# Patient Record
Sex: Female | Born: 1940 | Race: Black or African American | Hispanic: No | Smoking: Never smoker
Health system: Southern US, Community
[De-identification: ages and names within clinical notes are randomized; demographics above are authoritative.]

## PROBLEM LIST (undated history)

## (undated) DIAGNOSIS — C801 Malignant (primary) neoplasm, unspecified: Secondary | ICD-10-CM

## (undated) HISTORY — PX: COLON SURGERY: SHX602

---

## 2019-10-04 ENCOUNTER — Other Ambulatory Visit: Payer: Self-pay

## 2019-10-04 ENCOUNTER — Encounter (HOSPITAL_COMMUNITY): Payer: Self-pay | Admitting: *Deleted

## 2019-10-04 DIAGNOSIS — Z5321 Procedure and treatment not carried out due to patient leaving prior to being seen by health care provider: Secondary | ICD-10-CM | POA: Diagnosis not present

## 2019-10-04 DIAGNOSIS — R531 Weakness: Secondary | ICD-10-CM | POA: Insufficient documentation

## 2019-10-04 NOTE — ED Triage Notes (Signed)
Pt c/o weakness from her waist down, ongoing since Tuesday per pt.  Pt states she can stand for few minutes at a time before both legs give out. Pt denies any falls, states she just sits down on the floor.

## 2019-10-05 ENCOUNTER — Emergency Department (HOSPITAL_COMMUNITY): Payer: Medicare Other

## 2019-10-05 ENCOUNTER — Emergency Department (HOSPITAL_COMMUNITY)
Admission: EM | Admit: 2019-10-05 | Discharge: 2019-10-05 | Disposition: A | Discharge status: Home / Self Care | Payer: Medicare Other | Attending: Emergency Medicine | Admitting: Emergency Medicine

## 2019-10-05 ENCOUNTER — Observation Stay (HOSPITAL_COMMUNITY): Payer: Medicare Other

## 2019-10-05 ENCOUNTER — Other Ambulatory Visit: Payer: Self-pay

## 2019-10-05 ENCOUNTER — Encounter (HOSPITAL_COMMUNITY): Payer: Self-pay

## 2019-10-05 ENCOUNTER — Inpatient Hospital Stay (HOSPITAL_COMMUNITY)
Admission: EM | Admit: 2019-10-05 | Discharge: 2019-10-12 | DRG: 065 | Disposition: A | Discharge status: Rehabilitation Facility | Payer: Medicare Other | Attending: Internal Medicine | Admitting: Internal Medicine

## 2019-10-05 DIAGNOSIS — R262 Difficulty in walking, not elsewhere classified: Secondary | ICD-10-CM | POA: Diagnosis present

## 2019-10-05 DIAGNOSIS — Z9181 History of falling: Secondary | ICD-10-CM

## 2019-10-05 DIAGNOSIS — Z8673 Personal history of transient ischemic attack (TIA), and cerebral infarction without residual deficits: Secondary | ICD-10-CM

## 2019-10-05 DIAGNOSIS — R471 Dysarthria and anarthria: Secondary | ICD-10-CM | POA: Diagnosis present

## 2019-10-05 DIAGNOSIS — M4802 Spinal stenosis, cervical region: Secondary | ICD-10-CM | POA: Diagnosis present

## 2019-10-05 DIAGNOSIS — M160 Bilateral primary osteoarthritis of hip: Secondary | ICD-10-CM | POA: Diagnosis present

## 2019-10-05 DIAGNOSIS — Z85038 Personal history of other malignant neoplasm of large intestine: Secondary | ICD-10-CM

## 2019-10-05 DIAGNOSIS — F0390 Unspecified dementia without behavioral disturbance: Secondary | ICD-10-CM | POA: Diagnosis present

## 2019-10-05 DIAGNOSIS — R7303 Prediabetes: Secondary | ICD-10-CM | POA: Diagnosis present

## 2019-10-05 DIAGNOSIS — E785 Hyperlipidemia, unspecified: Secondary | ICD-10-CM | POA: Diagnosis present

## 2019-10-05 DIAGNOSIS — Z801 Family history of malignant neoplasm of trachea, bronchus and lung: Secondary | ICD-10-CM

## 2019-10-05 DIAGNOSIS — Z7982 Long term (current) use of aspirin: Secondary | ICD-10-CM

## 2019-10-05 DIAGNOSIS — I6381 Other cerebral infarction due to occlusion or stenosis of small artery: Secondary | ICD-10-CM | POA: Diagnosis not present

## 2019-10-05 DIAGNOSIS — M4854XA Collapsed vertebra, not elsewhere classified, thoracic region, initial encounter for fracture: Secondary | ICD-10-CM | POA: Diagnosis present

## 2019-10-05 DIAGNOSIS — E669 Obesity, unspecified: Secondary | ICD-10-CM | POA: Diagnosis present

## 2019-10-05 DIAGNOSIS — R29898 Other symptoms and signs involving the musculoskeletal system: Secondary | ICD-10-CM | POA: Diagnosis present

## 2019-10-05 DIAGNOSIS — I739 Peripheral vascular disease, unspecified: Secondary | ICD-10-CM | POA: Diagnosis present

## 2019-10-05 DIAGNOSIS — Z6830 Body mass index (BMI) 30.0-30.9, adult: Secondary | ICD-10-CM

## 2019-10-05 DIAGNOSIS — I10 Essential (primary) hypertension: Secondary | ICD-10-CM | POA: Diagnosis present

## 2019-10-05 DIAGNOSIS — K5900 Constipation, unspecified: Secondary | ICD-10-CM | POA: Diagnosis present

## 2019-10-05 DIAGNOSIS — M5136 Other intervertebral disc degeneration, lumbar region: Secondary | ICD-10-CM | POA: Diagnosis present

## 2019-10-05 DIAGNOSIS — Z720 Tobacco use: Secondary | ICD-10-CM

## 2019-10-05 DIAGNOSIS — R29810 Facial weakness: Secondary | ICD-10-CM | POA: Diagnosis present

## 2019-10-05 DIAGNOSIS — M47816 Spondylosis without myelopathy or radiculopathy, lumbar region: Secondary | ICD-10-CM | POA: Diagnosis present

## 2019-10-05 DIAGNOSIS — R531 Weakness: Secondary | ICD-10-CM | POA: Diagnosis present

## 2019-10-05 DIAGNOSIS — E049 Nontoxic goiter, unspecified: Secondary | ICD-10-CM | POA: Diagnosis present

## 2019-10-05 DIAGNOSIS — M48061 Spinal stenosis, lumbar region without neurogenic claudication: Secondary | ICD-10-CM | POA: Diagnosis present

## 2019-10-05 DIAGNOSIS — R52 Pain, unspecified: Secondary | ICD-10-CM

## 2019-10-05 DIAGNOSIS — R29702 NIHSS score 2: Secondary | ICD-10-CM | POA: Diagnosis present

## 2019-10-05 DIAGNOSIS — E079 Disorder of thyroid, unspecified: Secondary | ICD-10-CM

## 2019-10-05 DIAGNOSIS — I6523 Occlusion and stenosis of bilateral carotid arteries: Secondary | ICD-10-CM | POA: Diagnosis present

## 2019-10-05 DIAGNOSIS — R278 Other lack of coordination: Secondary | ICD-10-CM | POA: Diagnosis present

## 2019-10-05 DIAGNOSIS — Z20822 Contact with and (suspected) exposure to covid-19: Secondary | ICD-10-CM | POA: Diagnosis present

## 2019-10-05 HISTORY — DX: Malignant (primary) neoplasm, unspecified: C80.1

## 2019-10-05 LAB — HEPATIC FUNCTION PANEL
ALT: 16 U/L (ref 0–44)
AST: 21 U/L (ref 15–41)
Albumin: 4.1 g/dL (ref 3.5–5.0)
Alkaline Phosphatase: 67 U/L (ref 38–126)
Bilirubin, Direct: 0.1 mg/dL (ref 0.0–0.2)
Indirect Bilirubin: 0.4 mg/dL (ref 0.3–0.9)
Total Bilirubin: 0.5 mg/dL (ref 0.3–1.2)
Total Protein: 7.7 g/dL (ref 6.5–8.1)

## 2019-10-05 LAB — CBC WITH DIFFERENTIAL/PLATELET
Abs Immature Granulocytes: 0.02 10*3/uL (ref 0.00–0.07)
Basophils Absolute: 0 10*3/uL (ref 0.0–0.1)
Basophils Relative: 1 %
Eosinophils Absolute: 0.1 10*3/uL (ref 0.0–0.5)
Eosinophils Relative: 1 %
HCT: 42.2 % (ref 36.0–46.0)
Hemoglobin: 13.4 g/dL (ref 12.0–15.0)
Immature Granulocytes: 0 %
Lymphocytes Relative: 34 %
Lymphs Abs: 2.8 10*3/uL (ref 0.7–4.0)
MCH: 30.9 pg (ref 26.0–34.0)
MCHC: 31.8 g/dL (ref 30.0–36.0)
MCV: 97.5 fL (ref 80.0–100.0)
Monocytes Absolute: 0.9 10*3/uL (ref 0.1–1.0)
Monocytes Relative: 11 %
Neutro Abs: 4.5 10*3/uL (ref 1.7–7.7)
Neutrophils Relative %: 53 %
Platelets: 189 10*3/uL (ref 150–400)
RBC: 4.33 MIL/uL (ref 3.87–5.11)
RDW: 13.1 % (ref 11.5–15.5)
WBC: 8.4 10*3/uL (ref 4.0–10.5)
nRBC: 0 % (ref 0.0–0.2)

## 2019-10-05 LAB — URINALYSIS, ROUTINE W REFLEX MICROSCOPIC
Bilirubin Urine: NEGATIVE
Glucose, UA: NEGATIVE mg/dL
Hgb urine dipstick: NEGATIVE
Ketones, ur: NEGATIVE mg/dL
Leukocytes,Ua: NEGATIVE
Nitrite: NEGATIVE
Protein, ur: NEGATIVE mg/dL
Specific Gravity, Urine: 1.01 (ref 1.005–1.030)
pH: 5 (ref 5.0–8.0)

## 2019-10-05 LAB — BASIC METABOLIC PANEL
Anion gap: 11 (ref 5–15)
BUN: 14 mg/dL (ref 8–23)
CO2: 22 mmol/L (ref 22–32)
Calcium: 9.5 mg/dL (ref 8.9–10.3)
Chloride: 104 mmol/L (ref 98–111)
Creatinine, Ser: 0.93 mg/dL (ref 0.44–1.00)
GFR calc Af Amer: >60 mL/min (ref >60)
GFR calc non Af Amer: 59 mL/min — ABNORMAL LOW (ref >60)
Glucose, Bld: 95 mg/dL (ref 70–99)
Potassium: 4 mmol/L (ref 3.5–5.1)
Sodium: 137 mmol/L (ref 135–145)

## 2019-10-05 LAB — SARS CORONAVIRUS 2 BY RT PCR (HOSPITAL ORDER, PERFORMED IN ~~LOC~~ HOSPITAL LAB): SARS Coronavirus 2: NEGATIVE

## 2019-10-05 MED ORDER — SODIUM CHLORIDE 0.9% FLUSH: 3.0000 mL | INTRAVENOUS | Status: DC | PRN

## 2019-10-05 MED ORDER — SODIUM CHLORIDE 0.9 % IV SOLN: 250.0000 mL | INTRAVENOUS | Status: DC | PRN

## 2019-10-05 MED ORDER — IOHEXOL 300 MG/ML  SOLN: 100.0000 mL | Freq: Once | INTRAMUSCULAR | Status: DC | PRN

## 2019-10-05 MED ORDER — ONDANSETRON HCL 4 MG PO TABS: 4.0000 mg | ORAL_TABLET | Freq: Four times a day (QID) | ORAL | Status: DC | PRN

## 2019-10-05 MED ORDER — SODIUM CHLORIDE 0.9% FLUSH
3.0000 mL | Freq: Two times a day (BID) | INTRAVENOUS | Status: DC
  Administered 2019-10-06 – 2019-10-11 (×12): 3 mL via INTRAVENOUS

## 2019-10-05 MED ORDER — BISACODYL 10 MG RE SUPP: 10.0000 mg | Freq: Every day | RECTAL | Status: DC | PRN

## 2019-10-05 MED ORDER — ASPIRIN EC 81 MG PO TBEC
81.0000 mg | DELAYED_RELEASE_TABLET | Freq: Every day | ORAL | Status: DC
  Administered 2019-10-06 – 2019-10-12 (×7): 81 mg via ORAL
  Filled 2019-10-05 (×7): qty 1

## 2019-10-05 MED ORDER — ONDANSETRON HCL 4 MG/2ML IJ SOLN: 4.0000 mg | Freq: Four times a day (QID) | INTRAMUSCULAR | Status: DC | PRN

## 2019-10-05 MED ORDER — LACTATED RINGERS IV SOLN: INTRAVENOUS | Status: DC

## 2019-10-05 MED ORDER — ENOXAPARIN SODIUM 40 MG/0.4ML ~~LOC~~ SOLN
40.0000 mg | SUBCUTANEOUS | Status: DC
  Administered 2019-10-06 – 2019-10-11 (×6): 40 mg via SUBCUTANEOUS
  Filled 2019-10-05 (×7): qty 0.4

## 2019-10-05 MED ORDER — POLYETHYLENE GLYCOL 3350 17 G PO PACK: 17.0000 g | PACK | Freq: Every day | ORAL | Status: DC | PRN

## 2019-10-05 NOTE — Consult Note (Signed)
[facility] Tele[facility]urology Consult Services  Stat Consult  Date of Service:   10/05/2019 18:09:48  Impression:     .  R53.1 - Weakness  Comments/Sign-Out: Patient is 78yoF with no significant hx who p/w left leg weakness s/p fall. On exam her left leg is much weaker with some effort against gravity. No sensory deficits. Upper limbs appear to be intact. MRI brain negative for CVA or other central pathology. Possible lumbar spine compromise s/p fall.  CT HEAD: Showed No Acute Hemorrhage or Acute Core Infarct  Metrics: [facility] Notification Time: 10/05/2019 18:08:23 Stamp Time: 10/05/2019 18:09:48 Callback Response Time: 10/05/2019 18:15:00  Our recommendations are outlined below.  Recommendations:     Marland Kitchen  MRI Lumbar spine   Therapies:     .  Physical Therapy, Occupational Therapy, Speech Therapy Assessment When Applicable  Other WorkUp:     .  Infectious/metabolic workup per primary team     .  Check B12 level     .  Check TSH  Disposition: Neurology Follow Up Recommended  Sign Out:     .  Discussed with Emergency Department Provider  ----------------------------------------------------------------------------------------------------  Chief Complaint: Left leg weakness  History of Present Illness: Patient is a 79 year old Female.  Patient is 78yoF with no significant hx who p/w left leg weakness s/p fall. Patient reports she had a fall about two weeks ago. She recall standing in the kitchen and suddenly her legs gave out. She fell backwards on the back. She did not hit her head or had LOC. Patient reports she did not seek medical attention at that time. Tuesday morning she got out of washing her face and suddenly her leg gave out again. She reports she could not stand up. She had to call her daughter to help her. For the next day she used walker to ambulate. She came to hospital yesterday for evaluation since she felt her legs were weak , mostly  the left leg.   On my exam she is awake and alert following commands. She reports pain in her shoulder blade area but no neck pain. She denied numbness and tingling. No weakness of her arms. Patient denied similar symptoms in the past.   Anticoagulant use:  No  Antiplatelet use: ASA 81mg     Examination: BP(164/74), Pulse(64), Blood Glucose(95) 1A: Level of Consciousness - Alert; keenly responsive + 0 1B: Ask Month and Age - Both Questions Right + 0 1C: Blink Eyes & Squeeze Hands - Performs Both Tasks + 0 2: Test Horizontal Extraocular Movements - Normal + 0 3: Test Visual Fields - No Visual Loss + 0 4: Test Facial Palsy (Use Grimace if Obtunded) - Normal symmetry + 0 5A: Test Left Arm Motor Drift - No Drift for 10 Seconds + 0 5B: Test Right Arm Motor Drift - No Drift for 10 Seconds + 0 6A: Test Left Leg Motor Drift - Some Effort Against Gravity + 2 6B: Test Right Leg Motor Drift - No Drift for 5 Seconds + 0 7: Test Limb Ataxia (FNF/Heel-Shin) - No Ataxia + 0 8: Test Sensation - Normal; No sensory loss + 0 9: Test Language/Aphasia - Normal; No aphasia + 0 10: Test Dysarthria - Normal + 0 11: Test Extinction/Inattention - No abnormality + 0  NIHSS Score: 2   Patient/Family was informed the Neurology Consult would occur via TeleHealth consult by way of interactive audio and video telecommunications and consented to receiving care in this manner.  Patient is being evaluated for  possible acute neurologic impairment and high probability of imminent or life-threatening deterioration. I spent total of 35 minutes providing care to this patient, including time for face to face visit via telemedicine, review of medical records, imaging studies and discussion of findings with providers, the patient and/or family.   Dr Beryl Meager   [facility] [phone]  Case [id number]

## 2019-10-05 NOTE — ED Triage Notes (Signed)
Pt reports weakness from her waist down, ongoing since since Tuesday. Pt states her legs give out when she tries to stand and and she goes to her knees and sits on floor. No in left arm and hand which has been increasingly worse

## 2019-10-05 NOTE — H&P (Signed)
History and Physical    Patient DemographicsLori Holt ZOX:[id number] DOB: 1941/01/13 DOA: 10/05/2019  PCP: Patient, No Pcp Per  Patient coming from: Home  I have personally briefly reviewed patient's old medical records in [place]  Chief Complaint: LLE weakness   Assessment & Plan:     Assessment/Plan Principal Problem:   Weakness of left lower extremity     Principal Problem: Left lower extremity weakness Patient reports having 4-day history of worsening left lower extremity weakness with difficulty in ambulation.  She did have a fall about 2 weeks ago in the kitchen and fell backwards on her back but did not sustain any injury, loss of consciousness.  She did not seek medical attention at the time as she was able to ambulate normally following that fall until about 4 days ago.  Had another episode when her leg suddenly gave out on Tuesday morning.  No known history of lumbar spinal disease or cerebrovascular disease.  Work-up in the ER showed normal labs, normal CT of the head as well as MRI of the brain.  Patient was seen by teleneurology and was suspected to have lumbar spinal pathology and an MRI of the lumbar spine was recommended.  MRI is currently not available at this facility and patient is being transferred to [facility] when bed is available.   -Telemetry monitoring -Neuro checks every 4 hours -Will need neurology consult on transfer to [facility] -We will plan for MRI of the lumbar spine on transfer -We will obtain a CT of the lumbar and thoracic spine to rule out fractures or other pathology as patient has some tenderness in lower thoracic region  Other Active Problems: History of Colon Cancer Patient reports a history of colon cancer in the past requiring surgical resection about 5 to 6 years ago.  Did not receive any chemo or radiation.  Does not know further details about the cancer.   DVT prophylaxis: Lovenox Code Status:  Full  code Family Communication: N/A  Disposition Plan: Placed in observation for weakness, will need MRI lumbar spine   Consults called: N/A Admission status: Observation stay    HPI:     HPI: Melissa Holt is a 79 y.o. female with no significant past medical history who presented to the ER with left lower extremity weakness.  Patient reported having weakness from her waist down ongoing since Tuesday.  She had difficulty with ambulation.  Patient says she has been feeling like her legs give out when she tries to stand.  Patient did report having a fall about 2 weeks ago at home and she was standing in the kitchen when her legs gave out and she fell backwards.  No head injury, loss of consciousness reported.  No significant back pain at the time.  Patient was able to ambulate normally until about 4 days ago.  Patient was apparently noted to have some left upper extremity weakness on exam by the ER physician.  A work-up for stroke was initiated including CT of the head as well as MRI which was negative.  Patient was seen by teleneurology and was suspected to have lumbar spinal pathology and an MRI of the lumbar spine was recommended. ED Course:  Vital Signs reviewed on presentation, significant for temperature 98.7, heart rate 70, blood pressure 159/76, saturation 99% on room air. Labs reviewed, significant for sodium 137 potassium 4.0, BUN 14, creatinine 0.9, LFTs within normal limits, WBC count 8.4, globin 13.4, hematocrit 42, platelets  189.  Urinalysis is negative. Imaging personally Reviewed, CT of the head showed no acute findings.  MRI brain showed moderate atrophy with chronic microvascular ischemic change but no acute hemorrhage, stroke.  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 EKG personally reviewed, shows sinus rhythm, left anterior fascicular block.    Review of systems:    Review of Systems: As per HPI otherwise 10 point review of systems negative.  All other review of systems  is negative except the ones noted above in the HPI.    Past Medical and Surgical History:  Reviewed by me  Past Medical History:  Diagnosis Date  . Cancer [facility])     Past Surgical History:  Procedure Laterality Date  . COLON SURGERY       Social History:  Reviewed by me   reports that she has never smoked. Her smokeless tobacco use includes snuff. She reports current alcohol use. She reports that she does not use drugs.  Allergies:    No Known Allergies  Family History :   No family history on file. Family history reviewed, noted as above, not pertinent to current presentation.   Home Medications:    Prior to Admission medications   Not on File    Physical Exam:    Physical Exam: Vitals:   10/05/19 1800 10/05/19 1830 10/05/19 1900 10/05/19 2000  BP: (!) 164/74 (!) 162/117 (!) 164/80 (!) 165/88  Pulse: 64 87 64 65  Resp: 20     Temp:      SpO2: 97% 97% 97% 98%  Weight:      Height:        Constitutional: NAD, calm, comfortable Vitals:   10/05/19 1800 10/05/19 1830 10/05/19 1900 10/05/19 2000  BP: (!) 164/74 (!) 162/117 (!) 164/80 (!) 165/88  Pulse: 64 87 64 65  Resp: 20     Temp:      SpO2: 97% 97% 97% 98%  Weight:      Height:       Eyes: PERRL, lids and conjunctivae normal ENMT: Mucous membranes are moist. Posterior pharynx clear of any exudate or lesions.Normal dentition.  Neck: normal, supple, no masses, no thyromegaly Respiratory: clear to auscultation bilaterally, no wheezing, no crackles. Normal respiratory effort. No accessory muscle use.  Cardiovascular: Regular rate and rhythm, no murmurs / rubs / gallops. No extremity edema. 2+ pedal pulses. No carotid bruits.  Abdomen: no tenderness, no masses palpated. No hepatosplenomegaly. Bowel sounds positive.  Musculoskeletal: no clubbing / cyanosis. No joint deformity upper and lower extremities. Good ROM, no contractures. Normal muscle tone.  Skin: no rashes, lesions, ulcers. No  induration Neurologic: CN 2-12 grossly intact. Sensation intact, DTR normal.  Power is 3/5 in left lower extremity.  5/5 in rest of the extremities. Psychiatric: Normal judgment and insight. Alert and oriented x 3. Normal mood.    Decubitus Ulcers: Not present on admission Catheters and tubes: None  Data Review:    Labs on Admission: I have personally reviewed following labs and imaging studies  CBC: Recent Labs  Lab 10/05/19 1455  WBC 8.4  NEUTROABS 4.5  HGB 13.4  HCT 42.2  MCV 97.5  PLT 161   Basic Metabolic Panel: Recent Labs  Lab 10/05/19 1455  NA 137  K 4.0  CL 104  CO2 22  GLUCOSE 95  BUN 14  CREATININE 0.93  CALCIUM 9.5   GFR: Estimated Creatinine Clearance: 41.8 mL/min (by C-G formula based on SCr of 0.93 mg/dL). Liver Function Tests:  Recent Labs  Lab 10/05/19 1455  AST 21  ALT 16  ALKPHOS 67  BILITOT 0.5  PROT 7.7  ALBUMIN 4.1   No results for input(s): LIPASE, AMYLASE in the last 168 hours. No results for input(s): AMMONIA in the last 168 hours. Coagulation Profile: No results for input(s): INR, PROTIME in the last 168 hours. Cardiac Enzymes: No results for input(s): CKTOTAL, CKMB, CKMBINDEX, TROPONINI in the last 168 hours. BNP (last 3 results) No results for input(s): PROBNP in the last 8760 hours. HbA1C: No results for input(s): HGBA1C in the last 72 hours. CBG: No results for input(s): GLUCAP in the last 168 hours. Lipid Profile: No results for input(s): CHOL, HDL, LDLCALC, TRIG, CHOLHDL, LDLDIRECT in the last 72 hours. Thyroid Function Tests: No results for input(s): TSH, T4TOTAL, FREET4, T3FREE, THYROIDAB in the last 72 hours. Anemia Panel: No results for input(s): VITAMINB12, FOLATE, FERRITIN, TIBC, IRON, RETICCTPCT in the last 72 hours. Urine analysis:    Component Value Date/Time   COLORURINE YELLOW 10/05/2019 1600   APPEARANCEUR CLEAR 10/05/2019 1600   LABSPEC 1.010 10/05/2019 1600   PHURINE 5.0 10/05/2019 1600   GLUCOSEU  NEGATIVE 10/05/2019 1600   HGBUR NEGATIVE 10/05/2019 1600   BILIRUBINUR NEGATIVE 10/05/2019 1600   KETONESUR NEGATIVE 10/05/2019 1600   PROTEINUR NEGATIVE 10/05/2019 1600   NITRITE NEGATIVE 10/05/2019 1600   LEUKOCYTESUR NEGATIVE 10/05/2019 1600     Imaging Results:      Radiological Exams on Admission: CT Head Wo Contrast  Result Date: 10/05/2019 CLINICAL DATA:  Weakness from the waist down beginning 3 days ago. EXAM: CT HEAD WITHOUT CONTRAST TECHNIQUE: Contiguous axial images were obtained from the base of the skull through the vertex without intravenous contrast. COMPARISON:  None. FINDINGS: Brain: Age related atrophy. Chronic small-vessel ischemic changes of the cerebral hemispheric white matter. No sign of acute infarction, mass lesion, hemorrhage, hydrocephalus or extra-axial collection. Vascular: There is atherosclerotic calcification of the major vessels at the base of the brain. Skull: Negative Sinuses/Orbits: Clear/normal Other: None IMPRESSION: No acute finding by CT. Age related atrophy and chronic small-vessel ischemic changes of the white matter. Electronically Signed   By: Nelson Chimes M.D.   On: 10/05/2019 16:22   MR BRAIN WO CONTRAST  Result Date: 10/05/2019 CLINICAL DATA:  Acute neuro deficit.  Bilateral leg weakness. EXAM: MRI HEAD WITHOUT CONTRAST TECHNIQUE: Multiplanar, multiecho pulse sequences of the brain and surrounding structures were obtained without intravenous contrast. COMPARISON:  CT head 10/05/2019 FINDINGS: Brain: Moderate atrophy. Ventricular enlargement consistent with atrophy. Negative for acute infarct. Moderate chronic microvascular ischemic changes in the white matter. 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 Negative for hemorrhage or mass. Vascular: Loss of flow void distal right vertebral artery which may be small or occluded. Otherwise normal arterial flow voids Skull and upper cervical spine: No focal skeletal lesion. Degenerative changes in the  cervical spine. Sinuses/Orbits: Mild mucosal edema paranasal sinuses. Mastoid clear bilaterally Negative orbit Other: None IMPRESSION: No acute abnormality Moderate atrophy and moderate chronic microvascular ischemic changes. Electronically Signed   By: Franchot Gallo M.D.   On: 10/05/2019 17:28      Arnold Depinto Ginette Otto MD [facility]  If 7PM-7AM, please contact night-coverage   10/05/2019, 8:25 PM

## 2019-10-05 NOTE — ED Provider Notes (Signed)
[facility] EMERGENCY DEPARTMENT Provider Note   CSN: [id number] Arrival date & time: 10/05/19  1126     History Chief Complaint  Patient presents with  . Extremity Weakness    Melissa Holt is a 79 y.o. female.  On Tuesday the patient states she fell and was weak in her left leg.  She also has some weakness in her left arm  The history is provided by the patient, medical records and a significant other. No language interpreter was used.  Extremity Weakness This is a new problem. The current episode started more than 2 days ago. The problem occurs constantly. The problem has not changed since onset.Pertinent negatives include no chest pain, no abdominal pain and no headaches. Nothing aggravates the symptoms. Nothing relieves the symptoms. She has tried nothing for the symptoms.       Past Medical History:  Diagnosis Date  . Cancer ([place])     There are no problems to display for this patient.   Past Surgical History:  Procedure Laterality Date  . COLON SURGERY       OB History   No obstetric history on file.     No family history on file.  Social History   Tobacco Use  . Smoking status: Never Smoker  . Smokeless tobacco: Current User    Types: Snuff  Substance Use Topics  . Alcohol use: Yes  . Drug use: Never    Home Medications Prior to Admission medications   Not on File    Allergies    Patient has no known allergies.  Review of Systems   Review of Systems  Constitutional: Negative for appetite change and fatigue.  HENT: Negative for congestion, ear discharge and sinus pressure.   Eyes: Negative for discharge.  Respiratory: Negative for cough.   Cardiovascular: Negative for chest pain.  Gastrointestinal: Negative for abdominal pain and diarrhea.  Genitourinary: Negative for frequency and hematuria.  Musculoskeletal: Positive for extremity weakness. Negative for back pain.       Weakness left leg left arm  Skin: Negative for rash.    Neurological: Negative for seizures and headaches.  Psychiatric/Behavioral: Negative for hallucinations.    Physical Exam Updated Vital Signs BP (!) 165/88   Pulse 65   Temp 98.7 F (37.1 C)   Resp 20   Ht 4\' 11"  (1.499 m)   Wt 68 kg   SpO2 98%   BMI 30.28 kg/m   Physical Exam Vitals and nursing note reviewed.  Constitutional:      Appearance: She is well-developed.  HENT:     Head: Normocephalic.     Nose: Nose normal.  Eyes:     General: No scleral icterus.    Conjunctiva/sclera: Conjunctivae normal.  Neck:     Thyroid: No thyromegaly.  Cardiovascular:     Rate and Rhythm: Normal rate and regular rhythm.     Heart sounds: No murmur heard.  No friction rub. No gallop.   Pulmonary:     Breath sounds: No stridor. No wheezing or rales.  Chest:     Chest wall: No tenderness.  Abdominal:     General: There is no distension.     Tenderness: There is no abdominal tenderness. There is no rebound.  Musculoskeletal:        General: Normal range of motion.     Cervical back: Neck supple.     Comments: Patient has profound weakness to left leg and minimal dexterity problems to left hand and arm  Lymphadenopathy:     Cervical: No cervical adenopathy.  Skin:    Findings: No erythema or rash.  Neurological:     Mental Status: She is alert and oriented to person, place, and time.     Motor: No abnormal muscle tone.     Coordination: Coordination normal.  Psychiatric:        Behavior: Behavior normal.     ED Results / Procedures / Treatments   Labs (all labs ordered are listed, but only abnormal results are displayed) Labs Reviewed  BASIC METABOLIC PANEL - Abnormal; Notable for the following components:      Result Value   GFR calc non Af Amer 59 (*)    All other components within normal limits  SARS CORONAVIRUS 2 BY RT PCR (HOSPITAL ORDER, [place] [facility])  CBC WITH DIFFERENTIAL/PLATELET  URINALYSIS, ROUTINE W REFLEX MICROSCOPIC  HEPATIC  FUNCTION PANEL    EKG None  Radiology CT Head Wo Contrast  Result Date: 10/05/2019 CLINICAL DATA:  Weakness from the waist down beginning 3 days ago. EXAM: CT HEAD WITHOUT CONTRAST TECHNIQUE: Contiguous axial images were obtained from the base of the skull through the vertex without intravenous contrast. COMPARISON:  None. FINDINGS: Brain: Age related atrophy. Chronic small-vessel ischemic changes of the cerebral hemispheric white matter. No sign of acute infarction, mass lesion, hemorrhage, hydrocephalus or extra-axial collection. Vascular: There is atherosclerotic calcification of the major vessels at the base of the brain. Skull: Negative Sinuses/Orbits: Clear/normal Other: None IMPRESSION: No acute finding by CT. Age related atrophy and chronic small-vessel ischemic changes of the white matter. Electronically Signed   By: Nelson Chimes M.D.   On: 10/05/2019 16:22   MR BRAIN WO CONTRAST  Result Date: 10/05/2019 CLINICAL DATA:  Acute neuro deficit.  Bilateral leg weakness. EXAM: MRI HEAD WITHOUT CONTRAST TECHNIQUE: Multiplanar, multiecho pulse sequences of the brain and surrounding structures were obtained without intravenous contrast. COMPARISON:  CT head 10/05/2019 FINDINGS: Brain: Moderate atrophy. Ventricular enlargement consistent with atrophy. Negative for acute infarct. Moderate chronic microvascular ischemic changes in the white matter. 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 Negative for hemorrhage or mass. Vascular: Loss of flow void distal right vertebral artery which may be small or occluded. Otherwise normal arterial flow voids Skull and upper cervical spine: No focal skeletal lesion. Degenerative changes in the cervical spine. Sinuses/Orbits: Mild mucosal edema paranasal sinuses. Mastoid clear bilaterally Negative orbit Other: None IMPRESSION: No acute abnormality Moderate atrophy and moderate chronic microvascular ischemic changes. Electronically Signed   By: Franchot Gallo M.D.    On: 10/05/2019 17:28    Procedures Procedures (including critical care time)  Medications Ordered in ED Medications - No data to display  ED Course  I have reviewed the triage vital signs and the nursing notes.  Pertinent labs & imaging results that were available during my care of the patient were reviewed by me and considered in my medical decision making (see chart for details). CRITICAL CARE Performed by: Milton Ferguson Total critical care time: 40 minutes Critical care time was exclusive of separately billable procedures and treating other patients. Critical care was necessary to treat or prevent imminent or life-threatening deterioration. Critical care was time spent personally by me on the following activities: development of treatment plan with patient and/or surrogate as well as nursing, discussions with consultants, evaluation of patient's response to treatment, examination of patient, obtaining history from patient or surrogate, ordering and performing treatments and interventions, ordering and review of laboratory  studies, ordering and review of radiographic studies, pulse oximetry and re-evaluation of patient's condition.   Patient seen by neurology.  They recommend MRI lumbar spine and admission MDM Rules/Calculators/A&P                         Patient with weakness to left leg unknown cause.  She will be admitted and get an MRI of lumbar spine       This patient presents to the ED for concern of left leg weakness, this involves an extensive number of treatment options, and is a complaint that carries with it a high risk of complications and morbidity.  The differential diagnosis includes stroke tumor in spine   Lab Tests:   I Ordered, reviewed, and interpreted labs, which included CBC and chemistries unremarkable  Medicines ordered:   I ordered medication normal saline for dehydration  Imaging Studies ordered:   I ordered imaging studies which included CT  head and MRI head  I independently visualized and interpreted imaging which showed unremarkable  Additional history obtained:   Additional history obtained from family  Previous records obtained and reviewed.  Consultations Obtained:   I consulted neurology and hospitalist and discussed lab and imaging findings  Reevaluation:  After the interventions stated above, I reevaluated the patient and found no change  Critical Interventions:  .    Final Clinical Impression(s) / ED Diagnoses Final diagnoses:  None    Rx / DC Orders ED Discharge Orders    None       Milton Ferguson, MD 10/05/19 2026

## 2019-10-06 ENCOUNTER — Encounter (HOSPITAL_COMMUNITY): Payer: Self-pay | Admitting: Internal Medicine

## 2019-10-06 ENCOUNTER — Observation Stay (HOSPITAL_COMMUNITY): Payer: Medicare Other

## 2019-10-06 DIAGNOSIS — M4802 Spinal stenosis, cervical region: Secondary | ICD-10-CM | POA: Diagnosis not present

## 2019-10-06 DIAGNOSIS — R29810 Facial weakness: Secondary | ICD-10-CM | POA: Diagnosis not present

## 2019-10-06 DIAGNOSIS — M4854XA Collapsed vertebra, not elsewhere classified, thoracic region, initial encounter for fracture: Secondary | ICD-10-CM | POA: Diagnosis not present

## 2019-10-06 DIAGNOSIS — I6523 Occlusion and stenosis of bilateral carotid arteries: Secondary | ICD-10-CM | POA: Diagnosis not present

## 2019-10-06 DIAGNOSIS — R471 Dysarthria and anarthria: Secondary | ICD-10-CM | POA: Diagnosis not present

## 2019-10-06 DIAGNOSIS — M47816 Spondylosis without myelopathy or radiculopathy, lumbar region: Secondary | ICD-10-CM | POA: Diagnosis not present

## 2019-10-06 DIAGNOSIS — E785 Hyperlipidemia, unspecified: Secondary | ICD-10-CM | POA: Diagnosis not present

## 2019-10-06 DIAGNOSIS — M48061 Spinal stenosis, lumbar region without neurogenic claudication: Secondary | ICD-10-CM | POA: Diagnosis not present

## 2019-10-06 DIAGNOSIS — I340 Nonrheumatic mitral (valve) insufficiency: Secondary | ICD-10-CM

## 2019-10-06 DIAGNOSIS — K5900 Constipation, unspecified: Secondary | ICD-10-CM | POA: Diagnosis not present

## 2019-10-06 DIAGNOSIS — Z85038 Personal history of other malignant neoplasm of large intestine: Secondary | ICD-10-CM | POA: Diagnosis not present

## 2019-10-06 DIAGNOSIS — Z720 Tobacco use: Secondary | ICD-10-CM | POA: Diagnosis not present

## 2019-10-06 DIAGNOSIS — I6381 Other cerebral infarction due to occlusion or stenosis of small artery: Secondary | ICD-10-CM | POA: Diagnosis not present

## 2019-10-06 DIAGNOSIS — R29898 Other symptoms and signs involving the musculoskeletal system: Secondary | ICD-10-CM | POA: Diagnosis not present

## 2019-10-06 DIAGNOSIS — Z20822 Contact with and (suspected) exposure to covid-19: Secondary | ICD-10-CM | POA: Diagnosis not present

## 2019-10-06 DIAGNOSIS — R7303 Prediabetes: Secondary | ICD-10-CM | POA: Diagnosis not present

## 2019-10-06 DIAGNOSIS — E669 Obesity, unspecified: Secondary | ICD-10-CM | POA: Diagnosis not present

## 2019-10-06 DIAGNOSIS — Z6830 Body mass index (BMI) 30.0-30.9, adult: Secondary | ICD-10-CM | POA: Diagnosis not present

## 2019-10-06 DIAGNOSIS — F0390 Unspecified dementia without behavioral disturbance: Secondary | ICD-10-CM | POA: Diagnosis not present

## 2019-10-06 DIAGNOSIS — R29702 NIHSS score 2: Secondary | ICD-10-CM | POA: Diagnosis not present

## 2019-10-06 DIAGNOSIS — R531 Weakness: Secondary | ICD-10-CM | POA: Diagnosis present

## 2019-10-06 DIAGNOSIS — M160 Bilateral primary osteoarthritis of hip: Secondary | ICD-10-CM | POA: Diagnosis not present

## 2019-10-06 DIAGNOSIS — I739 Peripheral vascular disease, unspecified: Secondary | ICD-10-CM | POA: Diagnosis not present

## 2019-10-06 DIAGNOSIS — M5136 Other intervertebral disc degeneration, lumbar region: Secondary | ICD-10-CM | POA: Diagnosis not present

## 2019-10-06 DIAGNOSIS — I10 Essential (primary) hypertension: Secondary | ICD-10-CM | POA: Diagnosis not present

## 2019-10-06 DIAGNOSIS — E049 Nontoxic goiter, unspecified: Secondary | ICD-10-CM | POA: Diagnosis not present

## 2019-10-06 LAB — LIPID PANEL
Cholesterol: 272 mg/dL — ABNORMAL HIGH (ref 0–200)
HDL: 67 mg/dL (ref >40)
LDL Cholesterol: 186 mg/dL — ABNORMAL HIGH (ref 0–99)
Total CHOL/HDL Ratio: 4.1 RATIO
Triglycerides: 93 mg/dL (ref <150)
VLDL: 19 mg/dL (ref 0–40)

## 2019-10-06 LAB — ECHOCARDIOGRAM COMPLETE
Area-P 1/2: 3.48 cm2
Height: 59 in
S' Lateral: 3 cm
Weight: 2398.6 oz

## 2019-10-06 MED ORDER — AMLODIPINE BESYLATE 5 MG PO TABS
5.0000 mg | ORAL_TABLET | Freq: Every day | ORAL | Status: DC
  Administered 2019-10-06 – 2019-10-12 (×7): 5 mg via ORAL
  Filled 2019-10-06 (×7): qty 1

## 2019-10-06 MED ORDER — LORAZEPAM 1 MG PO TABS: 1.0000 mg | ORAL_TABLET | ORAL | Status: DC | PRN

## 2019-10-06 MED ORDER — IOHEXOL 300 MG/ML  SOLN
100.0000 mL | Freq: Once | INTRAMUSCULAR | Status: AC | PRN
  Administered 2019-10-06: 100 mL via INTRAVENOUS

## 2019-10-06 MED ORDER — LABETALOL HCL 5 MG/ML IV SOLN: 10.0000 mg | INTRAVENOUS | Status: DC | PRN

## 2019-10-06 MED ORDER — ATORVASTATIN CALCIUM 10 MG PO TABS
20.0000 mg | ORAL_TABLET | Freq: Every day | ORAL | Status: DC
  Administered 2019-10-06 – 2019-10-08 (×3): 20 mg via ORAL
  Filled 2019-10-06 (×3): qty 2

## 2019-10-06 NOTE — Progress Notes (Signed)
  Echocardiogram 2D Echocardiogram has been performed.  Johny Chess 10/06/2019, 4:41 PM

## 2019-10-06 NOTE — Progress Notes (Signed)
Notified Dr. Myna Hidalgo of BP

## 2019-10-06 NOTE — Consult Note (Addendum)
Neurology Consultation  Reason for Consult: Left lower extremity weakness   Referring Physician: Dr. Benny Lennert   CC: Left lower extremity weakness   History is obtained from: Patient and chart   HPI: Melissa Holt is a 79 y.o. female with a past medical history of colon cancer approximately 5 years ago.  She states that on Tuesday she was walking out of the bathroom and her leg suddenly gave out - she landed on her butt in a somewhat seated position. She denies any LOC or dizziness and did not hit her head. Also with no numbness or tingling prior to or after the episode. She states she was unable to get up, needing assistance to do so, and since that time has needed to use a walker.  She also endorses having a prior fall ~ 2 weeks ago but was able to ambulate fine until the fall this past Tuesday.  She endorses having some low back pain.    CT head showed no acute abnormality. MRI head also with no acute abnormality. CT lumbar spine with mild superior endplate deformity at G81. CT thoracic spine with no evidence for acute osseous abnormality.    She denies any recent illness, fevers, chills, N/V/D.  Received COVID vaccine in May and no others since. She also denies CP, SOB, headache, numbness, tingling, vision abnormalities or speech deficits.    ROS: A 14 point ROS was performed and is negative except as noted in the HPI.   Past Medical History:  Diagnosis Date  . Cancer [facility])     No family history on file.   Social History:   reports that she has never smoked. Her smokeless tobacco use includes snuff. She reports current alcohol use. She reports that she does not use drugs.  Medications  Current Facility-Administered Medications:  .  0.9 %  sodium chloride infusion, 250 mL, Intravenous, PRN, Lynetta Mare, MD .  aspirin EC tablet 81 mg, 81 mg, Oral, Daily, Lynetta Mare, MD, 81 mg at 10/06/19 0927 .  bisacodyl (DULCOLAX) suppository 10 mg, 10 mg, Rectal, Daily PRN, Lynetta Mare, MD .  enoxaparin (LOVENOX) injection 40 mg, 40 mg, Subcutaneous, Q24H, Lynetta Mare, MD .  lactated ringers infusion, , Intravenous, Continuous, Lynetta Mare, MD, Last Rate: 75 mL/hr at 10/05/19 2216, New Bag at 10/05/19 2216 .  ondansetron (ZOFRAN) tablet 4 mg, 4 mg, Oral, Q6H PRN **OR** ondansetron (ZOFRAN) injection 4 mg, 4 mg, Intravenous, Q6H PRN, Lynetta Mare, MD .  polyethylene glycol (MIRALAX / GLYCOLAX) packet 17 g, 17 g, Oral, Daily PRN, Lynetta Mare, MD .  sodium chloride flush (NS) 0.9 % injection 3 mL, 3 mL, Intravenous, Q12H, Lynetta Mare, MD, 3 mL at 10/06/19 0932 .  sodium chloride flush (NS) 0.9 % injection 3 mL, 3 mL, Intravenous, PRN, Lynetta Mare, MD   Exam: Current vital signs: BP (!) 153/74 (BP Location: Left Arm)   Pulse 75   Temp 97.8 F (36.6 C) (Oral)   Resp 17   Ht 4\' 11"  (1.499 m)   Wt 68 kg   SpO2 96%   BMI 30.28 kg/m  Vital signs in last 24 hours: Temp:  [97.8 F (36.6 C)-98.7 F (37.1 C)] 97.8 F (36.6 C) (08/28 0350) Pulse Rate:  [63-87] 75 (08/28 0350) Resp:  [12-27] 17 (08/28 0350) BP: (103-165)/(62-117) 153/74 (08/28 0350) SpO2:  [96 %-99 %] 96 % (08/28 0350) FiO2 (%):  [21 %] 21 % (08/27 2118)  Weight:  [68 kg] 68 kg (08/27 1220)  GENERAL: Awake, alert in NAD HENT: - Normocephalic and atraumatic, dry mm, no Thyromegally Eyes: normal conjunctiva  LUNGS - Clear to auscultation bilaterally with no wheezes CV - S1S2 RRR, no m/r/g, equal pulses bilaterally. ABDOMEN - Soft, nontender, nondistended with normoactive BS Ext: warm, well perfused, intact peripheral pulses, no edema  NEURO:  Mental Status: AA&Ox3. Speech is dysarthric.  Naming, repetition, fluency, and comprehension intact. Cranial Nerves: PERRL 3 mm/brisk. EOMI, visual fields full, no facial asymmetry, facial sensation intact, hearing intact, tongue/uvula/soft palate midline, normal sternocleidomastoid and trapezius muscle strength. No  evidence of tongue atrophy or fasciculations.  Motor: 5/5 right upper and right lower, 4+/5 left upper extremity.  LLE: 3/5 hip flexion and knee flexion, 4+/5 knee extension, 5/5 ADF  Tone is normal and bulk is normal. Sensation: Intact to light touch bilaterally Deep Tendon Reflexes: Right - 2+ bicep, brachioradialis, tricep, patellar and ankle Left -  2+ bicep, brachioradialis, tricep, 1+ patellar and ankle Coordination: Dysmetria and dyssinergia in left arm  Gait- Deferred  NIHSS 1a Level of Conscious.: 0 1b LOC Questions: 0 1c LOC Commands: 0 2 Best Gaze: 0 3 Visual: 0 4 Facial Palsy: 0 5a Motor Arm - left: 0 5b Motor Arm - Right: 0 6a Motor Leg - Left: 2 6b Motor Leg - Right:  7 Limb Ataxia: 1 8 Sensory: 0 9 Best Language: 0 10 Dysarthria: 1 11 Extinct. and Inatten.: 4 TOTAL: 4  Labs I have reviewed labs in epic and the results pertinent to this consultation are:  CBC    Component Value Date/Time   WBC 8.4 10/05/2019 1455   RBC 4.33 10/05/2019 1455   HGB 13.4 10/05/2019 1455   HCT 42.2 10/05/2019 1455   PLT 189 10/05/2019 1455   MCV 97.5 10/05/2019 1455   MCH 30.9 10/05/2019 1455   MCHC 31.8 10/05/2019 1455   RDW 13.1 10/05/2019 1455   LYMPHSABS 2.8 10/05/2019 1455   MONOABS 0.9 10/05/2019 1455   EOSABS 0.1 10/05/2019 1455   BASOSABS 0.0 10/05/2019 1455    CMP     Component Value Date/Time   NA 137 10/05/2019 1455   K 4.0 10/05/2019 1455   CL 104 10/05/2019 1455   CO2 22 10/05/2019 1455   GLUCOSE 95 10/05/2019 1455   BUN 14 10/05/2019 1455   CREATININE 0.93 10/05/2019 1455   CALCIUM 9.5 10/05/2019 1455   PROT 7.7 10/05/2019 1455   ALBUMIN 4.1 10/05/2019 1455   AST 21 10/05/2019 1455   ALT 16 10/05/2019 1455   ALKPHOS 67 10/05/2019 1455   BILITOT 0.5 10/05/2019 1455   GFRNONAA 59 (L) 10/05/2019 1455   GFRAA >60 10/05/2019 1455    Lipid Panel  No results found for: CHOL, TRIG, HDL, CHOLHDL, VLDL, LDLCALC, LDLDIRECT   Imaging I have  reviewed the images obtained:  CT of the brain:: no acute abnormality   CT lumbar spine: Age indeterminate mild superior endplate deformity at N36, Mild degenerative changes of the lumbar spine   CT thoracic spine No CT evidence for acute osseous abnormality of the thoracic Spine. Diffuse degenerative osteophytes throughout the thoracic spine. Heterogenous enlarged right lobe of thyroid gland.  MRI examination of the brain: No acute abnormality. Moderate atrophy and moderate chronic microvascular ischemic changes are noted.  Assessment: 79 year old female with a history of colon cancer ~5 years ago.  She states that on Tuesday she was walking out of the bathroom, her legs suddenly  gave out and she landed on her bottom in a somewhat seated position. She denies any LOC or hitting her head, as well as dizziness, numbness or tingling prior to or after the episode.  She states she was unable to get up and needed assistance and since this time has needed to use a walker. She endorses low back pain with no radiculopathy. 1. Exam reveals significant weakness of hip flexion (L2, L3) and knee flexion (L5, S1), with slight weakness of knee extension (L3, L4), full strength of hip extension (L4, L5) and normal ankle dorsiflexion (L3, L4). Given that the nerve roots subserving hip flexion and knee flexion are separated by an intervening nerve root, in addition to lumbar spine CT with no gross radiculopathic findings, nerve root compression is felt to be unlikely. A left lumbosacral plexus injury is possible. MRI brain has ruled out stroke.  2. Presence of brisk reflexes as well as the clinical history with no sensory numbness as well as asymmetric lower extremity weakness militate against GBS.    Recommendations: -- MRI lumbar spine  -- MRI thoracic spine -- MRI of left lumbosacral plexus with and without contrast. -- Continue ASA   Beulah Gandy, NP  I have seen and examined the patient. I have  formulated the assessment and recommendations. 79 year old female with acute onset of LLE weakness associated with a fall 4 days ago. Exam as above. Recommendations include MRI of lumbar spine and left lumbosacral plexus.  Electronically signed: Dr. Kerney Elbe   Addendum: -- MRI of thoracic and lumbar spine completed. Report conclusions do not reveal a Neurological etiology for her weakness. However, the endplate fracture described below is most likely contributing significantly to decreased LLE movement due to pain:  -- Mild superior endplate fracture of B52 with bone marrow edema consistent with acute fracture. Negative for spinal stenosis. No other fracture  -- 3 mm anterolisthesis L5-S1 with severe facet degeneration and moderate subarticular stenosis bilaterally. Mild subarticular stenosis bilaterally at L4-5 -- MRI of left lumbosacral plexus with and without contrast has been ordered.    Electronically signed: Dr. Kerney Elbe

## 2019-10-06 NOTE — Care Management Obs Status (Signed)
[facility] NOTIFICATION   Patient Details  Name: Melissa Holt MRN: [id number] Date of Birth: [birth year]   Medicare Observation Status Notification Given:  Yes    Claudie Leach, RN 10/06/2019, 6:35 PM

## 2019-10-06 NOTE — Progress Notes (Signed)
PROGRESS NOTE  Melissa Holt VVO:[id number] DOB: 1940/10/04 DOA: 10/05/2019 PCP: Patient, No Pcp Per  Brief History   Patient reports having 4-day history of worsening left lower extremity weakness with difficulty in ambulation.  She did have a fall about 2 weeks ago in the kitchen and fell backwards on her back but did not sustain any injury, loss of consciousness.  She did not seek medical attention at the time as she was able to ambulate normally following that fall until about 4 days ago.  Had another episode when her leg suddenly gave out on Tuesday morning.  No known history of lumbar spinal disease or cerebrovascular disease.  Work-up in the ER showed normal labs, normal CT of the head as well as MRI of the brain.  Patient was seen by teleneurology and was suspected to have lumbar spinal pathology and an MRI of the lumbar spine was recommended.  MRI is currently not available at this facility and patient is being transferred to [facility] when bed is available.    The patient was transferred to [facility] to a telemetry bed. CT head and MRI brain are negative for acute CVA. CT of the lumbar and thoracic spine demonstrated an age-indeterminate mild superior endplate deformity at Y69 without retropulsion. There were mild degenerative changes of the lumbar spine without high grade canal or foraminal stenosis. MRI fo the thoracic and lumbar spine are pending. Neurology has been consulted as has PT/OT.  Consultants  . Neurology  Procedures  . None  Antibiotics   Anti-infectives (From admission, onward)   None    .  Subjective  The patient is resting quietly. No new complaints.  Objective   Vitals:  Vitals:   10/06/19 0350 10/06/19 1128  BP: (!) 153/74 (!) 167/80  Pulse: 75   Resp: 17 (!) 25  Temp: 97.8 F (36.6 C) 97.7 F (36.5 C)  SpO2: 96% 97%   Exam:  Constitutional:  . The patient is awake, alert, and oriented x 3. No acute distress. Eyes:  . pupils and irises appear  normal . Normal lids and conjunctivae ENMT:  . grossly normal hearing  . Lips appear normal . external ears, nose appear normal . Oropharynx: mucosa, tongue,posterior pharynx appear normal Neck:  . neck appears normal, no masses, normal ROM, supple . no thyromegaly Respiratory:  . No increased work of breathing. . No wheezes, rales, or rhonchi . No tactile fremitus Cardiovascular:  . Regular rate and rhythm . No murmurs, ectopy, or gallups. . No lateral PMI. No thrills. Abdomen:  . Abdomen is soft, non-tender, non-distended . No hernias, masses, or organomegaly . Normoactive bowel sounds.  Musculoskeletal:  . No cyanosis, clubbing, or edema Skin:  . No rashes, lesions, ulcers . palpation of skin: no induration or nodules Neurologic:  . CN 2-12 intact . Sensation all 4 extremities intact Psychiatric:  . Mental status o Mood, affect appropriate o Orientation to person, place, time  . judgment and insight appear intact   I have personally reviewed the following:   Today's Data  . Vitals  Imaging  . CT Lumbar and Thoracic Spine . CT brain . MRI brain  Cardiology Data  . EKG  Scheduled Meds: . aspirin EC  81 mg Oral Daily  . enoxaparin (LOVENOX) injection  40 mg Subcutaneous Q24H  . sodium chloride flush  3 mL Intravenous Q12H   Continuous Infusions: . sodium chloride    . lactated ringers 75 mL/hr at 10/05/19 2216    Principal  Problem:   Weakness of left lower extremity Active Problems:   Left leg weakness   LOS: 0 days   A & P  Left Lower extremity weakness: CT and MRI brain negative for acute pathology. Neurology has recommended a stroke work up. We will do that. CT of lumbar and thoracic spine were negative. MRI lumbar and thoracic spine is pending. Echocardiogram, Carotid doppler, and lipid panel has been ordered. Will start on ASA.  Hypertension: Not on any medications at home. As MRI is negative will start patient on norvasc.  Prolonged QTc:  Avoid QT prolonging medications. Check chemistry. Recheck EKG.   Ambulatory dysfunction: PT/OT eval and treat.  Advanced age: noted.  I have seen and examined this patient myself. I have spent 36 minutes in her evaluation and care.  Status is: Inpatient.   Sayde Lish, DO [facility] Direct contact: see [email or web address]  7PM-7AM contact night coverage as above 10/06/2019, 1:16 PM  LOS: 0 days

## 2019-10-07 ENCOUNTER — Observation Stay (HOSPITAL_COMMUNITY): Payer: Medicare Other

## 2019-10-07 DIAGNOSIS — E049 Nontoxic goiter, unspecified: Secondary | ICD-10-CM | POA: Diagnosis present

## 2019-10-07 DIAGNOSIS — T380X5A Adverse effect of glucocorticoids and synthetic analogues, initial encounter: Secondary | ICD-10-CM | POA: Diagnosis not present

## 2019-10-07 DIAGNOSIS — R29810 Facial weakness: Secondary | ICD-10-CM | POA: Diagnosis present

## 2019-10-07 DIAGNOSIS — R7309 Other abnormal glucose: Secondary | ICD-10-CM | POA: Diagnosis not present

## 2019-10-07 DIAGNOSIS — M4802 Spinal stenosis, cervical region: Secondary | ICD-10-CM | POA: Diagnosis present

## 2019-10-07 DIAGNOSIS — R531 Weakness: Secondary | ICD-10-CM | POA: Diagnosis present

## 2019-10-07 DIAGNOSIS — Z6830 Body mass index (BMI) 30.0-30.9, adult: Secondary | ICD-10-CM | POA: Diagnosis not present

## 2019-10-07 DIAGNOSIS — Z720 Tobacco use: Secondary | ICD-10-CM | POA: Diagnosis not present

## 2019-10-07 DIAGNOSIS — M48061 Spinal stenosis, lumbar region without neurogenic claudication: Secondary | ICD-10-CM | POA: Diagnosis present

## 2019-10-07 DIAGNOSIS — I63521 Cerebral infarction due to unspecified occlusion or stenosis of right anterior cerebral artery: Secondary | ICD-10-CM | POA: Diagnosis not present

## 2019-10-07 DIAGNOSIS — R55 Syncope and collapse: Secondary | ICD-10-CM | POA: Diagnosis not present

## 2019-10-07 DIAGNOSIS — I6350 Cerebral infarction due to unspecified occlusion or stenosis of unspecified cerebral artery: Secondary | ICD-10-CM | POA: Diagnosis not present

## 2019-10-07 DIAGNOSIS — I6523 Occlusion and stenosis of bilateral carotid arteries: Secondary | ICD-10-CM | POA: Diagnosis present

## 2019-10-07 DIAGNOSIS — E669 Obesity, unspecified: Secondary | ICD-10-CM | POA: Diagnosis present

## 2019-10-07 DIAGNOSIS — K5900 Constipation, unspecified: Secondary | ICD-10-CM | POA: Diagnosis present

## 2019-10-07 DIAGNOSIS — R7303 Prediabetes: Secondary | ICD-10-CM | POA: Diagnosis present

## 2019-10-07 DIAGNOSIS — I6389 Other cerebral infarction: Secondary | ICD-10-CM | POA: Diagnosis not present

## 2019-10-07 DIAGNOSIS — M160 Bilateral primary osteoarthritis of hip: Secondary | ICD-10-CM | POA: Diagnosis present

## 2019-10-07 DIAGNOSIS — I6612 Occlusion and stenosis of left anterior cerebral artery: Secondary | ICD-10-CM | POA: Diagnosis not present

## 2019-10-07 DIAGNOSIS — I739 Peripheral vascular disease, unspecified: Secondary | ICD-10-CM | POA: Diagnosis present

## 2019-10-07 DIAGNOSIS — I10 Essential (primary) hypertension: Secondary | ICD-10-CM | POA: Diagnosis present

## 2019-10-07 DIAGNOSIS — I639 Cerebral infarction, unspecified: Secondary | ICD-10-CM | POA: Diagnosis not present

## 2019-10-07 DIAGNOSIS — M4854XA Collapsed vertebra, not elsewhere classified, thoracic region, initial encounter for fracture: Secondary | ICD-10-CM | POA: Diagnosis present

## 2019-10-07 DIAGNOSIS — Z85038 Personal history of other malignant neoplasm of large intestine: Secondary | ICD-10-CM | POA: Diagnosis not present

## 2019-10-07 DIAGNOSIS — K219 Gastro-esophageal reflux disease without esophagitis: Secondary | ICD-10-CM | POA: Diagnosis not present

## 2019-10-07 DIAGNOSIS — M79672 Pain in left foot: Secondary | ICD-10-CM | POA: Diagnosis not present

## 2019-10-07 DIAGNOSIS — R471 Dysarthria and anarthria: Secondary | ICD-10-CM | POA: Diagnosis present

## 2019-10-07 DIAGNOSIS — M5136 Other intervertebral disc degeneration, lumbar region: Secondary | ICD-10-CM | POA: Diagnosis present

## 2019-10-07 DIAGNOSIS — M47816 Spondylosis without myelopathy or radiculopathy, lumbar region: Secondary | ICD-10-CM | POA: Diagnosis present

## 2019-10-07 DIAGNOSIS — R29898 Other symptoms and signs involving the musculoskeletal system: Secondary | ICD-10-CM | POA: Diagnosis not present

## 2019-10-07 DIAGNOSIS — F0390 Unspecified dementia without behavioral disturbance: Secondary | ICD-10-CM | POA: Diagnosis present

## 2019-10-07 DIAGNOSIS — K5901 Slow transit constipation: Secondary | ICD-10-CM | POA: Diagnosis not present

## 2019-10-07 DIAGNOSIS — I6381 Other cerebral infarction due to occlusion or stenosis of small artery: Secondary | ICD-10-CM | POA: Diagnosis present

## 2019-10-07 DIAGNOSIS — M79671 Pain in right foot: Secondary | ICD-10-CM | POA: Diagnosis not present

## 2019-10-07 DIAGNOSIS — R29702 NIHSS score 2: Secondary | ICD-10-CM | POA: Diagnosis present

## 2019-10-07 DIAGNOSIS — Z20822 Contact with and (suspected) exposure to covid-19: Secondary | ICD-10-CM | POA: Diagnosis present

## 2019-10-07 DIAGNOSIS — E785 Hyperlipidemia, unspecified: Secondary | ICD-10-CM | POA: Diagnosis present

## 2019-10-07 MED ORDER — GADOBUTROL 1 MMOL/ML IV SOLN
6.0000 mL | Freq: Once | INTRAVENOUS | Status: AC | PRN
  Administered 2019-10-07: 6 mL via INTRAVENOUS

## 2019-10-07 NOTE — Progress Notes (Signed)
PROGRESS NOTE  Melissa Holt [id number]:[id number] DOB: [birth year] DOA: 10/05/2019 PCP: Patient, No Pcp Per  Brief History   Patient reports having 4-day history of worsening left lower extremity weakness with difficulty in ambulation.  She did have a fall about 2 weeks ago in the kitchen and fell backwards on her back but did not sustain any injury, loss of consciousness.  She did not seek medical attention at the time as she was able to ambulate normally following that fall until about 4 days ago.  Had another episode when her leg suddenly gave out on Tuesday morning.  No known history of lumbar spinal disease or cerebrovascular disease.  Work-up in the ER showed normal labs, normal CT of the head as well as MRI of the brain.  Patient was seen by teleneurology and was suspected to have lumbar spinal pathology and an MRI of the lumbar spine was recommended.  MRI is currently not available at this facility and patient is being transferred to [facility] when bed is available.    The patient was transferred to [facility] to a telemetry bed. CT head and MRI brain are negative for acute CVA. CT of the lumbar and thoracic spine demonstrated an age-indeterminate mild superior endplate deformity at K93 without retropulsion. There were mild degenerative changes of the lumbar spine without high grade canal or foraminal stenosis. MRI of the thoracic and lumbar spine demonstrated mild diffuse degenerative changes to the T spine. L-spine MRI has demonstrated L5-S1 facet degeneration and moderate subarticular stenosis. It has also demonstrated mild superior end-plate Fx O67 with marrow edema.   Echocardiogram: EF is 60-65%. LVH, LA is dilated, Mild MR.  Carotid Doppler: No hemodynamically significant stenosis. Right thyroid appears large and heterogenous.  Neurology has been consulted as has PT/OT.  Lipid panel has demonstrated LDL of 186. The patient has been started on Lipitor.  Consultants    Neurology  Procedures   None  Antibiotics   Anti-infectives (From admission, onward)   None     Subjective  The patient is resting quietly. No new complaints.  Objective   Vitals:  Vitals:   10/07/19 0954 10/07/19 1338  BP: 139/71 (!) 148/74  Pulse: 64 74  Resp:  18  Temp: 98 F (36.7 C) 97.9 F (36.6 C)  SpO2:  96%   Exam:  Constitutional:   The patient is awake, alert, and oriented x 3. No acute distress. Respiratory:   No increased work of breathing.  No wheezes, rales, or rhonchi  No tactile fremitus Cardiovascular:   Regular rate and rhythm  No murmurs, ectopy, or gallups.  No lateral PMI. No thrills. Abdomen:   Abdomen is soft, non-tender, non-distended  No hernias, masses, or organomegaly  Normoactive bowel sounds.  Musculoskeletal:   No cyanosis, clubbing, or edema Skin:   No rashes, lesions, ulcers  palpation of skin: no induration or nodules Neurologic:   CN 2-12 intact  Sensation all 4 extremities intact Psychiatric:   Mental status o Mood, affect appropriate o Orientation to person, place, time   judgment and insight appear intact  I have personally reviewed the following:   Today's Data   Vitals, lipid panel,   Imaging   CT Lumbar and Thoracic Spine  CT brain  MRI brain, L-spine, T-spine  Carotid Doppler  Cardiology Data   EKG  Echocardiogram  Scheduled Meds:  amLODipine  5 mg Oral Daily   aspirin EC  81 mg Oral Daily   atorvastatin  20 mg Oral Daily  enoxaparin (LOVENOX) injection  40 mg Subcutaneous Q24H   sodium chloride flush  3 mL Intravenous Q12H   Continuous Infusions:  sodium chloride     lactated ringers 75 mL/hr at 10/05/19 2216    Principal Problem:   Weakness of left lower extremity Active Problems:   Left leg weakness   LOS: 0 days   A & P  Left Lower extremity weakness: CT and MRI brain negative for acute pathology. Neurology has recommended a stroke work up. We  will do that. CT of lumbar and thoracic spine were negative. Will start on ASA. Stroke work up is negative. Await neurology opinion on weakness  Abnormal appearance of thyroid: Appears heterogenous and enlarged on the right. Will order ultrasound of the thyroid.  Hypertension: Not on any medications at home. As MRI is negative will start patient on norvasc.  Prolonged QTc: Avoid QT prolonging medications. Check chemistry. Recheck EKG.   Ambulatory dysfunction: PT/OT eval and treat.  Advanced age: noted.  I have seen and examined this patient myself. I have spent 32 minutes in her evaluation and care. Status is: Inpatient  Remains inpatient appropriate because:Ongoing diagnostic testing needed not appropriate for outpatient work up   Dispo: The patient is from: Home              Anticipated d/c is to: [facility]              Anticipated d/c date is: 2 days              Patient currently is not medically stable to d/c.   Arbor Cohen, DO [facility] Direct contact: see [email or web address]  7PM-7AM contact night coverage as above 10/07/2019, 2:09 PM  LOS: 0 days

## 2019-10-07 NOTE — Progress Notes (Signed)
Inpatient Rehab Admissions Coordinator Note:   Per PT recommendation, pt was screened for CIR candidacy by Gayland Curry, MS, CCC-SLP.  At this time we are recommending an Inpatient Rehab consult.  AC will contact Attending to request consult order.  Please contact me with questions.    Gayland Curry, [place], [place] Admissions Coordinator [phone] 10/07/19 10:38 AM

## 2019-10-07 NOTE — Evaluation (Addendum)
Physical Therapy Evaluation Patient Details Name: Melissa Holt MRN: [id number] DOB: 1940-10-30 Today's Date: 10/07/2019   History of Present Illness  Pt is a 79 y.o. F with significant PMH of colon cancer who presents with worsening left lower extremity weakness and difficulty with ambulation. MRI brain negative for acute abnormality. MRI thoracic spine showing mild superior endplate fracture of H60 with bone marrow edema consistent with acute fracture.  Clinical Impression  Prior to admission, pt lives alone and still is working in a factory setting. She presents with acute left hemiplegia, poor sitting/standing balance, decreased activity tolerance, and cognitive impairments. Pt requiring moderate assist for bed mobility and transfers. Performed face to face transfer with left knee block to rise to stand from edge of bed. With weight shifting, pt with left knee buckle; unable to take steps. Recommending CIR to address deficits and maximize functional mobility. Suspect excellent progress given PLOF and motivation.     Follow Up Recommendations CIR    Equipment Recommendations  3in1 (PT);Wheelchair (measurements PT);Wheelchair cushion (measurements PT)    Recommendations for Other Services OT consult;Rehab consult     Precautions / Restrictions Precautions Precautions: Fall Restrictions Weight Bearing Restrictions: No      Mobility  Bed Mobility Overal bed mobility: Needs Assistance Bed Mobility: Supine to Sit;Sit to Supine     Supine to sit: Mod assist Sit to supine: Mod assist   General bed mobility comments: ModA for trunk to upright, cues for sequencing and use of bed rail. ModA for LE assist back into bed. Dependent for scooting up in bed with bed in Trendelenberg position.  Transfers Overall transfer level: Needs assistance Equipment used: None Transfers: Sit to/from Stand Sit to Stand: Mod assist         General transfer comment: Face to face transfer with left  knee block, heavy modA to rise from edge of bed. L knee buckle with attempts at weight shifting  Ambulation/Gait             General Gait Details: unable  Stairs            Wheelchair Mobility    Modified Rankin (Stroke Patients Only) Modified Rankin (Stroke Patients Only) Pre-Morbid Rankin Score: No significant disability Modified Rankin: Severe disability     Balance Overall balance assessment: Needs assistance Sitting-balance support: Feet supported;Bilateral upper extremity supported Sitting balance-Leahy Scale: Poor Sitting balance - Comments: Reliant on BUE support, at least 4 episodes of overt LOB posteriorly and to right, requiring assist to correct.   Standing balance support: Bilateral upper extremity supported Standing balance-Leahy Scale: Poor Standing balance comment: reliant on external support                             Pertinent Vitals/Pain Pain Assessment: No/denies pain    Home Living Family/patient expects to be discharged to:: Private residence Living Arrangements: Alone Available Help at Discharge: Family;Available PRN/intermittently (daughter lives next door, granddaughter) Type of Home: Mobile home Home Access: Stairs to enter Entrance Stairs-Rails: Psychiatric nurse of Steps: 4 Home Layout: One level Home Equipment: Environmental consultant - 2 wheels      Prior Function Level of Independence: Needs assistance   Gait / Transfers Assistance Needed: Started using walker ~1 week ago  ADL's / Homemaking Assistance Needed: Typically independent prior to ~1 week ago when weakness occurring, requiring increased assist with ADL's  Comments: Works in a [employer]. Has had several "assisted" falls in the past 2-3  weeks, where her leg gives out and she has to control lower to floor     Hand Dominance   Dominant Hand: Right    Extremity/Trunk Assessment   Upper Extremity Assessment Upper Extremity Assessment: LUE  deficits/detail LUE Deficits / Details: Shoulder flexion AROM to ~80 degrees    Lower Extremity Assessment Lower Extremity Assessment: RLE deficits/detail;LLE deficits/detail RLE Deficits / Details: Strength 5/5 LLE Deficits / Details: Hip flexion 1/5, knee extension 4/5, ankle dorsiflexion 1/5    Cervical / Trunk Assessment Cervical / Trunk Assessment: Normal  Communication   Communication: Expressive difficulties (difficult to understand; pt reports several teeth missing)  Cognition Arousal/Alertness: Awake/alert Behavior During Therapy: WFL for tasks assessed/performed Overall Cognitive Status: Impaired/Different from baseline Area of Impairment: Safety/judgement;Awareness;Problem solving                         Safety/Judgement: Decreased awareness of safety;Decreased awareness of deficits Awareness: Emergent Problem Solving: Requires verbal cues General Comments: Pt with some emerging awareness of deficits i.e. left sided weakness but does not seem to be aware of functional impact or their etiology. Pt stating she may have developed weakness from "being in a cold restaurant." Also with several overt episodes of loss of balance on edge of bed without balance reaction or attempt to correct.      General Comments      Exercises     Assessment/Plan    PT Assessment Patient needs continued PT services  PT Problem List Decreased strength;Decreased range of motion;Decreased activity tolerance;Decreased balance;Decreased mobility;Decreased cognition;Decreased safety awareness       PT Treatment Interventions DME instruction;Gait training;Functional mobility training;Therapeutic activities;Therapeutic exercise;Balance training;Patient/family education;Wheelchair mobility training    PT Goals (Current goals can be found in the Care Plan section)  Acute Rehab PT Goals Patient Stated Goal: improve strength PT Goal Formulation: With patient Time For Goal Achievement:  10/21/19 Potential to Achieve Goals: Good    Frequency Min 3X/week   Barriers to discharge        Co-evaluation               AM-PAC PT "6 Clicks" Mobility  Outcome Measure Help needed turning from your back to your side while in a flat bed without using bedrails?: A Little Help needed moving from lying on your back to sitting on the side of a flat bed without using bedrails?: A Lot Help needed moving to and from a bed to a chair (including a wheelchair)?: A Lot Help needed standing up from a chair using your arms (e.g., wheelchair or bedside chair)?: A Lot Help needed to walk in hospital room?: Total Help needed climbing 3-5 steps with a railing? : Total 6 Click Score: 11    End of Session Equipment Utilized During Treatment: Gait belt Activity Tolerance: Patient limited by fatigue Patient left: in bed;with bed alarm set;with call bell/phone within reach Nurse Communication: Mobility status PT Visit Diagnosis: Unsteadiness on feet (R26.81);Other abnormalities of gait and mobility (R26.89);History of falling (Z91.81);Hemiplegia and hemiparesis Hemiplegia - Right/Left: Left Hemiplegia - dominant/non-dominant: Non-dominant Hemiplegia - caused by: Unspecified    Time: 8119-1478 PT Time Calculation (min) (ACUTE ONLY): 30 min   Charges:   PT Evaluation $PT Eval Moderate Complexity: 1 Mod PT Treatments $Therapeutic Activity: 8-22 mins          Wyona Almas, PT, DPT Acute Rehabilitation Services Pager [phone] Office [phone]   Deno Etienne 10/07/2019, 10:35 AM

## 2019-10-07 NOTE — Progress Notes (Addendum)
Carotid duplex bilateral study completed  Preliminary results relayed to Swayze, DO.   See CV Proc for preliminary results report.   Darlin Coco, RDMS

## 2019-10-08 ENCOUNTER — Inpatient Hospital Stay (HOSPITAL_COMMUNITY): Payer: Medicare Other

## 2019-10-08 LAB — CBC WITH DIFFERENTIAL/PLATELET
Abs Immature Granulocytes: 0.01 10*3/uL (ref 0.00–0.07)
Basophils Absolute: 0 10*3/uL (ref 0.0–0.1)
Basophils Relative: 0 %
Eosinophils Absolute: 0.1 10*3/uL (ref 0.0–0.5)
Eosinophils Relative: 2 %
HCT: 40 % (ref 36.0–46.0)
Hemoglobin: 13.1 g/dL (ref 12.0–15.0)
Immature Granulocytes: 0 %
Lymphocytes Relative: 35 %
Lymphs Abs: 2.5 10*3/uL (ref 0.7–4.0)
MCH: 31.5 pg (ref 26.0–34.0)
MCHC: 32.8 g/dL (ref 30.0–36.0)
MCV: 96.2 fL (ref 80.0–100.0)
Monocytes Absolute: 0.8 10*3/uL (ref 0.1–1.0)
Monocytes Relative: 12 %
Neutro Abs: 3.5 10*3/uL (ref 1.7–7.7)
Neutrophils Relative %: 51 %
Platelets: 182 10*3/uL (ref 150–400)
RBC: 4.16 MIL/uL (ref 3.87–5.11)
RDW: 12.6 % (ref 11.5–15.5)
WBC: 6.9 10*3/uL (ref 4.0–10.5)
nRBC: 0 % (ref 0.0–0.2)

## 2019-10-08 LAB — BASIC METABOLIC PANEL
Anion gap: 11 (ref 5–15)
BUN: 8 mg/dL (ref 8–23)
CO2: 22 mmol/L (ref 22–32)
Calcium: 9.4 mg/dL (ref 8.9–10.3)
Chloride: 105 mmol/L (ref 98–111)
Creatinine, Ser: 0.7 mg/dL (ref 0.44–1.00)
GFR calc Af Amer: >60 mL/min (ref >60)
GFR calc non Af Amer: >60 mL/min (ref >60)
Glucose, Bld: 101 mg/dL — ABNORMAL HIGH (ref 70–99)
Potassium: 3.4 mmol/L — ABNORMAL LOW (ref 3.5–5.1)
Sodium: 138 mmol/L (ref 135–145)

## 2019-10-08 MED ORDER — LACTULOSE 10 GM/15ML PO SOLN
20.0000 g | Freq: Three times a day (TID) | ORAL | Status: DC
  Administered 2019-10-08 – 2019-10-10 (×4): 20 g via ORAL
  Filled 2019-10-08 (×9): qty 30

## 2019-10-08 NOTE — Progress Notes (Signed)
Inpatient Rehabilitation Admissions Coordinator  Inpatient rehab consult received. Noted additional imaging ordered. I will follow up tomorrow to complete rehab consult after imaging.  Danne Baxter, RN, MSN Rehab Admissions Coordinator [phone] 10/08/2019 3:50 PM

## 2019-10-08 NOTE — Progress Notes (Signed)
PROGRESS NOTE  Melissa Holt QMV:[id number] DOB: 1940/06/01 DOA: 10/05/2019 PCP: Patient, No Pcp Per  Brief History   Patient reports having 4-day history of worsening left lower extremity weakness with difficulty in ambulation.  She did have a fall about 2 weeks ago in the kitchen and fell backwards on her back but did not sustain any injury, loss of consciousness.  She did not seek medical attention at the time as she was able to ambulate normally following that fall until about 4 days ago.  Had another episode when her leg suddenly gave out on Tuesday morning.  No known history of lumbar spinal disease or cerebrovascular disease.  Work-up in the ER showed normal labs, normal CT of the head as well as MRI of the brain.  Patient was seen by teleneurology and was suspected to have lumbar spinal pathology and an MRI of the lumbar spine was recommended.  MRI is currently not available at this facility and patient is being transferred to [facility] when bed is available.    The patient was transferred to [facility] to a telemetry bed. CT head and MRI brain are negative for acute CVA. CT of the lumbar and thoracic spine demonstrated an age-indeterminate mild superior endplate deformity at M84 without retropulsion. There were mild degenerative changes of the lumbar spine without high grade canal or foraminal stenosis. MRI of the thoracic and lumbar spine demonstrated mild diffuse degenerative changes to the T spine. L-spine MRI has demonstrated L5-S1 facet degeneration and moderate subarticular stenosis. It has also demonstrated mild superior end-plate Fx X32 with marrow edema.   Echocardiogram: EF is 60-65%. LVH, LA is dilated, Mild MR.  Carotid Doppler: No hemodynamically significant stenosis. Right thyroid appears large and heterogenous.  Neurology has been consulted as has PT/OT.  Lipid panel has demonstrated LDL of 186. The patient has been started on Lipitor.  Consultants   . Neurology  Procedures  . None  Antibiotics   Anti-infectives (From admission, onward)   None     Subjective  The patient is resting quietly. She is complaining of progression of her symptoms to her left arm and now is displaying mild facial droop.  Objective   Vitals:  Vitals:   10/08/19 0840 10/08/19 1213  BP: (!) 164/74 (!) 167/85  Pulse: 60 64  Resp: 16 15  Temp: 98 F (36.7 C) 98.2 F (36.8 C)  SpO2:  96%   Exam:  Constitutional:  . The patient is awake, alert, and oriented x 3. No acute distress. Respiratory:  . No increased work of breathing. . No wheezes, rales, or rhonchi . No tactile fremitus Cardiovascular:  . Regular rate and rhythm . No murmurs, ectopy, or gallups. . No lateral PMI. No thrills. Abdomen:  . Abdomen is soft, non-tender, non-distended . No hernias, masses, or organomegaly . Normoactive bowel sounds.  Musculoskeletal:  . No cyanosis, clubbing, or edema Skin:  . No rashes, lesions, ulcers . palpation of skin: no induration or nodules Neurologic:  . CN 2-12 intact . Sensation all 4 extremities intact Psychiatric:  . Mental status o Mood, affect appropriate o Orientation to person, place, time  . judgment and insight appear intact  I have personally reviewed the following:   Today's Data  . Vitals, BMP, CBC  Imaging  . CT Lumbar and Thoracic Spine . CT brain . MRI brain, L-spine, T-spine . Carotid Doppler  Cardiology Data  . EKG . Echocardiogram  Scheduled Meds: . amLODipine  5 mg Oral Daily  .  aspirin EC  81 mg Oral Daily  . atorvastatin  20 mg Oral Daily  . enoxaparin (LOVENOX) injection  40 mg Subcutaneous Q24H  . lactulose  20 g Oral TID  . sodium chloride flush  3 mL Intravenous Q12H   Continuous Infusions: . sodium chloride    . lactated ringers 75 mL/hr at 10/05/19 2216    Principal Problem:   Weakness of left lower extremity Active Problems:   Left leg weakness   LOS: 1 day   A & P  Left  Lower extremity weakness: CT and MRI brain negative for acute pathology. Neurology has recommended a stroke work up. We will do that. CT of lumbar and thoracic spine were negative. Will start on ASA. Stroke work up is negative. She has had progression of her symptoms to include weakness of her left upper extremity and mild facial droop. Neurology gives a differential diagnosis to include a small MRI negative stroke vs stuttering lacunar stroke vs transverse myelitis. He has ordered repeat non-contrast MRI brain, non-contrast MRI C-spine, and MRA brain.   Abnormal appearance of thyroid: Appears heterogenous and enlarged on the right. Will order ultrasound of the thyroid.  Hypertension: Not on any medications at home. As MRI is negative will start patient on norvasc.  Prolonged QTc: Avoid QT prolonging medications. Check chemistry. Recheck EKG.   Ambulatory dysfunction: PT/OT eval and treat. Recommendation is for CIR.  Advanced age: noted.  I have seen and examined this patient myself. I have spent 36 minutes in her evaluation and care.  Status is: Inpatient  Remains inpatient appropriate because:Ongoing diagnostic testing needed not appropriate for outpatient work up   Dispo: The patient is from: Home              Anticipated d/c is to: CIR              Anticipated d/c date is: 2 days              Patient currently is not medically stable to d/c.   [name], DO [facility] Direct contact: see [email or web address]  7PM-7AM contact night coverage as above 10/08/2019, 3:37 PM  LOS: 0 days

## 2019-10-08 NOTE — Progress Notes (Signed)
Physical Therapy Treatment Patient Details Name: Melissa Holt MRN: [id number] DOB: [birth year] Today's Date: 10/08/2019    History of Present Illness Pt is a 79 y.o. F with significant PMH of colon cancer who presents with worsening left lower extremity weakness and difficulty with ambulation. MRI brain negative for acute abnormality. MRI thoracic spine showing mild superior endplate fracture of U20 with bone marrow edema consistent with acute fracture.    PT Comments    Pt progressing towards physical therapy goals. Was able to perform transfers and pre-gait activity with up to +2 mod assist for balance support and safety. Stedy utilized for pre-gait activity for utilization of the center bar for support and front plate for knee block on the L. Pt with good rehab effort and able to tolerate 4 stands throughout session with longest stand lasting 2 minutes. Pt remains an excellent rehab candidate as she is motivated to participate and would significantly benefit from the multidisciplinary therapies available at CIR. Will continue to follow.    Follow Up Recommendations  CIR     Equipment Recommendations  3in1 (PT);Wheelchair (measurements PT);Wheelchair cushion (measurements PT)    Recommendations for Other Services Rehab consult     Precautions / Restrictions Precautions Precautions: Fall Restrictions Weight Bearing Restrictions: No    Mobility  Bed Mobility Overal bed mobility: Needs Assistance Bed Mobility: Supine to Sit;Sit to Supine     Supine to sit: Mod assist Sit to supine: Mod assist;+2 for physical assistance   General bed mobility comments: Mod assist for trunk elevation and controlled descent back to supine. +2 utilized for LE's up into bed as well at end of session. Required +2 assist for any significant readjusting/scooting in bed.   Transfers Overall transfer level: Needs assistance   Transfers: Sit to/from Stand Sit to Stand: Mod assist;+2 physical  assistance;+2 safety/equipment;From elevated surface         General transfer comment: Stedy utilized for pre-gait activity and pt was able to power-up to full stand with +2 assist for balance support and safety. Pt required frequent cues to improved posture, engage glutes and core to maintain standing position. Stood x4 throughout session.   Ambulation/Gait Ambulation/Gait assistance: Min assist;+2 physical assistance           General Gait Details: Pt as able to perform pre-gait activity while in [place] including heel raises, weight shifts, toe raises (R while standing, L while sitting)   Stairs             Wheelchair Mobility    Modified Rankin (Stroke Patients Only) Modified Rankin (Stroke Patients Only) Pre-Morbid Rankin Score: No significant disability Modified Rankin: Severe disability     Balance Overall balance assessment: Needs assistance Sitting-balance support: Feet supported;Bilateral upper extremity supported Sitting balance-Leahy Scale: Poor Sitting balance - Comments: Reliant on BUE support, at least 4 episodes of overt LOB posteriorly and to right, requiring assist to correct.   Standing balance support: Bilateral upper extremity supported Standing balance-Leahy Scale: Poor Standing balance comment: reliant on external support                            Cognition Arousal/Alertness: Awake/alert Behavior During Therapy: WFL for tasks assessed/performed Overall Cognitive Status: Impaired/Different from baseline Area of Impairment: Safety/judgement;Awareness;Problem solving                         Safety/Judgement: Decreased awareness of safety;Decreased awareness of deficits Awareness: Emergent  Problem Solving: Requires verbal cues;Slow processing;Difficulty sequencing        Exercises      General Comments        Pertinent Vitals/Pain Pain Assessment: No/denies pain    Home Living                       Prior Function            PT Goals (current goals can now be found in the care plan section) Acute Rehab PT Goals Patient Stated Goal: improve strength PT Goal Formulation: With patient Time For Goal Achievement: 10/21/19 Potential to Achieve Goals: Good Progress towards PT goals: Progressing toward goals    Frequency    Min 3X/week      PT Plan Current plan remains appropriate    Co-evaluation              AM-PAC PT "6 Clicks" Mobility   Outcome Measure  Help needed turning from your back to your side while in a flat bed without using bedrails?: A Little Help needed moving from lying on your back to sitting on the side of a flat bed without using bedrails?: A Lot Help needed moving to and from a bed to a chair (including a wheelchair)?: A Lot Help needed standing up from a chair using your arms (e.g., wheelchair or bedside chair)?: A Lot Help needed to walk in hospital room?: Total Help needed climbing 3-5 steps with a railing? : Total 6 Click Score: 11    End of Session Equipment Utilized During Treatment: Gait belt Activity Tolerance: Patient limited by fatigue Patient left: in bed;with bed alarm set;with call bell/phone within reach Nurse Communication: Mobility status PT Visit Diagnosis: Unsteadiness on feet (R26.81);Other abnormalities of gait and mobility (R26.89);History of falling (Z91.81);Hemiplegia and hemiparesis Hemiplegia - Right/Left: Left Hemiplegia - dominant/non-dominant: Non-dominant Hemiplegia - caused by: Unspecified     Time: 7209-4709 PT Time Calculation (min) (ACUTE ONLY): 29 min  Charges:  $Gait Training: 23-37 mins                     Melissa Holt, PT, DPT Acute Rehabilitation Services Pager: [phone] Office: [phone]    Melissa Holt 10/08/2019, 12:20 PM

## 2019-10-08 NOTE — Progress Notes (Addendum)
NEUROLOGY CONSULTATION PROGRESS NOTE   Date of service: October 08, 2019 Patient Name: Melissa Holt MRN:  [id number] DOB:  [birth year]  Interval hx   Endorses weakness in her L arm today and feels like her symptoms are progressing from her leg to her arm She is unable to lift her left Leg off the bed. Has some dysarthric speech that she attributes to not having teeth. Feels her speech is not any worse than it typically is. She also has ?mild facial asymmetry today that was not noted on prior exams.  She denies any saddle anesthesia, no urinary or bowel incontinence, no lhermitte's sign.  Past History   Past Medical History:  Diagnosis Date  . Cancer [facility])    Past Surgical History:  Procedure Laterality Date  . COLON SURGERY     No family history on file. Social History   Socioeconomic History  . Marital status: Widowed    Spouse name: Not on file  . Number of children: Not on file  . Years of education: Not on file  . Highest education level: Not on file  Occupational History  . Not on file  Tobacco Use  . Smoking status: Never Smoker  . Smokeless tobacco: Current User    Types: Snuff  Substance and Sexual Activity  . Alcohol use: Yes  . Drug use: Never  . Sexual activity: Not on file  Other Topics Concern  . Not on file  Social History Narrative  . Not on file   Social Determinants of Health   Financial Resource Strain:   . Difficulty of Paying Living Expenses: Not on file  Food Insecurity:   . Worried About Charity fundraiser in the Last Year: Not on file  . Ran Out of Food in the Last Year: Not on file  Transportation Needs:   . Lack of Transportation (Medical): Not on file  . Lack of Transportation (Non-Medical): Not on file  Physical Activity:   . Days of Exercise per Week: Not on file  . Minutes of Exercise per Session: Not on file  Stress:   . Feeling of Stress : Not on file  Social Connections:   . Frequency of Communication with Friends and Family:  Not on file  . Frequency of Social Gatherings with Friends and Family: Not on file  . Attends Religious Services: Not on file  . Active Member of Clubs or Organizations: Not on file  . Attends Archivist Meetings: Not on file  . Marital Status: Not on file   No Known Allergies  Medications   No medications prior to admission.     Vitals  Temp:  [97.9 F (36.6 C)-98.3 F (36.8 C)] 98.2 F (36.8 C) (08/30 0438) Pulse Rate:  [64-74] 64 (08/30 0438) Resp:  [14-18] 14 (08/30 0438) BP: (139-156)/(69-82) 147/74 (08/30 0438) SpO2:  [95 %-98 %] 97 % (08/30 0438) Weight:  [67.6 kg] 67.6 kg (08/30 0438)  Body mass index is 30.1 kg/m.  Physical Exam   General: Laying comfortably in bed; in no acute distress.  HENT: Normal oropharynx, poor dentition. Normal external appearance of ears and nose. Neck: Supple, no pain or tenderness CV: No JVD. No peripheral edema. Pulmonary: Symmetric Chest rise. Normal respiratory effort. Abdomen: Soft, non-tender Ext: No cyanosis, edema, or deformity Skin: No visible rash. Normal palpation of skin. Musculoskeletal: Normal digits and nails by inspection. No clubbing.  Neurologic Examination  Mental status/Cognition: Alert, oriented to self, place, month and year, good attention.  Speech/language: Dysarthric speech, fluent, comprehension intact, object naming intact, repetition intact. Cranial nerves:   CN II Pupils equal and reactive to light, no VF deficits   CN III,IV,VI EOM intact, no gaze preference or deviation, no nystagmus   CN V normal sensation in V1, V2, and V3 segments bilaterally   CN VII ?mild asymmetry with some either some nasolabial fold flattening on the right. Difficult to state if these are just folds due to old age.   CN VIII normal hearing to speech   CN IX & X normal palatal elevation, no uvular deviation   CN XI 5/5 head turn and 5/5 shoulder shrug bilaterally   CN XII midline tongue protrusion   Motor:  Muscle  bulk:normal, tone normal, pronator drift: yes, left sided Mvmt Root Nerve  Muscle Right Left Comments  SA C5/6 Ax Deltoid 5 4+   EF C5/6 Mc Biceps 5 5   EE C6/7/8 Rad Triceps 5 5   WF C6/7 Med FCR 5 3   WE C7/8 PIN ECU 5 3   F Ab C8/T1 U ADM/FDI 5 4+   HF L1/2/3 Fem Illopsoas 5 1   KE L2/3/4 Fem Quad 5 2   DF L4/5 D Peron Tib Ant 5 2   PF S1/2 Tibial Grc/Sol 5 2    Reflexes:  Right Left Comments  Pectoralis      Biceps (C5/6) 1 2   Brachioradialis (C5/6) 1 2    Triceps (C6/7) 1 2    Patellar (L3/4) 2+ 2+    Achilles (S1) 0 0    Hoffman      Plantar withdraws withdraws   Jaw jerk    Sensation:  Light touch Intact throughout   Pin prick Intact throughout   Temperature    Vibration Intact throughout  Proprioception    Coordination/Complex Motor:  - Finger to Nose intact on R, slowed on left - Heel to shin unable to do due to weakness - Rapid alternating movement slowed on left - Gait: deferred due to the known T spine acute fx.  Labs   Lab Results  Component Value Date   NA 138 10/08/2019   K 3.4 (L) 10/08/2019   CL 105 10/08/2019   CO2 22 10/08/2019   GLUCOSE 101 (H) 10/08/2019   BUN 8 10/08/2019   CREATININE 0.70 10/08/2019   CALCIUM 9.4 10/08/2019   ALBUMIN 4.1 10/05/2019   AST 21 10/05/2019   ALT 16 10/05/2019   ALKPHOS 67 10/05/2019   BILITOT 0.5 10/05/2019   GFRNONAA >60 10/08/2019   GFRAA >60 10/08/2019     Imaging and Diagnostic studies  MRI Brain without contrast: 10/05/19: No acute abnormality  MR T, L spine w/o contrast 10/06/19: Mild superior endplate fracture Z02 with bone marrow edema consistent with a recent fracture. No associated spinal stenosis. No other fracture. Mild superior endplate fracture of H85 with bone marrow edema consistent with acute fracture. Negative for spinal stenosis. No other fracture 3 mm anterolisthesis L5-S1 with severe facet degeneration and moderate subarticular stenosis bilaterally. Mild  subarticular stenosis bilaterally at L4-5.  MRI Pelvis: IMPRESSION: 1. Normal lumbosacral plexus. 2. Incompletely visualized acute T12 superior endplate compression fracture, better evaluated on spine MRI from yesterday. 3. Mild bilateral hip osteoarthritis. Small right paralabral cyst consistent with underlying labral tear. 4. Bilateral hamstring tendinosis.  Impression   Melissa Holt is a 79 y.o. female with PMH significant for remote colon cancer p/w sudden onset "legs giving out", then Left leg weakness on  exam. Endorses progression of sypmtoms today to involve L arm, does not know LKW but is worse than presentation she reports. Neuro exam with L arm weakness and worsening of left leg weakness compared to prio neuro exam with ? Right facial droop, pronator drift in LUE.  Given worsening deficit, would recommend repeat MRI Brain without contrast. I also added MRI C spine given LUE and LLE symptoms could potentially localize to the cervical spine.  The sudden onset of her symptoms does make me think that this could be a potential CVA, however not common for CVA to have slow progression of symptoms, the exception to that is stuttering lacune. Also considering transverse myelitis but does not tyipcally present this rapid and usually has a spinal level but can have slow progressive worsening of symptoms. Differential: small MRI negative stroke vs stuttering lacune vs transverse myelitis.  Recommendations  - I ordered MRI Brain without contrast. - I ordered MRA head without contrast - I ordered MRI C spine without contrast. ______________________________________________________________________   Thank you for the opportunity to take part in the care of this patient. If you have any further questions, please contact the neurology consultation attending.  Signed,  [place] Pager Number [phone]

## 2019-10-08 NOTE — Evaluation (Signed)
Occupational Therapy Evaluation Patient Details Name: Melissa Holt MRN: [id number] DOB: 07-27-1940 Today's Date: 10/08/2019    History of Present Illness Pt is a 79 y.o. F with significant PMH of colon cancer who presents with worsening left lower extremity weakness and difficulty with ambulation. MRI brain negative for acute abnormality. MRI thoracic spine showing mild superior endplate fracture of Z66 with bone marrow edema consistent with acute fracture. Pt reporting evolving weakness in L UE 10/08/19.   Clinical Impression   Pt was functioning independently until the week prior to admission when she began relying on a RW due to LE weakness. 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 Pt presents with L side weakness and dysarthria. She demonstrates decreased awareness of deficits and safety and poor sitting and standing balance. Pt requires min to total assist for ADL. Pt transferred with use of stedy. Pt will need intensive rehab. Recommending CIR. Will follow acutely.    Follow Up Recommendations  CIR    Equipment Recommendations  Other (comment) (defer to next venue)    Recommendations for Other Services       Precautions / Restrictions Precautions Precautions: Fall Restrictions Weight Bearing Restrictions: No      Mobility Bed Mobility Overal bed mobility: Needs Assistance Bed Mobility: Rolling;Supine to Sit;Sit to Supine Rolling: Min assist   Supine to sit: Mod assist Sit to supine: Mod assist   General bed mobility comments: min assist to complete and maintain roll to side, mod assist to raise trunk and for LEs back into bed  Transfers Overall transfer level: Needs assistance   Transfers: Sit to/from Stand Sit to Stand: Mod assist;+2 safety/equipment         General transfer comment: assist to rise using stedy    Balance Overall balance assessment: Needs assistance Sitting-balance support: Feet supported;Bilateral upper extremity  supported Sitting balance-Leahy Scale: Poor Sitting balance - Comments: reliant on B UE and external support in static sitting   Standing balance support: Bilateral upper extremity supported Standing balance-Leahy Scale: Poor Standing balance comment: reliant on external support                           ADL either performed or assessed with clinical judgement   ADL Overall ADL's : Needs assistance/impaired Eating/Feeding: Minimal assistance;Sitting   Grooming: Minimal assistance;Wash/dry hands;Wash/dry face;Sitting   Upper Body Bathing: Moderate assistance;Sitting   Lower Body Bathing: Maximal assistance;Sit to/from stand   Upper Body Dressing : Moderate assistance;Sitting   Lower Body Dressing: Maximal assistance;Sit to/from stand   Toilet Transfer: Moderate assistance;Stand-pivot;BSC   Toileting- Clothing Manipulation and Hygiene: Total assistance;Sit to/from stand Toileting - Clothing Manipulation Details (indicate cue type and reason): pt with loose stools             Vision Baseline Vision/History: Wears glasses Wears Glasses: Reading only Patient Visual Report: No change from baseline       Perception     Praxis      Pertinent Vitals/Pain Pain Assessment: No/denies pain     Hand Dominance Right   Extremity/Trunk Assessment Upper Extremity Assessment Upper Extremity Assessment: LUE deficits/detail LUE Deficits / Details: 3-/5 shoulder, 4-/5 elbow, 3+/5 forearm and gross grasp, 3/5 wrist LUE Coordination: decreased fine motor;decreased gross motor   Lower Extremity Assessment Lower Extremity Assessment: Defer to PT evaluation   Cervical / Trunk Assessment Cervical / Trunk Assessment: Normal   Communication Communication Communication: Expressive difficulties (dysarthria, although pt  attributes to not having teeth)   Cognition Arousal/Alertness: Awake/alert Behavior During Therapy: WFL for tasks assessed/performed Overall Cognitive  Status: Impaired/Different from baseline Area of Impairment: Safety/judgement;Awareness;Problem solving                         Safety/Judgement: Decreased awareness of safety;Decreased awareness of deficits Awareness: Emergent Problem Solving: Requires verbal cues;Slow processing;Difficulty sequencing General Comments: pt denies changes to her speech, aware of weakness in L UE increasing   General Comments       Exercises     Shoulder Instructions      Home Living Family/patient expects to be discharged to:: Private residence Living Arrangements: Alone Available Help at Discharge: Family;Available PRN/intermittently Type of Home: Mobile home Home Access: Stairs to enter Entrance Stairs-Number of Steps: 4 Entrance Stairs-Rails: Right;Left Home Layout: One level     Bathroom Shower/Tub: Teacher, early years/pre: Standard     Home Equipment: Environmental consultant - 2 wheels          Prior Functioning/Environment Level of Independence: Needs assistance  Gait / Transfers Assistance Needed: Started using walker ~1 week ago ADL's / Homemaking Assistance Needed: typically independent until 1 week PTA, 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   Comments: weakness in LEs with buckling over last several weeks        OT Problem List: Decreased strength;Decreased activity tolerance;Impaired balance (sitting and/or standing);Decreased coordination;Decreased cognition;Decreased safety awareness;Decreased knowledge of use of DME or AE;Impaired UE functional use      OT Treatment/Interventions: Self-care/ADL training;DME and/or AE instruction;Balance training;Patient/family education;Cognitive remediation/compensation;Therapeutic activities;Neuromuscular education    OT Goals(Current goals can be found in the care plan section) Acute Rehab OT Goals Patient Stated Goal: improve strength OT Goal Formulation: With patient Time For Goal Achievement: 10/22/19 Potential to  Achieve Goals: Good ADL Goals Pt Will Perform Grooming: with set-up;sitting Pt Will Perform Upper Body Bathing: with min assist;sitting Pt Will Perform Lower Body Bathing: with mod assist;sit to/from stand Pt Will Transfer to Toilet: with min assist;stand pivot transfer;bedside commode Pt Will Perform Toileting - Clothing Manipulation and hygiene: with min assist;sitting/lateral leans Pt/caregiver will Perform Home Exercise Program: Left upper extremity;Increased strength;With minimal assist Additional ADL Goal #1: Pt will demonstrate fair sitting balance as a precursor to ADL. Additional ADL Goal #2: Pt will perform bed mobility with min assist in preparation for ADL.  OT Frequency: Min 3X/week   Barriers to D/C:            Co-evaluation              AM-PAC OT "6 Clicks" Daily Activity     Outcome Measure Help from another person eating meals?: A Little Help from another person taking care of personal grooming?: A Little Help from another person toileting, which includes using toliet, bedpan, or urinal?: A Lot Help from another person bathing (including washing, rinsing, drying)?: Total Help from another person to put on and taking off regular upper body clothing?: A Lot Help from another person to put on and taking off regular lower body clothing?: Total 6 Click Score: 12   End of Session Equipment Utilized During Treatment: Gait belt  Activity Tolerance: Patient tolerated treatment well Patient left: in bed;with call bell/phone within reach;with bed alarm set  OT Visit Diagnosis: Unsteadiness on feet (R26.81);Other abnormalities of gait and mobility (R26.89);Hemiplegia and hemiparesis;Muscle weakness (generalized) (M62.81);History of falling (Z91.81);Other symptoms and signs involving cognitive function Hemiplegia - Right/Left: Left  Hemiplegia - dominant/non-dominant: Non-Dominant                Time: 0034-9611 OT Time Calculation (min): 32 min Charges:  OT General  Charges $OT Visit: 1 Visit OT Evaluation $OT Eval Moderate Complexity: 1 Mod OT Treatments $Self Care/Home Management : 8-22 mins  Nestor Lewandowsky, OTR/L Acute Rehabilitation Services Pager: [phone] Office: [phone]  Malka So 10/08/2019, 3:35 PM

## 2019-10-09 ENCOUNTER — Encounter (HOSPITAL_COMMUNITY): Payer: Self-pay | Admitting: Internal Medicine

## 2019-10-09 DIAGNOSIS — R29898 Other symptoms and signs involving the musculoskeletal system: Secondary | ICD-10-CM

## 2019-10-09 LAB — HEMOGLOBIN A1C
Hgb A1c MFr Bld: 5.8 % — ABNORMAL HIGH (ref 4.8–5.6)
Mean Plasma Glucose: 119.76 mg/dL

## 2019-10-09 MED ORDER — ATORVASTATIN CALCIUM 80 MG PO TABS
80.0000 mg | ORAL_TABLET | Freq: Every day | ORAL | Status: DC
  Administered 2019-10-09 – 2019-10-12 (×4): 80 mg via ORAL
  Filled 2019-10-09 (×4): qty 1

## 2019-10-09 MED ORDER — POTASSIUM CHLORIDE CRYS ER 20 MEQ PO TBCR
40.0000 meq | EXTENDED_RELEASE_TABLET | Freq: Once | ORAL | Status: AC
  Administered 2019-10-09: 40 meq via ORAL
  Filled 2019-10-09: qty 2

## 2019-10-09 MED ORDER — CLOPIDOGREL BISULFATE 75 MG PO TABS
75.0000 mg | ORAL_TABLET | Freq: Every day | ORAL | Status: DC
  Administered 2019-10-09 – 2019-10-12 (×4): 75 mg via ORAL
  Filled 2019-10-09 (×4): qty 1

## 2019-10-09 MED ORDER — STROKE: EARLY STAGES OF RECOVERY BOOK
Status: AC
  Filled 2019-10-09: qty 1

## 2019-10-09 NOTE — Progress Notes (Signed)
Physical Therapy Treatment Patient Details Name: Melissa Holt MRN: [id number] DOB: 26-Mar-1940 Today's Date: 10/09/2019    History of Present Illness Pt is a 79 y.o. F with significant PMH of colon cancer who presents with worsening left lower extremity weakness and difficulty with ambulation. 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 MRI thoracic spine showing mild superior endplate fracture of J57 with bone marrow edema consistent with acute fracture. Pt reporting evolving weakness in L UE 10/08/19.    PT Comments    Pt progressing well with mobility, tolerating pre-gait tasks of unilateral stepping and lateral weight shifting well. Pt fatigues quickly, but with seated rest recovery is able to perform multiple bouts. PT initiated short-distance gait training with use of RW this day, pt demonstrating difficulty with swing phase progression of LLE and L knee recurvatum in stance phase. Pt requiring mod assist overall for mobility. PT continuing to recommend CIR post-acutely.    Follow Up Recommendations  CIR     Equipment Recommendations  3in1 (PT);Wheelchair (measurements PT);Wheelchair cushion (measurements PT)    Recommendations for Other Services Rehab consult     Precautions / Restrictions Precautions Precautions: Fall;Back Precaution Comments: acute spinal fracture - log roll in/out of bed Restrictions Weight Bearing Restrictions: No    Mobility  Bed Mobility Overal bed mobility: Needs Assistance Bed Mobility: Rolling;Sidelying to Sit Rolling: Min assist Sidelying to sit: Mod assist;HOB elevated       General bed mobility comments: min assist for roll to L for trunk translation, PT cuing pt for LUE hand placement on bedrail to assist in roll. Mod assist for sidelying to sit for trunk elevation, scooting to EOB with use of bed pad, postural support to prevent posterior  leaning.  Transfers Overall transfer level: Needs assistance Equipment used: None;Rolling walker (2 wheeled) Transfers: Sit to/from Stand Sit to Stand: Mod assist         General transfer comment: Mod assist for power up, trunk extension to upright, and steadying. STS x3, from EOB x2 and from chair x1. Stand from chair pt requested to use RW, requires cuing for hand placement.  Ambulation/Gait Ambulation/Gait assistance: Mod assist Gait Distance (Feet): 12 Feet Assistive device: Rolling walker (2 wheeled) Gait Pattern/deviations: Step-through pattern;Decreased stride length;Trunk flexed;Narrow base of support Gait velocity: decr   General Gait Details: Mod assist to steady, guide RW and pt, and intermittent L knee guarding/blocking. + for L knee recurvatum in stance, requires cuing for widening BOS, increasing hip and knee flexion on L for foot clearance.   Stairs             Wheelchair Mobility    Modified Rankin (Stroke Patients Only) Modified Rankin (Stroke Patients Only) Pre-Morbid Rankin Score: No significant disability Modified Rankin: Moderately severe disability     Balance Overall balance assessment: Needs assistance Sitting-balance support: Feet supported;Bilateral upper extremity supported Sitting balance-Leahy Scale: Fair Sitting balance - Comments: posterior leaning Postural control: Posterior lean Standing balance support: Bilateral upper extremity supported Standing balance-Leahy Scale: Poor Standing balance comment: reliant on external support                            Cognition Arousal/Alertness: Awake/alert Behavior During Therapy: WFL for tasks assessed/performed Overall Cognitive Status: Impaired/Different from baseline Area of Impairment: Safety/judgement;Problem solving  Safety/Judgement: Decreased awareness of safety;Decreased awareness of deficits   Problem Solving: Requires verbal  cues;Slow processing;Difficulty sequencing General Comments: pt states "I had a heart attack" when PT asked why she is here in hospital, pt able to correct self and state she had a stroke when given options to select from. Requires multimodal cuing during mobility, aware of deficits today stating "my legs are weak"      Exercises Other Exercises Other Exercises: Pre-gait tasks: UL stepping x5 bilaterally, weight shifting L and R Other Exercises: Resisted hip and knee extension in supine, x10, LLE    General Comments        Pertinent Vitals/Pain Pain Assessment: Faces Faces Pain Scale: No hurt Pain Intervention(s): Repositioned;Limited activity within patient's tolerance;Monitored during session    Home Living                      Prior Function            PT Goals (current goals can now be found in the care plan section) Acute Rehab PT Goals Patient Stated Goal: improve strength PT Goal Formulation: With patient Time For Goal Achievement: 10/21/19 Potential to Achieve Goals: Good Progress towards PT goals: Progressing toward goals    Frequency    Min 4X/week      PT Plan Current plan remains appropriate    Co-evaluation              AM-PAC PT "6 Clicks" Mobility   Outcome Measure  Help needed turning from your back to your side while in a flat bed without using bedrails?: A Little Help needed moving from lying on your back to sitting on the side of a flat bed without using bedrails?: A Lot Help needed moving to and from a bed to a chair (including a wheelchair)?: A Lot Help needed standing up from a chair using your arms (e.g., wheelchair or bedside chair)?: A Lot Help needed to walk in hospital room?: A Lot Help needed climbing 3-5 steps with a railing? : Total 6 Click Score: 12    End of Session Equipment Utilized During Treatment: Gait belt Activity Tolerance: Patient limited by fatigue Patient left: with call bell/phone within reach;in  chair;with chair alarm set Nurse Communication: Mobility status PT Visit Diagnosis: Unsteadiness on feet (R26.81);Other abnormalities of gait and mobility (R26.89);History of falling (Z91.81);Hemiplegia and hemiparesis Hemiplegia - Right/Left: Left Hemiplegia - dominant/non-dominant: Non-dominant Hemiplegia - caused by: Cerebral infarction     Time: 6384-5364 PT Time Calculation (min) (ACUTE ONLY): 28 min  Charges:  $Gait Training: 23-37 mins                     Melissa Holt E, PT Acute Rehabilitation Services Pager [phone]  Office [phone]    Melissa Holt D Elonda Husky 10/09/2019, 5:32 PM

## 2019-10-09 NOTE — Progress Notes (Signed)
PROGRESS NOTE  Melissa Holt IOX:[id number] DOB: [birth year] DOA: 10/05/2019 PCP: Patient, No Pcp Per  Brief History   Patient reports having 4-day history of worsening left lower extremity weakness with difficulty in ambulation.  She did have a fall about 2 weeks ago in the kitchen and fell backwards on her back but did not sustain any injury, loss of consciousness.  She did not seek medical attention at the time as she was able to ambulate normally following that fall until about 4 days ago.  Had another episode when her leg suddenly gave out on Tuesday morning.  No known history of lumbar spinal disease or cerebrovascular disease.  Work-up in the ER showed normal labs, normal CT of the head as well as MRI of the brain.  Patient was seen by teleneurology and was suspected to have lumbar spinal pathology and an MRI of the lumbar spine was recommended.  MRI is currently not available at this facility and patient is being transferred to [facility] when bed is available.    The patient was transferred to [facility] to a telemetry bed. CT head and MRI brain are negative for acute CVA. CT of the lumbar and thoracic spine demonstrated an age-indeterminate mild superior endplate deformity at Q68 without retropulsion. There were mild degenerative changes of the lumbar spine without high grade canal or foraminal stenosis. MRI of the thoracic and lumbar spine demonstrated mild diffuse degenerative changes to the T spine. L-spine MRI has demonstrated L5-S1 facet degeneration and moderate subarticular stenosis. It has also demonstrated mild superior end-plate Fx T41 with marrow edema.   Echocardiogram: EF is 60-65%. LVH, LA is dilated, Mild MR.  Carotid Doppler: No hemodynamically significant stenosis. Right thyroid appears large and heterogenous.  Neurology has been consulted as has PT/OT.  Lipid panel has demonstrated LDL of 186. The patient has been started on Lipitor.  Repeat MRI has demonstrated a small  infarct of the 24mm early subacute or acute infarct. 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 MRA showed High-grade stenosis of V3-V4. There is also moderate stenosis of the Right ICA and left ICA. MRI of the C-spine demonate stenosis of the neural foramina.   Consultants  . Neurology  Procedures  . None  Antibiotics   Anti-infectives (From admission, onward)   None     Subjective  The patient is resting quietly. She has increased strength in the left upper extremity. She has had no change in her left lower extremity.  Objective   Vitals:  Vitals:   10/09/19 0714 10/09/19 1233  BP: (!) 171/66 (!) 155/90  Pulse: 67 84  Resp: 16 (!) 24  Temp: 97.9 F (36.6 C) 97.9 F (36.6 C)  SpO2: 99% 99%   Exam:  Constitutional:  . The patient is awake, alert, and oriented x 3. No acute distress. Respiratory:  . No increased work of breathing. . No wheezes, rales, or rhonchi . No tactile fremitus Cardiovascular:  . Regular rate and rhythm . No murmurs, ectopy, or gallups. . No lateral PMI. No thrills. Abdomen:  . Abdomen is soft, non-tender, non-distended . No hernias, masses, or organomegaly . Normoactive bowel sounds.  Musculoskeletal:  . No cyanosis, clubbing, or edema Skin:  . No rashes, lesions, ulcers . palpation of skin: no induration or nodules Neurologic:  . CN 2-12 intact . Sensation all 4 extremities intact . Improved strength of the left upper extremity. . Left lower extremity is unchanged.  Psychiatric:  .  Mental status o Mood, affect appropriate o Orientation to person, place, time  . judgment and insight appear intact  I have personally reviewed the following:   Today's Data  . Vitals, BMP, CBC  Imaging  . CT Lumbar and Thoracic Spine . CT brain . MRI brain, L-spine, T-spine . Carotid Doppler . MRI brain 10/08/2019, MRA brain, MRI C-spine  Cardiology Data   . EKG . Echocardiogram  Scheduled Meds: . amLODipine  5 mg Oral Daily  . aspirin EC  81 mg Oral Daily  . atorvastatin  80 mg Oral Daily  . enoxaparin (LOVENOX) injection  40 mg Subcutaneous Q24H  . lactulose  20 g Oral TID  . sodium chloride flush  3 mL Intravenous Q12H   Continuous Infusions: . sodium chloride    . lactated ringers 75 mL/hr at 10/05/19 2216    Principal Problem:   Weakness of left lower extremity Active Problems:   Left leg weakness   LOS: 2 days   A & P  Left Lower extremity weakness: CT and MRI brain negative for acute pathology. Neurology has recommended a stroke work up. We will do that. CT of lumbar and thoracic spine were negative. Will start on ASA. Stroke work up is negative. She has had progression of her symptoms to include weakness of her left upper extremity and mild facial droop. Neurology gives a differential diagnosis to include a small MRI negative stroke vs stuttering lacunar stroke vs transverse myelitis. He has ordered repeat non-contrast MRI brain, non-contrast MRI C-spine, and MRA brain. Repeat MRI has demonstrated a small infarct of the 64mm early subacute or acute infarct. 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 MRA showed High-grade stenosis of V3-V4. There is also moderate stenosis of the Right ICA and left ICA. MRI of the C-spine demonate stenosis of the neural foramina.   Abnormal appearance of thyroid: Appears heterogenous and enlarged on the right. Will order ultrasound of the thyroid. 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 No focal nodules are identified.  Hypertension: Not on any medications at home. As we are outside window, will continue the patient on Norvasc.  Prolonged QTc: Avoid QT prolonging medications. Check chemistry. Recheck EKG.   Ambulatory  dysfunction: PT/OT eval and treat. Recommendation is for CIR.  Advanced age: noted.  I have seen and examined this patient myself. I have spent 32 minutes in her evaluation and care.  Status is: Inpatient  Remains inpatient appropriate because: no safe discharge.   Dispo: The patient is from: Home              Anticipated d/c is to: CIR              Anticipated d/c date is: 2 days              Patient currently is not medically stable to d/c.   Robet Crutchfield, DO [facility] Direct contact: see [email or web address]  7PM-7AM contact night coverage as above 10/09/2019, 3:30 PM  LOS: 0 days

## 2019-10-09 NOTE — Consult Note (Signed)
Physical Medicine and Rehabilitation Consult   Reason for Consult: Stroke with functional deficits.  Referring Physician: Dr. Benny Lennert.    HPI: Melissa Holt is a 79 y.o. female with history of colon cancer, no medical care since colon surgery and has been in relatively good health till fall 1 weeks PTA. She developed bilateral hip pain and then started having difficulty standing due to BLE weakness.  She was found to have to have LLE weakness and transferred to [facility] 10/05/19 for work up and management.  MRI brain negative for acute changes. MRI lumbar and thoracic spine done revealing acute mild endplate T12 fracture without stenosis and L5-S1 anterolisthesis with facet degeneration and subarticular stenosis. MRI pelvis showed normal lumbosacral plexus with mild bilateral hip OA. 2D echo showed EF 60-65% with moderate LVH and mild MVR. Carotid dopplers without significant stenosis and incidental found to have enlarged heterogenous right thyroid.    She started developing progressive symptoms on 8/30 with LUE weakness and mild facial asymmetry. MRI brain done revealing 2 mm acute/early subacute infarct within ventral right medulla, chronic small vessel infarcts and moderate generalized parenchymal atrophy. 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  MRI C spine showed mild canal stenosis C3/4 and C4/5 and neural foraminal stenosis C4/C7/C8>C6. Therapy ongoing and patient limited by left sided weakness with dysarthria, poor awareness of deficits as well as delayed processing. CIR recommended due to functional deficits.    Review of Systems  Constitutional: Negative for chills and fever.  HENT: Negative for hearing loss and tinnitus.   Eyes: Negative for blurred vision.  Respiratory: Negative for cough and shortness of  breath.   Cardiovascular: Negative for chest pain and palpitations.  Gastrointestinal: Negative for constipation and heartburn.  Genitourinary: Negative for dysuria.  Musculoskeletal: Positive for back pain and myalgias.  Neurological: Positive for weakness. Negative for dizziness and headaches.  Psychiatric/Behavioral: Negative for memory loss. The patient does not have insomnia.      Past Medical History:  Diagnosis Date  . Cancer [facility])     Past Surgical History:  Procedure Laterality Date  . COLON SURGERY      Family History  Problem Relation Age of Onset  . Lung cancer Brother      Social History:  Lives alone--daughter live in neighborhood. 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 She reports that she has never smoked. Her smokeless tobacco use includes snuff. She reports current alcohol use. She reports that she does not use drugs.    Allergies: No Known Allergies    No medications prior to admission.    Home: Home Living Family/patient expects to be discharged to:: Private residence Living Arrangements: Alone Available Help at Discharge: Family, Available PRN/intermittently Type of Home: Mobile home Home Access: Stairs to enter [employer] of Steps: 4 Entrance Stairs-Rails: Right, Left Home Layout: One level Bathroom Shower/Tub: Chiropodist: Standard Home Equipment: Environmental consultant - 2 wheels  Functional History: Prior Function Level of Independence: Needs assistance Gait / Transfers Assistance Needed: Started using walker ~1 week ago ADL's / Homemaking Assistance Needed: typically independent until 1 week PTA, 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 Comments: weakness in LEs with buckling over last several weeks Functional Status:  Mobility: Bed Mobility Overal  bed mobility: Needs Assistance Bed Mobility: Rolling, Supine to Sit, Sit to Supine Rolling: Min  assist Supine to sit: Mod assist Sit to supine: Mod assist General bed mobility comments: min assist to complete and maintain roll to side, mod assist to raise trunk and for LEs back into bed Transfers Overall transfer level: Needs assistance Equipment used: None Transfer via Lift Equipment: Stedy Transfers: Sit to/from Stand Sit to Stand: Mod assist, +2 safety/equipment General transfer comment: assist to rise using stedy Ambulation/Gait Ambulation/Gait assistance: Min assist, +2 physical assistance General Gait Details: Pt as able to perform pre-gait activity while in [place] including heel raises, weight shifts, toe raises (R while standing, L while sitting)    ADL: ADL Overall ADL's : Needs assistance/impaired Eating/Feeding: Minimal assistance, Sitting Grooming: Minimal assistance, Wash/dry hands, Wash/dry face, Sitting Upper Body Bathing: Moderate assistance, Sitting Lower Body Bathing: Maximal assistance, Sit to/from stand Upper Body Dressing : Moderate assistance, Sitting Lower Body Dressing: Maximal assistance, Sit to/from stand Toilet Transfer: Moderate assistance, Stand-pivot, BSC Toileting- Clothing Manipulation and Hygiene: Total assistance, Sit to/from stand Toileting - Clothing Manipulation Details (indicate cue type and reason): pt with loose stools  Cognition: Cognition Overall Cognitive Status: Impaired/Different from baseline Orientation Level: Oriented X4, Oriented to person, Oriented to place, Oriented to time, Oriented to situation Cognition Arousal/Alertness: Awake/alert Behavior During Therapy: WFL for tasks assessed/performed Overall Cognitive Status: Impaired/Different from baseline Area of Impairment: Safety/judgement, Awareness, Problem solving Safety/Judgement: Decreased awareness of safety, Decreased awareness of deficits Awareness: Emergent Problem Solving: Requires verbal cues, Slow processing, Difficulty sequencing General Comments: pt denies  changes to her speech, aware of weakness in L UE increasing  Blood pressure (!) 155/90, pulse 84, temperature 97.9 F (36.6 C), temperature source Oral, resp. rate (!) 24, height 4\' 11"  (1.499 m), weight 67.6 kg, SpO2 99 %. General: Alert and oriented x 3, No apparent distress HEENT: Head is normocephalic, atraumatic, PERRLA, EOMI, sclera anicteric, oral mucosa pink and moist, dentition intact, ext ear canals clear,  Neck: Supple without JVD or lymphadenopathy Heart: Reg rate and rhythm. No murmurs rubs or gallops Chest: CTA bilaterally without wheezes, rales, or rhonchi; no distress Abdomen: Soft, non-tender, non-distended, bowel sounds positive. Extremities: No clubbing, cyanosis, or edema. Pulses are 2+ Skin: IV in place Neurological:     Mental Status: She is alert and oriented to person, place, and time.     Comments: Left facial droop. Dysarthric speech with occasional wet voice. Able to follow basic commands without difficulty.  LLE 2/5 HF, KE, DF, PF. LUE 4/5 throughout Psychiatric:        Mood and Affect: Mood normal.   Results for orders placed or performed during the hospital encounter of 10/05/19 (from the past 24 hour(s))  Hemoglobin A1c     Status: Abnormal   Collection Time: 10/09/19  5:08 AM  Result Value Ref Range   Hgb A1c MFr Bld 5.8 (H) 4.8 - 5.6 %   Mean Plasma Glucose 119.76 mg/dL   DG Abd 1 View  Result Date: 10/08/2019 CLINICAL DATA:  In-patient, abdominal pain EXAM: ABDOMEN - 1 VIEW COMPARISON:  Thoracic and lumbar spine imaging 10/06/2019 FINDINGS: Paucity of small bowel gas. No high-grade obstructive bowel gas pattern. Air-filled appearance of the colon without large colonic stool burden. Atelectatic changes present in the lung bases. No suspicious calcifications over the urinary tract or abdomen. Cholecystectomy clips in the right upper quadrant. Degenerative changes in the spine hips and pelvis. T12 compression fracture better seen on comparison cross-sectional  imaging. No other acute or suspicious osseous abnormalities. Soft tissues are unremarkable. Telemetry leads project over the lower chest and abdomen. IMPRESSION: Air-filled appearance of the colon without large colonic stool burden. No high-grade obstructive bowel gas pattern. Electronically Signed   By: Lovena Le M.D.   On: 10/08/2019 22:29   MR ANGIO HEAD WO CONTRAST  Result Date: 10/08/2019 CLINICAL DATA:  Neuro deficit, acute, stroke suspected. Additional history obtained from [place] reports left arm weakness progressing to leg, unable to lift leg off bed, dysarthric speech, possible mild facial asymmetry. EXAM: MRI HEAD WITHOUT CONTRAST MRA HEAD WITHOUT CONTRAST TECHNIQUE: Multiplanar, multiecho pulse sequences of the brain and surrounding structures were obtained without intravenous contrast. Angiographic images of the head were obtained using MRA technique without contrast. COMPARISON:  Brain MRI 10/05/2019, head CT 10/05/2019 FINDINGS: MRI HEAD FINDINGS Brain: Intermittent motion degradation. Most notably, there is moderate motion degradation of the coronal T2 weighted sequence. There is a 2 mm focus of restricted diffusion within the ventral right medulla consistent with acute/early subacute infarction (series 5, image 57). In retrospect, this was likely present on the prior MRI of 10/05/2019, although poorly demonstrated on the prior study. Stable, moderate generalized parenchymal atrophy. As before, there are chronic lacunar infarcts within the right corona radiata, basal ganglia, thalami and right middle cerebellar peduncle. Stable background moderate chronic small vessel ischemic changes within the cerebral white matter. No evidence of intracranial mass. No chronic intracranial blood products. No extra-axial fluid collection. No midline shift. Vascular: Reported below. Skull and upper cervical spine: No focal marrow lesion. Sinuses/Orbits: Visualized orbits show no  acute finding. Mild ethmoid sinus mucosal thickening. Small left maxillary sinus mucous retention cyst. No significant mastoid effusion. MRA HEAD FINDINGS The intracranial internal carotid arteries. Significant atherosclerotic irregularity of both vessels with multifocal stenoses. Most notably, there is moderate to moderately severe stenosis of the cavernous/paraclinoid right internal carotid artery and up to moderate stenosis of the cavernous left internal carotid artery. There are multiple vascular outpouchings arising from the intracranial internal carotid arteries bilaterally which may reflect aneurysms or vessel lobulation from atherosclerotic disease. This includes a 3 mm inferiorly projecting vascular protrusion arising from the proximal cavernous right ICA (series 1089, image 4), a 1-2 mm inferiorly projecting vascular protrusion arising from the proximal cavernous left ICA (series 1089, image 17). A 2 mm vascular protrusion arising from the supraclinoid right ICA has an appearance most suggestive of an infundibulum, although a small aneurysm is difficult to definitively exclude. Additional vascular protrusions arising from the paraclinoid internal carotid arteries bilaterally appear to reflect the ophthalmic artery origins (for instance as seen on series 8 9, images 4 and 13). The M1 middle cerebral arteries are patent with sites of mild stenosis bilaterally. Additionally, there is atherosclerotic irregularity of the M2 and more distal MCA branches bilaterally. However, no M2 proximal branch occlusion or high-grade proximal stenosis is identified. The anterior cerebral arteries are patent. There are moderate/severe focal stenoses at the origins of both A1 segments. The V3 and V4 right vertebral artery is poorly delineated, likely secondary to high-grade stenosis or occlusion. The intracranial left vertebral artery is patent with sites of up to moderate stenosis proximally and distally. 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 Mild atherosclerotic narrowing within the proximal basilar artery. The mid and distal basilar artery is patent without significant stenosis. The posterior cerebral arteries are patent bilaterally with diffuse mild-to-moderate atherosclerotic irregularity.  IMPRESSION: MRI brain: 1. Intermittently motion degraded examination. 2. 2 mm acute/early subacute infarct within the ventral right medulla. 3. Redemonstrated chronic small-vessel infarcts within the right corona radiata, bilateral basal ganglia, thalami and right middle cerebellar peduncle. 4. Stable background moderate generalized parenchymal atrophy and chronic small vessel ischemic disease. 5. Mild ethmoid sinus mucosal thickening. 6. Small left maxillary sinus mucous retention cyst. MRA head: 1. Intracranial atherosclerotic disease with multifocal stenoses, as detailed and most notably as follows. 2. The V3 and V4 right vertebral artery is poorly delineated, likely secondary to high-grade stenosis or occlusion. 3. Sites of up to moderate stenosis within the V4 left vertebral artery proximally and distally. 4. Moderate to moderately severe stenosis of the cavernous/paraclinoid right ICA. 5. Moderate stenosis of the cavernous left ICA. 6. Moderate/severe stenoses within the proximal A1 anterior cerebral arteries bilaterally. 7. 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 8. 3 mm vascular protrusion arising from the proximal cavernous right ICA. 1-2 mm vascular protrusion arising from the proximal cavernous left ICA. These may reflect vessel lobulation from atherosclerotic disease or small aneurysms. 9. 2 mm vascular protrusion arising from the supraclinoid right ICA. This has an appearance most suggestive of an infundibulum, although a small aneurysm at this site is difficult to exclude. Electronically Signed   By: Kellie Simmering DO   On: 10/08/2019 20:43    MR BRAIN WO CONTRAST  Result Date: 10/08/2019 CLINICAL DATA:  Neuro deficit, acute, stroke suspected. Additional history obtained from [place] reports left arm weakness progressing to leg, unable to lift leg off bed, dysarthric speech, possible mild facial asymmetry. EXAM: MRI HEAD WITHOUT CONTRAST MRA HEAD WITHOUT CONTRAST TECHNIQUE: Multiplanar, multiecho pulse sequences of the brain and surrounding structures were obtained without intravenous contrast. Angiographic images of the head were obtained using MRA technique without contrast. COMPARISON:  Brain MRI 10/05/2019, head CT 10/05/2019 FINDINGS: MRI HEAD FINDINGS Brain: Intermittent motion degradation. Most notably, there is moderate motion degradation of the coronal T2 weighted sequence. There is a 2 mm focus of restricted diffusion within the ventral right medulla consistent with acute/early subacute infarction (series 5, image 57). In retrospect, this was likely present on the prior MRI of 10/05/2019, although poorly demonstrated on the prior study. Stable, moderate generalized parenchymal atrophy. As before, there are chronic lacunar infarcts within the right corona radiata, basal ganglia, thalami and right middle cerebellar peduncle. Stable background moderate chronic small vessel ischemic changes within the cerebral white matter. No evidence of intracranial mass. No chronic intracranial blood products. No extra-axial fluid collection. No midline shift. Vascular: Reported below. Skull and upper cervical spine: No focal marrow lesion. Sinuses/Orbits: Visualized orbits show no acute finding. Mild ethmoid sinus mucosal thickening. Small left maxillary sinus mucous retention cyst. No significant mastoid effusion. MRA HEAD FINDINGS The intracranial internal carotid arteries. Significant atherosclerotic irregularity of both vessels with multifocal stenoses. Most notably, there is moderate to moderately severe stenosis of the  cavernous/paraclinoid right internal carotid artery and up to moderate stenosis of the cavernous left internal carotid artery. There are multiple vascular outpouchings arising from the intracranial internal carotid arteries bilaterally which may reflect aneurysms or vessel lobulation from atherosclerotic disease. This includes a 3 mm inferiorly projecting vascular protrusion arising from the proximal cavernous right ICA (series 1089, image 4), a 1-2 mm inferiorly projecting vascular protrusion arising from the proximal cavernous left ICA (series 1089, image 17). A 2 mm vascular protrusion arising from the supraclinoid right ICA has an appearance most  suggestive of an infundibulum, although a small aneurysm is difficult to definitively exclude. Additional vascular protrusions arising from the paraclinoid internal carotid arteries bilaterally appear to reflect the ophthalmic artery origins (for instance as seen on series 8 9, images 4 and 13). The M1 middle cerebral arteries are patent with sites of mild stenosis bilaterally. Additionally, there is atherosclerotic irregularity of the M2 and more distal MCA branches bilaterally. However, no M2 proximal branch occlusion or high-grade proximal stenosis is identified. The anterior cerebral arteries are patent. There are moderate/severe focal stenoses at the origins of both A1 segments. The V3 and V4 right vertebral artery is poorly delineated, likely secondary to high-grade stenosis or occlusion. The intracranial left vertebral artery is patent with sites of up to moderate stenosis proximally and distally. 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 Mild atherosclerotic narrowing within the proximal basilar artery. The mid and distal basilar artery is patent without significant stenosis. The posterior cerebral arteries are patent bilaterally with diffuse mild-to-moderate atherosclerotic irregularity. IMPRESSION: MRI  brain: 1. Intermittently motion degraded examination. 2. 2 mm acute/early subacute infarct within the ventral right medulla. 3. Redemonstrated chronic small-vessel infarcts within the right corona radiata, bilateral basal ganglia, thalami and right middle cerebellar peduncle. 4. Stable background moderate generalized parenchymal atrophy and chronic small vessel ischemic disease. 5. Mild ethmoid sinus mucosal thickening. 6. Small left maxillary sinus mucous retention cyst. MRA head: 1. Intracranial atherosclerotic disease with multifocal stenoses, as detailed and most notably as follows. 2. The V3 and V4 right vertebral artery is poorly delineated, likely secondary to high-grade stenosis or occlusion. 3. Sites of up to moderate stenosis within the V4 left vertebral artery proximally and distally. 4. Moderate to moderately severe stenosis of the cavernous/paraclinoid right ICA. 5. Moderate stenosis of the cavernous left ICA. 6. Moderate/severe stenoses within the proximal A1 anterior cerebral arteries bilaterally. 7. 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 8. 3 mm vascular protrusion arising from the proximal cavernous right ICA. 1-2 mm vascular protrusion arising from the proximal cavernous left ICA. These may reflect vessel lobulation from atherosclerotic disease or small aneurysms. 9. 2 mm vascular protrusion arising from the supraclinoid right ICA. This has an appearance most suggestive of an infundibulum, although a small aneurysm at this site is difficult to exclude. Electronically Signed   By: Kellie Simmering DO   On: 10/08/2019 20:43   MR CERVICAL SPINE WO CONTRAST  Result Date: 10/08/2019 CLINICAL DATA:  Left upper and lower extremity EXAM: MRI CERVICAL SPINE WITHOUT CONTRAST TECHNIQUE: Multiplanar, multisequence MR imaging of the cervical spine was performed. No intravenous contrast was administered. COMPARISON:  None. FINDINGS: Alignment: Physiologic. Vertebrae: Mildly  heterogeneous marrow signal.  Acute abnormality. Cord: Normal Posterior Fossa, vertebral arteries, paraspinal tissues: Abnormal right vertebral artery flow void. Disc levels: C1-2: Unremarkable. C2-3: Left-greater-than-right facet hypertrophy. Small disc bulge. There is no spinal canal stenosis. No neural foraminal stenosis. C3-4: Intermediate sized disc bulge with bilateral large uncovertebral osteophytes. Mild spinal canal stenosis. Severe bilateral neural foraminal stenosis. C4-5: Right-greater-than-left uncovertebral hypertrophy with intermediate disc bulge. Mild spinal canal stenosis. Severe right and moderate left neural foraminal stenosis. C5-6: Uncovertebral hypertrophy with small disc bulge. There is no spinal canal stenosis. Moderate bilateral neural foraminal stenosis. C6-7: Broad disc bulge with bilateral uncovertebral hypertrophy. There is no spinal canal stenosis. Severe bilateral neural foraminal stenosis. C7-T1: Small disc bulge small bilateral uncovertebral osteophytes. There is no spinal canal stenosis. Severe bilateral neural  foraminal stenosis. IMPRESSION: 1. Severe bilateral C4, C7 and C8 neural foraminal stenosis. 2. Mild spinal canal stenosis at C3-4 and C4-5. 3. Moderate bilateral C6 neural foraminal stenosis. Electronically Signed   By: Ulyses Jarred M.D.   On: 10/08/2019 20:20   US THYROID  Result Date: 10/08/2019 CLINICAL DATA:  Incidental on CT. Goiter with enlarged right lobe of thyroid by CT of the thoracic spine. EXAM: THYROID ULTRASOUND TECHNIQUE: Ultrasound examination of the thyroid gland and adjacent soft tissues was performed. COMPARISON:  CT of the thoracic spine on 10/05/2019 FINDINGS: Parenchymal Echotexture: Moderately heterogenous Isthmus: 0.3 cm Right lobe: 7.3 x 2.9 x 5.4 cm Left lobe: 2.7 x 0.6 x 1.2 cm _________________________________________________________ Estimated total number of nodules >/= 1 cm: 0 Number of spongiform nodules >/=  2 cm not described below  (TR1): 0 Number of mixed cystic and solid nodules >/= 1.5 cm not described below (TR2): 0 _________________________________________________________ No discrete nodules are seen within the thyroid gland. The right lobe is enlarged and heterogeneous in appearance without discrete nodule. There are some areas dystrophic shadowing calcification as seen by CT. 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 No focal nodules are identified. The above is in keeping with the ACR TI-RADS recommendations - J Am Coll Radiol 2017;14:587-595. Electronically Signed   By: Aletta Edouard M.D.   On: 10/08/2019 09:52     Assessment/Plan: Diagnosis: Right ventral medullar infarct.  1. Does the need for close, 24 hr/day medical supervision in concert with the patient's rehab needs make it unreasonable for this patient to be served in a less intensive setting? Yes 2. Co-Morbidities requiring supervision/potential complications: HTN, HLD, obesity,  Hypokalemia, ETOH use, severe bilateral C4/C7/C8 neural foraminal stenosis 3. Due to bladder management, bowel management, safety, skin/wound care, disease management, medication administration, pain management and patient education, does the patient require 24 hr/day rehab nursing? Yes 4. Does the patient require coordinated care of a physician, rehab nurse, therapy disciplines of PT, OT to address physical and functional deficits in the context of the above medical diagnosis(es)? Yes Addressing deficits in the following areas: balance, endurance, locomotion, strength, transferring, bowel/bladder control, bathing, dressing, feeding, grooming, toileting and psychosocial support 5. Can the patient actively participate in an intensive therapy program of at least 3 hrs of therapy per day at least 5 days per week? Yes 6. The potential for patient to make measurable gains while on inpatient rehab is  excellent 7. Anticipated functional outcomes upon discharge from inpatient rehab are modified independent  with PT, modified independent with OT, independent with SLP. 8. Estimated rehab length of stay to reach the above functional goals is: 10-14 days 9. Anticipated discharge destination: Home 10. Overall Rehab/Functional Prognosis: excellent  RECOMMENDATIONS: This patient's condition is appropriate for continued rehabilitative care in the following setting: CIR Patient has agreed to participate in recommended program. Yes Note that insurance prior authorization may be required for reimbursement for recommended care.  Comment: Mrs. Nay would be an excellent CIR candidate. She lives alone and has [place], PA-C 10/09/2019   I have personally performed a face to face diagnostic evaluation, including, but not limited to relevant history and physical exam findings, of this patient and developed relevant assessment and plan.  Additionally, I have reviewed and concur with the physician assistant's documentation above.  Leeroy Cha, MD

## 2019-10-09 NOTE — Progress Notes (Signed)
Inpatient Rehabilitation Admissions Coordinator  Inpatient rehab consult received. I met with patient at bedside for rehab assessment. We discussed goals and expectations of an inpt rehab admit. She prefers CIR rather than SNF. I will begin insurance authorization with [facility] for a possible admit.  Danne Baxter, RN, MSN Rehab Admissions Coordinator [phone] 10/09/2019 2:53 PM

## 2019-10-09 NOTE — Progress Notes (Signed)
STROKE TEAM PROGRESS NOTE   HISTORY OF PRESENT ILLNESS (per record) Melissa Holt is a 79 y.o. female with a past medical history of colon cancer approximately 5 years ago.  She states that on Tuesday she was walking out of the bathroom and her leg suddenly gave out - she landed on her butt in a somewhat seated position. She denies any LOC or dizziness and did not hit her head. Also with no numbness or tingling prior to or after the episode. She states she was unable to get up, needing assistance to do so, and since that time has needed to use a walker.  She also endorses having a prior fall ~ 2 weeks ago but was able to ambulate fine until the fall this past Tuesday.  She endorses having some low back pain.   CT head showed no acute abnormality. MRI head also with no acute abnormality. CT lumbar spine with mild superior endplate deformity at P29. CT thoracic spine with no evidence for acute osseous abnormality.   She denies any recent illness, fevers, chills, N/V/D.  Received COVID vaccine in May and no others since. She also denies CP, SOB, headache, numbness, tingling, vision abnormalities or speech deficits.     INTERVAL HISTORY I have personally reviewed history of present illness, electronic medical records and imaging films in PACS.  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  She has a prior history of TIA and was on aspirin following that.  Carotid ultrasound is unremarkable transthoracic echo shows normal ejection fraction without cardiac source of embolism.  Hemoglobin A1c is 5.8.  LDL cholesterol is 186.  She states her left leg weakness persists though it is slightly improved.  No new complaints    OBJECTIVE Vitals:   10/08/19 1930 10/09/19 0025 10/09/19 0308 10/09/19 0714  BP: (!)  151/69 138/85 (!) 155/78 (!) 171/66  Pulse: 68 68 80 67  Resp: 17 17 17 16   Temp: 97.8 F (36.6 C) 97.9 F (36.6 C) 98.2 F (36.8 C) 97.9 F (36.6 C)  TempSrc: Oral Oral Oral Oral  SpO2: 98% 96% 95% 99%  Weight:      Height:        CBC:  Recent Labs  Lab 10/05/19 1455 10/08/19 0114  WBC 8.4 6.9  NEUTROABS 4.5 3.5  HGB 13.4 13.1  HCT 42.2 40.0  MCV 97.5 96.2  PLT 189 518    Basic Metabolic Panel:  Recent Labs  Lab 10/05/19 1455 10/08/19 0114  NA 137 138  K 4.0 3.4*  CL 104 105  CO2 22 22  GLUCOSE 95 101*  BUN 14 8  CREATININE 0.93 0.70  CALCIUM 9.5 9.4    Lipid Panel:     Component Value Date/Time   CHOL 272 (H) 10/06/2019 1508   TRIG 93 10/06/2019 1508   HDL 67 10/06/2019 1508   CHOLHDL 4.1 10/06/2019 1508   VLDL 19 10/06/2019 1508   LDLCALC 186 (H) 10/06/2019 1508   HgbA1c:  Lab Results  Component Value Date   HGBA1C 5.8 (H) 10/09/2019   Urine Drug Screen: No results found for: LABOPIA, COCAINSCRNUR, LABBENZ, AMPHETMU, THCU, LABBARB  Alcohol Level No results found for: ETH  IMAGING  DG Abd 1 View 10/08/2019 IMPRESSION: Air-filled appearance of the colon without large  colonic stool burden. No high-grade obstructive bowel gas pattern.   CT Head WO Contrast 10/05/19 IMPRESSION: No acute finding by CT. Age related atrophy and chronic small-vessel ischemic changes of the white matter.  MR ANGIO HEAD WO CONTRAST 10/08/2019 IMPRESSION:   MRI brain:  1. Intermittently motion degraded examination.  2. 2 mm acute/early subacute infarct within the ventral right medulla.  3. Redemonstrated chronic small-vessel infarcts within the right corona radiata, bilateral basal ganglia, thalami and right middle cerebellar peduncle.  4. Stable background moderate generalized parenchymal atrophy and chronic small vessel ischemic disease.  5. Mild ethmoid sinus mucosal thickening.  6. Small left maxillary sinus mucous retention cyst.   MRA head:  1.  Intracranial atherosclerotic disease with multifocal stenoses, as detailed and most notably as follows.  2. The V3 and V4 right vertebral artery is poorly delineated, likely secondary to high-grade stenosis or occlusion.  3. Sites of up to moderate stenosis within the V4 left vertebral artery proximally and distally.  4. Moderate to moderately severe stenosis of the cavernous/paraclinoid right ICA.  5. Moderate stenosis of the cavernous left ICA.  6. Moderate/severe stenoses within the proximal A1 anterior cerebral arteries bilaterally.  7. 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  8. 3 mm vascular protrusion arising from the proximal cavernous right ICA. 1-2 mm vascular protrusion arising from the proximal cavernous left ICA. These may reflect vessel lobulation from atherosclerotic disease or small aneurysms.  9. 2 mm vascular protrusion arising from the supraclinoid right ICA. This has an appearance most suggestive of an infundibulum, although a small aneurysm at this site is difficult to exclude.   MR CERVICAL SPINE WO CONTRAST 10/08/2019 IMPRESSION:  1. Severe bilateral C4, C7 and C8 neural foraminal stenosis.  2. Mild spinal canal stenosis at C3-4 and C4-5.  3. Moderate bilateral C6 neural foraminal stenosis.   MR Thoracic Spine WO Contrast 10/06/19 IMPRESSION:  Mild superior endplate fracture C16 with bone marrow edema consistent with a recent fracture. No associated spinal stenosis.   No other fracture  Mild diffuse degenerative change in the thoracic spine.  MR Lumbar Spine WO Contrast 10/06/19 IMPRESSION:  Mild superior endplate fracture of L84 with bone marrow edema consistent with acute fracture. Negative for spinal stenosis.   Nother fracture  3 mm anterolisthesis L5-S1 with severe facet degeneration and oderate subarticular stenosis bilaterally.   Mild subarticular stenosis bilaterally at L4-5  MR PELVIS W WO CONTRAST 10/07/2019 IMPRESSION:   1. Normal lumbosacral plexus.  2. Incompletely visualized acute T12 superior endplate compression fracture, better evaluated on spine MRI from yesterday.  3. Mild bilateral hip osteoarthritis. Small right paralabral cyst consistent with underlying labral tear.  4. Bilateral hamstring tendinosis.   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 No focal nodules are identified.   VAS US CAROTID 10/07/2019 Summary:  Incidental: Enlarged, heterogenous RT thyroid  Right Carotid: Velocities in the right ICA are consistent with a 1-39% stenosis.  Left Carotid: Velocities in the left ICA are consistent with a 1-39% stenosis.  Vertebrals:  Bilateral vertebral arteries demonstrate antegrade flow.  Subclavians: Normal flow hemodynamics were seen in bilateral subclavian arteries. Final    Transthoracic Echocardiogram  Left ventricular ejection fraction 60 to 65%.  No cardiac source of embolism. Bilateral Carotid Dopplers  Bilateral 1-39% carotid stenosis.  Both vertebral artery flow is antegrade  ECG - SR rate 78 BPM. (See cardiology reading for complete  details)  PHYSICAL EXAM Blood pressure (!) 171/66, pulse 67, temperature 97.9 F (36.6 C), temperature source Oral, resp. rate 16, height 4\' 11"  (1.499 m), weight 67.6 kg, SpO2 99 %. Present elderly lady not in distress. . Afebrile. Head is nontraumatic. Neck is supple without bruit.    Cardiac exam no murmur or gallop. Lungs are clear to auscultation. Distal pulses are well felt.  Neurological Exam :  Awake alert oriented x 3 normal speech and language. Mild left lower face asymmetry. Tongue midline. No drift. Mild diminished fine finger movements on left. Orbits right over left upper extremity. Mild left grip weak.. Left lower extremity strength 3/5 with weakness of hip flexors and ankle dorsiflexors.  Sensation is preserved  bilaterally.  Normal sensation . Normal coordination.   ASSESSMENT/PLAN [name] is a 79 y.o. female with a history of colon cancer approximately 5 years ago and a fall two weeks ago -> back pain and difficulty walking. Tuesday she was walking out of the bathroom and her leg suddenly gave out. She did not receive IV t-PA due to late presentation (>4.5 hours from time of onset).  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   Resultant left leg weakness   code Stroke CT Head - not ordered  CT head - No acute finding by CT. Age related atrophy and chronic small-vessel ischemic changes of the white matter.  MRI head - 2 mm acute/early subacute infarct within the ventral right medulla. Chronic small-vessel infarcts within the right corona radiata, bilateral basal ganglia, thalami and right middle cerebellar peduncle.  MRA head - The V3 and V4 right vertebral artery is poorly delineated, likely secondary to high-grade stenosis or occlusion. Moderate to moderately severe stenosis of the cavernous/paraclinoid right ICA. Moderate/severe stenoses within the proximal A1 anterior cerebral arteries bilaterally.   CTA H&N - not ordered  CT Perfusion - not ordered  Carotid Doppler - pending  2D Echo - pending  Sars Corona Virus 2 - negative  LDL - 186  HgbA1c - 5.8  UDS - not ordered  VTE prophylaxis - Lovenox Diet  Diet Order            Diet regular Room service appropriate? Yes; Fluid consistency: Thin  Diet effective now                 On aspirinc prior to admission, now on aspirin 81 mg daily and Plavix 75 mg daily into 3 weeks followed by Plavix alone patient counseled to be compliant with her antithrombotic medications  Ongoing aggressive stroke risk factor management  Therapy recommendations:  Possible CIR candidate  Disposition:  Pending  Hypertension  Home BP meds: none   Current BP meds: amlodipine ;  Labetalol prn  Stable . Permissive hypertension (OK if < 220/120) but gradually normalize in 5-7 days  . Long-term BP goal normotensive  Hyperlipidemia  Home Lipid lowering medication: none   LDL 186, goal < 70  Current lipid lowering medication: Lipitor 40 mg daily   Continue statin at discharge  Other Stroke Risk Factors  Advanced age  Uses snuff - never smoked  ETOH use, advised to drink no more than 1 alcoholic beverage per day.  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   Mild superior endplate fracture of D47 with bone marrow edema consistent with acute fracture.   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 No focal nodules are identified.    Hospital day # 2  She presented subacute left leg weakness secondary to right middle infarct likely from small vessel disease.  Recommend physical occupational therapy consults.  Mobilize out of bed.  Likely will need transfer to rehab.  Recommend aspirin and Plavix for 3 weeks followed by Plavix alone and she was already on aspirin.  Aggressive risk factors modification.  Long discussion with patient and with Dr. Karie Kirks.  Greater than 50% time during this 35-minute visit was spent on counseling and coordination of care about stroke and discussion about stroke prevention and treatment and answering questions.  Follow-up as an outpatient stroke clinic in 6 weeks.  Stroke team will sign off.  Kindly call for questions. Antony Contras, MD To contact Stroke Continuity  provider, please refer to [email or web address] After hours, contact General Neurology

## 2019-10-09 NOTE — Progress Notes (Signed)
Patient had an order for restraint. The nurse assessed the patient to be nonviolent, follow commands, alert and oriented x4. I paged the MD about the order. The order was discontinued.

## 2019-10-10 LAB — TSH: TSH: 3.576 u[IU]/mL (ref 0.350–4.500)

## 2019-10-10 MED ORDER — SENNOSIDES-DOCUSATE SODIUM 8.6-50 MG PO TABS
2.0000 | ORAL_TABLET | Freq: Two times a day (BID) | ORAL | Status: DC
  Administered 2019-10-10: 2 via ORAL
  Filled 2019-10-10: qty 2

## 2019-10-10 MED ORDER — POLYETHYLENE GLYCOL 3350 17 G PO PACK
17.0000 g | PACK | Freq: Every day | ORAL | Status: DC | PRN
  Administered 2019-10-12: 17 g via ORAL
  Filled 2019-10-10: qty 1

## 2019-10-10 NOTE — Progress Notes (Signed)
PROGRESS NOTE    Melissa Holt  UPJ:[id number] DOB: 06/03/1940 DOA: 10/05/2019 PCP: Patient, No Pcp Per   Brief Narrative:  79 year old with history of ambulatory dysfunction, colon cancer, dementia, essential hypertension initially presented to the hospital for left lower extremity weakness 4 days prior to admission but intermittent falls at home 2 weeks prior to that.  Work-up in the ER was negative for CVA including CT head and MRI brain.  Telemetry neuro suspected lumbar pathology therefore MRI lumbar spine and CT lumbar and thoracic spine was ordered.  This demonstrated lumbar sacral stenosis, mild superior endplate fracture F29 with marrow edema.  Echocardiogram showed EF 60 to 65%, carotid Dopplers did not show stenosis but large slightly abnormal appearing thyroid.  LDL 186.  Due to change in mental status, repeat MRI was done by neurology which demonstrated small acute/subacute CVA with chronic lacunar infarct.  MRA showed high-grade V3-V4 stenosis, moderate stenosis of the right and left ICA.  MRI C-spine showed foraminal stenosis.   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 -Seen by neurology team -PT/OT recommending CIR.  Abnormal appearance of thyroid/goiter -No focal nodules.  Follow-up outpatient with PCP. -TSH is within normal limits.  Constipation with large stool burden -Twice daily senna.  MiraLAX as needed.  Out of bed to chair.  Essential hypertension -On Norvasc  Hyperlipidemia -Uncontrolled, Lipitor 40 mg daily  Prolonged QTC -Resolved     DVT prophylaxis: Lovenox Code Status: Full code Family Communication: None at bedside  Status is: Inpatient  Remains inpatient  appropriate because:IV treatments appropriate due to intensity of illness or inability to take PO   Dispo: The patient is from: Home              Anticipated d/c is to: CIR              Anticipated d/c date is: 1 day              Patient currently is medically stable to d/c. Patient is medically stable to be transitioned to CIR when bed is available.       Body mass index is 30.01 kg/m.        Subjective: Still having left lower extremity weakness but denies any other complaints.  Review of Systems Otherwise negative except as per HPI, including: General: Denies fever, chills, night sweats or unintended weight loss. Resp: Denies cough, wheezing, shortness of breath. Cardiac: Denies chest pain, palpitations, orthopnea, paroxysmal nocturnal dyspnea. GI: Denies abdominal pain, nausea, vomiting, diarrhea or constipation GU: Denies dysuria, frequency, hesitancy or incontinence MS: Denies muscle aches, joint pain or swelling Neuro: Denies headache Psych: Denies anxiety, depression, SI/HI/AVH Skin: Denies new rashes or lesions ID: Denies sick contacts, exotic exposures, travel  Examination:  General exam: Appears calm and comfortable  Respiratory system: Clear to auscultation. Respiratory effort normal. Cardiovascular system: S1 & S2 heard, RRR. No JVD, murmurs, rubs, gallops or clicks. No pedal edema. Gastrointestinal system: Abdomen is nondistended, soft and nontender. No organomegaly or masses felt. Normal bowel sounds heard. Central nervous system: Alert and oriented. No focal neurological deficits. Extremities: Left lower extremity strength 3/5, right lower extremity strength 4/5, bilateral upper extremity strength 5/5. Skin: No rashes, lesions  or ulcers Psychiatry: Judgement and insight appear normal. Mood & affect appropriate.     Objective: Vitals:   10/10/19 0500 10/10/19 0710 10/10/19 0759 10/10/19 1123  BP:   (!) 155/79 (!) 141/69  Pulse:   70 69  Resp:  12  20 18   Temp:   98 F (36.7 C) 98.5 F (36.9 C)  TempSrc:   Oral Oral  SpO2:   96% 98%  Weight: 67.4 kg     Height:        Intake/Output Summary (Last 24 hours) at 10/10/2019 1153 Last data filed at 10/10/2019 0800 Gross per 24 hour  Intake 123 ml  Output 600 ml  Net -477 ml   Filed Weights   10/07/19 0244 10/08/19 0438 10/10/19 0500  Weight: 68.3 kg 67.6 kg 67.4 kg     Data Reviewed:   CBC: Recent Labs  Lab 10/05/19 1455 10/08/19 0114  WBC 8.4 6.9  NEUTROABS 4.5 3.5  HGB 13.4 13.1  HCT 42.2 40.0  MCV 97.5 96.2  PLT 189 101   Basic Metabolic Panel: Recent Labs  Lab 10/05/19 1455 10/08/19 0114  NA 137 138  K 4.0 3.4*  CL 104 105  CO2 22 22  GLUCOSE 95 101*  BUN 14 8  CREATININE 0.93 0.70  CALCIUM 9.5 9.4   GFR: Estimated Creatinine Clearance: 48.4 mL/min (by C-G formula based on SCr of 0.7 mg/dL). Liver Function Tests: Recent Labs  Lab 10/05/19 1455  AST 21  ALT 16  ALKPHOS 67  BILITOT 0.5  PROT 7.7  ALBUMIN 4.1   No results for input(s): LIPASE, AMYLASE in the last 168 hours. No results for input(s): AMMONIA in the last 168 hours. Coagulation Profile: No results for input(s): INR, PROTIME in the last 168 hours. Cardiac Enzymes: No results for input(s): CKTOTAL, CKMB, CKMBINDEX, TROPONINI in the last 168 hours. BNP (last 3 results) No results for input(s): PROBNP in the last 8760 hours. HbA1C: Recent Labs    10/09/19 0508  HGBA1C 5.8*   CBG: No results for input(s): GLUCAP in the last 168 hours. Lipid Profile: No results for input(s): CHOL, HDL, LDLCALC, TRIG, CHOLHDL, LDLDIRECT in the last 72 hours. Thyroid Function Tests: Recent Labs    10/10/19 0508  TSH 3.576   Anemia Panel: No results for input(s): VITAMINB12, FOLATE, FERRITIN, TIBC, IRON, RETICCTPCT in the last 72 hours. Sepsis Labs: No results for input(s): PROCALCITON, LATICACIDVEN in the last 168 hours.  Recent Results (from the past 240 hour(s))  SARS Coronavirus 2 by  RT PCR (hospital order, performed in [facility] hospital lab) Nasopharyngeal Nasopharyngeal Swab     Status: None   Collection Time: 10/05/19  8:30 PM   Specimen: Nasopharyngeal Swab  Result Value Ref Range Status   SARS Coronavirus 2 NEGATIVE NEGATIVE Final    Comment: (NOTE) SARS-CoV-2 target nucleic acids are NOT DETECTED.  The SARS-CoV-2 RNA is generally detectable in upper and lower respiratory specimens during the acute phase of infection. The lowest concentration of SARS-CoV-2 viral copies this assay can detect is 250 copies / mL. A negative result does not preclude SARS-CoV-2 infection and should not be used as the sole basis for treatment or other patient management decisions.  A negative result may occur with improper specimen collection / handling, submission of specimen other than nasopharyngeal swab, presence of viral mutation(s) within the areas targeted by this assay, and inadequate number of viral copies (<250 copies / mL). A negative result must be combined with clinical observations,  patient history, and epidemiological information.  Fact Sheet for Patients:   StrictlyIdeas.no  Fact Sheet for Healthcare Providers: BankingDealers.co.za  This test is not yet approved or  cleared by the [place] FDA and has been authorized for detection and/or diagnosis of SARS-CoV-2 by FDA under an Emergency Use Authorization (EUA).  This EUA will remain in effect (meaning this test can be used) for the duration of the COVID-19 declaration under Section 564(b)(1) of the Act, 21 U.S.C. section 360bbb-3(b)(1), unless the authorization is terminated or revoked sooner.  Performed at [facility], [address], [place], [place] [address]          Radiology Studies: DG Abd 1 View  Result Date: 10/08/2019 CLINICAL DATA:  In-patient, abdominal pain EXAM: ABDOMEN - 1 VIEW COMPARISON:  Thoracic and lumbar spine imaging 10/06/2019  FINDINGS: Paucity of small bowel gas. No high-grade obstructive bowel gas pattern. Air-filled appearance of the colon without large colonic stool burden. Atelectatic changes present in the lung bases. No suspicious calcifications over the urinary tract or abdomen. Cholecystectomy clips in the right upper quadrant. Degenerative changes in the spine hips and pelvis. T12 compression fracture better seen on comparison cross-sectional imaging. No other acute or suspicious osseous abnormalities. Soft tissues are unremarkable. Telemetry leads project over the lower chest and abdomen. IMPRESSION: Air-filled appearance of the colon without large colonic stool burden. No high-grade obstructive bowel gas pattern. Electronically Signed   By: Lovena Le M.D.   On: 10/08/2019 22:29   MR ANGIO HEAD WO CONTRAST  Result Date: 10/08/2019 CLINICAL DATA:  Neuro deficit, acute, stroke suspected. Additional history obtained from [place] reports left arm weakness progressing to leg, unable to lift leg off bed, dysarthric speech, possible mild facial asymmetry. EXAM: MRI HEAD WITHOUT CONTRAST MRA HEAD WITHOUT CONTRAST TECHNIQUE: Multiplanar, multiecho pulse sequences of the brain and surrounding structures were obtained without intravenous contrast. Angiographic images of the head were obtained using MRA technique without contrast. COMPARISON:  Brain MRI 10/05/2019, head CT 10/05/2019 FINDINGS: MRI HEAD FINDINGS Brain: Intermittent motion degradation. Most notably, there is moderate motion degradation of the coronal T2 weighted sequence. There is a 2 mm focus of restricted diffusion within the ventral right medulla consistent with acute/early subacute infarction (series 5, image 57). In retrospect, this was likely present on the prior MRI of 10/05/2019, although poorly demonstrated on the prior study. Stable, moderate generalized parenchymal atrophy. As before, there are chronic lacunar infarcts within the  right corona radiata, basal ganglia, thalami and right middle cerebellar peduncle. Stable background moderate chronic small vessel ischemic changes within the cerebral white matter. No evidence of intracranial mass. No chronic intracranial blood products. No extra-axial fluid collection. No midline shift. Vascular: Reported below. Skull and upper cervical spine: No focal marrow lesion. Sinuses/Orbits: Visualized orbits show no acute finding. Mild ethmoid sinus mucosal thickening. Small left maxillary sinus mucous retention cyst. No significant mastoid effusion. MRA HEAD FINDINGS The intracranial internal carotid arteries. Significant atherosclerotic irregularity of both vessels with multifocal stenoses. Most notably, there is moderate to moderately severe stenosis of the cavernous/paraclinoid right internal carotid artery and up to moderate stenosis of the cavernous left internal carotid artery. There are multiple vascular outpouchings arising from the intracranial internal carotid arteries bilaterally which may reflect aneurysms or vessel lobulation from atherosclerotic disease. This includes a 3 mm inferiorly projecting vascular protrusion arising from the proximal cavernous right ICA (series 1089, image 4), a 1-2 mm inferiorly projecting vascular protrusion arising from the proximal cavernous  left ICA (series 1089, image 17). A 2 mm vascular protrusion arising from the supraclinoid right ICA has an appearance most suggestive of an infundibulum, although a small aneurysm is difficult to definitively exclude. Additional vascular protrusions arising from the paraclinoid internal carotid arteries bilaterally appear to reflect the ophthalmic artery origins (for instance as seen on series 8 9, images 4 and 13). The M1 middle cerebral arteries are patent with sites of mild stenosis bilaterally. Additionally, there is atherosclerotic irregularity of the M2 and more distal MCA branches bilaterally. However, no M2 proximal  branch occlusion or high-grade proximal stenosis is identified. The anterior cerebral arteries are patent. There are moderate/severe focal stenoses at the origins of both A1 segments. The V3 and V4 right vertebral artery is poorly delineated, likely secondary to high-grade stenosis or occlusion. The intracranial left vertebral artery is patent with sites of up to moderate stenosis proximally and distally. 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 Mild atherosclerotic narrowing within the proximal basilar artery. The mid and distal basilar artery is patent without significant stenosis. The posterior cerebral arteries are patent bilaterally with diffuse mild-to-moderate atherosclerotic irregularity. IMPRESSION: MRI brain: 1. Intermittently motion degraded examination. 2. 2 mm acute/early subacute infarct within the ventral right medulla. 3. Redemonstrated chronic small-vessel infarcts within the right corona radiata, bilateral basal ganglia, thalami and right middle cerebellar peduncle. 4. Stable background moderate generalized parenchymal atrophy and chronic small vessel ischemic disease. 5. Mild ethmoid sinus mucosal thickening. 6. Small left maxillary sinus mucous retention cyst. MRA head: 1. Intracranial atherosclerotic disease with multifocal stenoses, as detailed and most notably as follows. 2. The V3 and V4 right vertebral artery is poorly delineated, likely secondary to high-grade stenosis or occlusion. 3. Sites of up to moderate stenosis within the V4 left vertebral artery proximally and distally. 4. Moderate to moderately severe stenosis of the cavernous/paraclinoid right ICA. 5. Moderate stenosis of the cavernous left ICA. 6. Moderate/severe stenoses within the proximal A1 anterior cerebral arteries bilaterally. 7. 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 8. 3 mm vascular protrusion arising from the proximal cavernous right  ICA. 1-2 mm vascular protrusion arising from the proximal cavernous left ICA. These may reflect vessel lobulation from atherosclerotic disease or small aneurysms. 9. 2 mm vascular protrusion arising from the supraclinoid right ICA. This has an appearance most suggestive of an infundibulum, although a small aneurysm at this site is difficult to exclude. Electronically Signed   By: Kellie Simmering DO   On: 10/08/2019 20:43   MR BRAIN WO CONTRAST  Result Date: 10/08/2019 CLINICAL DATA:  Neuro deficit, acute, stroke suspected. Additional history obtained from [place] reports left arm weakness progressing to leg, unable to lift leg off bed, dysarthric speech, possible mild facial asymmetry. EXAM: MRI HEAD WITHOUT CONTRAST MRA HEAD WITHOUT CONTRAST TECHNIQUE: Multiplanar, multiecho pulse sequences of the brain and surrounding structures were obtained without intravenous contrast. Angiographic images of the head were obtained using MRA technique without contrast. COMPARISON:  Brain MRI 10/05/2019, head CT 10/05/2019 FINDINGS: MRI HEAD FINDINGS Brain: Intermittent motion degradation. Most notably, there is moderate motion degradation of the coronal T2 weighted sequence. There is a 2 mm focus of restricted diffusion within the ventral right medulla consistent with acute/early subacute infarction (series 5, image 57). In retrospect, this was likely present on the prior MRI of 10/05/2019, although poorly demonstrated on the prior study. Stable, moderate generalized parenchymal atrophy. As before, there are  chronic lacunar infarcts within the right corona radiata, basal ganglia, thalami and right middle cerebellar peduncle. Stable background moderate chronic small vessel ischemic changes within the cerebral white matter. No evidence of intracranial mass. No chronic intracranial blood products. No extra-axial fluid collection. No midline shift. Vascular: Reported below. Skull and upper cervical spine:  No focal marrow lesion. Sinuses/Orbits: Visualized orbits show no acute finding. Mild ethmoid sinus mucosal thickening. Small left maxillary sinus mucous retention cyst. No significant mastoid effusion. MRA HEAD FINDINGS The intracranial internal carotid arteries. Significant atherosclerotic irregularity of both vessels with multifocal stenoses. Most notably, there is moderate to moderately severe stenosis of the cavernous/paraclinoid right internal carotid artery and up to moderate stenosis of the cavernous left internal carotid artery. There are multiple vascular outpouchings arising from the intracranial internal carotid arteries bilaterally which may reflect aneurysms or vessel lobulation from atherosclerotic disease. This includes a 3 mm inferiorly projecting vascular protrusion arising from the proximal cavernous right ICA (series 1089, image 4), a 1-2 mm inferiorly projecting vascular protrusion arising from the proximal cavernous left ICA (series 1089, image 17). A 2 mm vascular protrusion arising from the supraclinoid right ICA has an appearance most suggestive of an infundibulum, although a small aneurysm is difficult to definitively exclude. Additional vascular protrusions arising from the paraclinoid internal carotid arteries bilaterally appear to reflect the ophthalmic artery origins (for instance as seen on series 8 9, images 4 and 13). The M1 middle cerebral arteries are patent with sites of mild stenosis bilaterally. Additionally, there is atherosclerotic irregularity of the M2 and more distal MCA branches bilaterally. However, no M2 proximal branch occlusion or high-grade proximal stenosis is identified. The anterior cerebral arteries are patent. There are moderate/severe focal stenoses at the origins of both A1 segments. The V3 and V4 right vertebral artery is poorly delineated, likely secondary to high-grade stenosis or occlusion. The intracranial left vertebral artery is patent with sites of up  to moderate stenosis proximally and distally. 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 Mild atherosclerotic narrowing within the proximal basilar artery. The mid and distal basilar artery is patent without significant stenosis. The posterior cerebral arteries are patent bilaterally with diffuse mild-to-moderate atherosclerotic irregularity. IMPRESSION: MRI brain: 1. Intermittently motion degraded examination. 2. 2 mm acute/early subacute infarct within the ventral right medulla. 3. Redemonstrated chronic small-vessel infarcts within the right corona radiata, bilateral basal ganglia, thalami and right middle cerebellar peduncle. 4. Stable background moderate generalized parenchymal atrophy and chronic small vessel ischemic disease. 5. Mild ethmoid sinus mucosal thickening. 6. Small left maxillary sinus mucous retention cyst. MRA head: 1. Intracranial atherosclerotic disease with multifocal stenoses, as detailed and most notably as follows. 2. The V3 and V4 right vertebral artery is poorly delineated, likely secondary to high-grade stenosis or occlusion. 3. Sites of up to moderate stenosis within the V4 left vertebral artery proximally and distally. 4. Moderate to moderately severe stenosis of the cavernous/paraclinoid right ICA. 5. Moderate stenosis of the cavernous left ICA. 6. Moderate/severe stenoses within the proximal A1 anterior cerebral arteries bilaterally. 7. 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 8. 3 mm vascular protrusion arising from the proximal cavernous right ICA. 1-2 mm vascular protrusion arising from the proximal cavernous left ICA. These may reflect vessel lobulation from atherosclerotic disease or small aneurysms. 9. 2 mm vascular protrusion arising from the supraclinoid right ICA. This has an appearance most suggestive of an infundibulum, although a small aneurysm at this  site is difficult to exclude.  Electronically Signed   By: Kellie Simmering DO   On: 10/08/2019 20:43   MR CERVICAL SPINE WO CONTRAST  Result Date: 10/08/2019 CLINICAL DATA:  Left upper and lower extremity EXAM: MRI CERVICAL SPINE WITHOUT CONTRAST TECHNIQUE: Multiplanar, multisequence MR imaging of the cervical spine was performed. No intravenous contrast was administered. COMPARISON:  None. FINDINGS: Alignment: Physiologic. Vertebrae: Mildly heterogeneous marrow signal.  Acute abnormality. Cord: Normal Posterior Fossa, vertebral arteries, paraspinal tissues: Abnormal right vertebral artery flow void. Disc levels: C1-2: Unremarkable. C2-3: Left-greater-than-right facet hypertrophy. Small disc bulge. There is no spinal canal stenosis. No neural foraminal stenosis. C3-4: Intermediate sized disc bulge with bilateral large uncovertebral osteophytes. Mild spinal canal stenosis. Severe bilateral neural foraminal stenosis. C4-5: Right-greater-than-left uncovertebral hypertrophy with intermediate disc bulge. Mild spinal canal stenosis. Severe right and moderate left neural foraminal stenosis. C5-6: Uncovertebral hypertrophy with small disc bulge. There is no spinal canal stenosis. Moderate bilateral neural foraminal stenosis. C6-7: Broad disc bulge with bilateral uncovertebral hypertrophy. There is no spinal canal stenosis. Severe bilateral neural foraminal stenosis. C7-T1: Small disc bulge small bilateral uncovertebral osteophytes. There is no spinal canal stenosis. Severe bilateral neural foraminal stenosis. IMPRESSION: 1. Severe bilateral C4, C7 and C8 neural foraminal stenosis. 2. Mild spinal canal stenosis at C3-4 and C4-5. 3. Moderate bilateral C6 neural foraminal stenosis. Electronically Signed   By: Ulyses Jarred M.D.   On: 10/08/2019 20:20        Scheduled Meds: . amLODipine  5 mg Oral Daily  . aspirin EC  81 mg Oral Daily  . atorvastatin  80 mg Oral Daily  . clopidogrel  75 mg Oral Daily  . enoxaparin (LOVENOX) injection  40 mg  Subcutaneous Q24H  . lactulose  20 g Oral TID  . senna-docusate  2 tablet Oral BID  . sodium chloride flush  3 mL Intravenous Q12H   Continuous Infusions: . sodium chloride    . lactated ringers 75 mL/hr at 10/05/19 2216     LOS: 3 days   Time spent= 35 mins    Melissa Berti Arsenio Loader, MD [facility]  If 7PM-7AM, please contact night-coverage  10/10/2019, 11:53 AM

## 2019-10-10 NOTE — Progress Notes (Signed)
Physical Therapy Treatment Patient Details Name: Melissa Holt MRN: [id number] DOB: 1941/01/04 Today's Date: 10/10/2019    History of Present Illness Pt is a 79 y.o. F with significant PMH of colon cancer who presents with worsening left lower extremity weakness and difficulty with ambulation. 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 MRI thoracic spine showing mild superior endplate fracture of O16 with bone marrow edema consistent with acute fracture. Pt reporting evolving weakness in L UE 10/08/19.    PT Comments    Pt very motivated and eager to get better. Worked on maintaining L knee not locked in hyperextension during both amb and standing. Pt beginning to be able to advance L LE. Pt to continue to benefit from CIR upon d/c for maximal functional recovery as pt was indep PTA.     Follow Up Recommendations  CIR     Equipment Recommendations  3in1 (PT);Wheelchair (measurements PT);Wheelchair cushion (measurements PT)    Recommendations for Other Services Rehab consult     Precautions / Restrictions Precautions Precautions: Fall;Back Precaution Comments: acute spinal fracture - log roll in/out of bed Restrictions Weight Bearing Restrictions: No    Mobility  Bed Mobility Overal bed mobility: Needs Assistance Bed Mobility: Sit to Sidelying Rolling: Min guard       Sit to sidelying: Mod assist;HOB elevated General bed mobility comments: pt up in chair upon arrival, modA for LE management, instructed on how to use R LE to assist L LE  Transfers Overall transfer level: Needs assistance Equipment used: Rolling walker (2 wheeled) Transfers: Sit to/from Stand Sit to Stand: Min assist;+2 physical assistance;+2 safety/equipment         General transfer comment: to power up and steady, cueing for hand placement and erect posture   Ambulation/Gait Ambulation/Gait assistance: Mod assist Gait  Distance (Feet): 15 Feet (x2) Assistive device: Rolling walker (2 wheeled) Gait Pattern/deviations: Step-through pattern;Decreased stride length;Trunk flexed;Narrow base of support Gait velocity: decr Gait velocity interpretation: <1.31 ft/sec, indicative of household ambulator General Gait Details: initiatlly maxA for L LE advancement progressing to minA. Pt with hyperextension of L knee in stance phase, modA to prevent, during 2nd bout of ambulation pt beginning to advance with minA to clear foot   Stairs             Wheelchair Mobility    Modified Rankin (Stroke Patients Only) Modified Rankin (Stroke Patients Only) Pre-Morbid Rankin Score: No significant disability Modified Rankin: Moderately severe disability     Balance Overall balance assessment: Needs assistance Sitting-balance support: No upper extremity supported;Feet supported Sitting balance-Leahy Scale: Fair Sitting balance - Comments: posterior leaning Postural control: Posterior lean Standing balance support: Single extremity supported;Bilateral upper extremity supported;During functional activity Standing balance-Leahy Scale: Poor Standing balance comment: pt stood at sink with OT to do ADls, pt dep on PT to prevent L knee hyperextension, pt with strong R lateral lean onto elbows. PT worked on weight shifting to the L while standing, stood x 5 min                            Cognition Arousal/Alertness: Awake/alert Behavior During Therapy: WFL for tasks assessed/performed Overall Cognitive Status: Impaired/Different from baseline Area of Impairment: Safety/judgement;Problem solving;Following commands;Awareness;Attention                   Current Attention Level: Sustained   Following Commands: Follows one step commands consistently;Follows  one step commands with increased time;Follows multi-step commands inconsistently Safety/Judgement: Decreased awareness of safety;Decreased awareness of  deficits Awareness: Emergent Problem Solving: Requires verbal cues;Slow processing;Difficulty sequencing General Comments: patient with decreased awareness of deficits and safety, requires constant cueing for multistep/dual task processing      Exercises      General Comments        Pertinent Vitals/Pain Pain Assessment: Faces Faces Pain Scale: No hurt Pain Intervention(s): Repositioned;Limited activity within patient's tolerance;Monitored during session    Home Living                      Prior Function            PT Goals (current goals can now be found in the care plan section) Acute Rehab PT Goals Patient Stated Goal: improve strength Progress towards PT goals: Progressing toward goals    Frequency    Min 4X/week      PT Plan Current plan remains appropriate    Co-evaluation PT/OT/SLP Co-Evaluation/Treatment: Yes Reason for Co-Treatment: Complexity of the patient's impairments (multi-system involvement) PT goals addressed during session: Mobility/safety with mobility OT goals addressed during session: ADL's and self-care      AM-PAC PT "6 Clicks" Mobility   Outcome Measure  Help needed turning from your back to your side while in a flat bed without using bedrails?: A Little Help needed moving from lying on your back to sitting on the side of a flat bed without using bedrails?: A Lot Help needed moving to and from a bed to a chair (including a wheelchair)?: A Lot Help needed standing up from a chair using your arms (e.g., wheelchair or bedside chair)?: A Lot Help needed to walk in hospital room?: A Lot Help needed climbing 3-5 steps with a railing? : Total 6 Click Score: 12    End of Session Equipment Utilized During Treatment: Gait belt Activity Tolerance: Patient limited by fatigue Patient left: with call bell/phone within reach;in bed;with bed alarm set Nurse Communication: Mobility status PT Visit Diagnosis: Unsteadiness on feet  (R26.81);Other abnormalities of gait and mobility (R26.89);History of falling (Z91.81);Hemiplegia and hemiparesis Hemiplegia - Right/Left: Left Hemiplegia - dominant/non-dominant: Non-dominant Hemiplegia - caused by: Cerebral infarction     Time: 1032-1100 PT Time Calculation (min) (ACUTE ONLY): 28 min  Charges:  $Gait Training: 8-22 mins                     Kittie Plater, PT, DPT Acute Rehabilitation Services Pager #: [phone] Office #: [phone]    Berline Lopes 10/10/2019, 1:13 PM

## 2019-10-10 NOTE — PMR Pre-admission (Addendum)
PMR Admission Coordinator Pre-Admission Assessment  Patient: Melissa Holt is an 79 y.o., female MRN: [id number] DOB: November 01, 1940 Height: $[id number]'4\' 11"'IXSQESrlSqv$  (149.9 cm) Weight: 68 kg              Insurance Information HMO: yes    PPO:      PCP:      IPA:      80/20:      OTHER:  PRIMARY: [employer] care medicare   Policy#: [id number]      Subscriber: pt CM Name: [name]      Phone#: [phone]     Fax#: [phone] Pre-Cert#: [id number]   Approved for 7 days  Employer:  Benefits:  Phone #: [phone]     Name: 9/1 Eff. Date: 10/10/2019     Deduct: none      Out of Pocket Max: $5900      Life Max: none  CIR: $295 co pay per day days 1 until 5      SNF: no copay days 1 until 20; $184 co pay per day days 21 until 53; no copay days 54 until 100 Outpatient: $20 per visit     Co-Pay: visit limited by medical neccesity Home Health: 100%      Co-Pay: visit limited by medical neccesity DME: 80%     Co-Pay: 205 Providers: in network  SECONDARY: none      Policy#:       Phone#:   Development worker, community:       Phone#:   The Engineer, petroleum" for patients in Inpatient Rehabilitation Facilities with attached "Privacy Act [place] Records" was provided and verbally reviewed with: Patient  Emergency Contact Information Contact Information    Name Relation Home Work [place] Granddaughter [phone]  [place] Daughter [phone]  [phone]   [name]   [phone]     Current Medical History  Patient Admitting Diagnosis: CVA  History of Present Illness:  Melissa Holt is a 79 y.o. female with history of colon cancer, no medical care since colon surgery and has been in relatively good health till fall 1 weeks PTA. She developed bilateral hip pain and then started having difficulty standing due to BLE weakness.  She was found to have to have LLE weakness and transferred to [facility] 10/05/19 for work up and management.  MRI brain  negative for acute changes. MRI lumbar and thoracic spine done revealing acute mild endplate T12 fracture without stenosis and L5-S1 anterolisthesis with facet degeneration and subarticular stenosis. MRI pelvis showed normal lumbosacral plexus with mild bilateral hip OA. 2D echo showed EF 60-65% with moderate LVH and mild MVR. Carotid dopplers without significant stenosis and incidental found to have enlarged heterogenous right thyroid.    She started developing progressive symptoms on 8/30 with LUE weakness and mild facial asymmetry. MRI brain done revealing 2 mm acute/early subacute infarct within ventral right medulla, chronic small vessel infarcts and moderate generalized parenchymal atrophy. 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  MRI C spine showed mild canal stenosis C3/4 and C4/5 and neural foraminal stenosis C4/C7/C8>C6. Therapy ongoing and patient limited by left sided weakness with dysarthria, poor awareness of deficits as well as delayed processing.  Neurosurgery consulted to review spine imaging .  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 In her thoracic spine, there is a mild acute T12 compression fracture.  In her lumbar spine, there is multilevel degenerative disease with moderate foraminal stenosis but no significant neural element impingement.  Dr Maisie Fus saw patient and did not see any structural spine disease that would be responsible for any leg weakness, acute or otherwise. Felt she has lumbar radiculopathic/sciatica symptoms, she can f/u in neurosurgery clinic but nonsurgical therapies and possibly injections would be recommended.  .Complete NIHSS TOTAL: 1    Past Medical History  Past Medical History:  Diagnosis Date  . Cancer [facility])     Family  History  family history includes Lung cancer in her brother.  Prior Rehab/Hospitalizations:  Has the patient had prior rehab or hospitalizations prior to admission? Yes  Has the patient had major surgery during 100 days prior to admission? No  Current Medications   Current Facility-Administered Medications:  .  0.9 %  sodium chloride infusion, 250 mL, Intravenous, PRN, Olga Coaster, MD .  amLODipine (NORVASC) tablet 5 mg, 5 mg, Oral, Daily, Swayze, Ava, DO, 5 mg at 10/12/19 0923 .  aspirin EC tablet 81 mg, 81 mg, Oral, Daily, Olga Coaster, MD, 81 mg at 10/12/19 0923 .  atorvastatin (LIPITOR) tablet 80 mg, 80 mg, Oral, Daily, Erick Blinks, MD, 80 mg at 10/12/19 0924 .  bisacodyl (DULCOLAX) suppository 10 mg, 10 mg, Rectal, Daily PRN, Olga Coaster, MD .  clopidogrel (PLAVIX) tablet 75 mg, 75 mg, Oral, Daily, Micki Riley, MD, 75 mg at 10/12/19 0923 .  enoxaparin (LOVENOX) injection 40 mg, 40 mg, Subcutaneous, Q24H, Olga Coaster, MD, 40 mg at 10/11/19 2050 .  labetalol (NORMODYNE) injection 10 mg, 10 mg, Intravenous, Q2H PRN, Opyd, Lavone Neri, MD .  lactated ringers infusion, , Intravenous, Continuous, Olga Coaster, MD, Last Rate: 75 mL/hr at 10/05/19 2216, New Bag at 10/05/19 2216 .  lactulose (CHRONULAC) 10 GM/15ML solution 20 g, 20 g, Oral, TID, Swayze, Ava, DO, 20 g at 10/10/19 1002 .  LORazepam (ATIVAN) tablet 1 mg, 1 mg, Oral, PRN, Swayze, Ava, DO .  ondansetron (ZOFRAN) tablet 4 mg, 4 mg, Oral, Q6H PRN **OR** ondansetron (ZOFRAN) injection 4 mg, 4 mg, Intravenous, Q6H PRN, Olga Coaster, MD .  polyethylene glycol (MIRALAX / GLYCOLAX) packet 17 g, 17 g, Oral, Daily PRN, Amin, Ankit Chirag, MD, 17 g at 10/12/19 0923 .  predniSONE (DELTASONE) tablet 40 mg, 40 mg, Oral, Q breakfast, Amin, Ankit Chirag, MD, 40 mg at 10/12/19 0923 .  senna-docusate (Senokot-S) tablet 2 tablet, 2 tablet, Oral, BID, Amin, Ankit Chirag, MD, 2 tablet at 10/10/19 1003 .  sodium  chloride flush (NS) 0.9 % injection 3 mL, 3 mL, Intravenous, Q12H, Olga Coaster, MD, 3 mL at 10/11/19 2050 .  sodium chloride flush (NS) 0.9 % injection 3 mL, 3 mL, Intravenous, PRN, Olga Coaster, MD  Patients Current Diet:  Diet Order            Diet regular Room service appropriate? Yes; Fluid consistency: Thin  Diet effective now                 Precautions / Restrictions Precautions Precautions: Fall, Back Precaution Comments: acute spinal fracture - log roll in/out of bed Restrictions Weight Bearing Restrictions: No   Has the patient had 2 or more falls or a fall with injury in the past year?Yes  Prior Activity Level Limited Community (1-2x/wk): Mod I with RW  Prior Functional Level  Prior Function Level of Independence: Needs assistance Gait / Transfers Assistance Needed: Started using walker ~1 week ago ADL's / Homemaking Assistance Needed: typically independent until 1 week PTA, 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  Independent  Indoor Mobility: Did the patient need assistance with walking from room to room (with or without device)? Independent  Stairs: Did the patient need assistance with internal or external stairs (with or without device)? Independent  Functional Cognition: Did the patient need help planning regular tasks such as shopping or remembering to take medications? [place] / [place] Devices/Equipment: Environmental consultant (specify type) (2 wheels) Home Equipment: Walker - 2 wheels  Prior Device Use: Indicate devices/aids used by the patient prior to current illness, exacerbation or injury? Walker  Current Functional Level Cognition  Overall Cognitive Status: Impaired/Different from baseline Current Attention Level: Sustained Orientation Level: Oriented  X4 Following Commands: Follows one step commands consistently, Follows one step commands with increased time, Follows multi-step commands inconsistently Safety/Judgement: Decreased awareness of safety, Decreased awareness of deficits General Comments: patient with decreased awareness of deficits and safety, requires constant cueing for multistep/dual task processing    Extremity Assessment (includes Sensation/Coordination)  Upper Extremity Assessment: LUE deficits/detail LUE Deficits / Details: 3-/5 shoulder, 4-/5 elbow, 3+/5 forearm and gross grasp, 3/5 wrist LUE Coordination: decreased fine motor, decreased gross motor  Lower Extremity Assessment: Defer to PT evaluation RLE Deficits / Details: Strength 5/5 LLE Deficits / Details: Hip flexion 1/5, knee extension 4/5, ankle dorsiflexion 1/5    ADLs  Overall ADL's : Needs assistance/impaired Eating/Feeding: Minimal assistance, Sitting Grooming: Minimal assistance, Wash/dry hands, Oral care, Standing Grooming Details (indicate cue type and reason): min assist to manage items and posture support, PT providing support to L LE  Upper Body Bathing: Moderate assistance, Sitting Lower Body Bathing: Maximal assistance, Sit to/from stand Upper Body Dressing : Moderate assistance, Sitting Lower Body Dressing: Maximal assistance, Sit to/from stand Toilet Transfer: Moderate assistance, +2 for physical assistance, +2 for safety/equipment, Ambulation, RW, Regular Toilet Toilet Transfer Details (indicate cue type and reason): cueing for safety, hand placement and technique; +2 mod assist for ambulation and RW mgmt  Toileting- Clothing Manipulation and Hygiene: Moderate assistance, Sit to/from stand, +2 for physical assistance, +2 for safety/equipment Toileting - Clothing Manipulation Details (indicate cue type and reason): clothing mgmt and hygiene, 2nd person for balance   Functional mobility during ADLs: Moderate assistance, Minimal assistance, +2 for  physical assistance, +2 for safety/equipment, Rolling walker General ADL Comments: pt limited by L sided weakness and impaired coordination, decreased activity tolerance and cognitioin     Mobility  Overal bed mobility: Needs Assistance Bed Mobility: Sit to Sidelying Rolling: Min guard Sidelying to sit: Mod assist, HOB elevated Supine to sit: Mod assist Sit to supine: Mod assist Sit to sidelying: Mod assist, HOB elevated General bed mobility comments: pt up in chair upon arrival, modA for LE management, instructed on how to use R LE to assist L LE    Transfers  Overall transfer level: Needs assistance Equipment used: Rolling walker (2 wheeled) Transfer via Lift Equipment: Stedy Transfers: Sit to/from [employer] to Stand: Min assist, +2 physical assistance, +2 safety/equipment General transfer comment: to power up and steady, cueing for hand placement and erect posture     Ambulation / Gait / Stairs / Wheelchair  Mobility  Ambulation/Gait Ambulation/Gait assistance: Mod assist Gait Distance (Feet): 15 Feet (x2) Assistive device: Rolling walker (2 wheeled) Gait Pattern/deviations: Step-through pattern, Decreased stride length, Trunk flexed, Narrow base of support General Gait Details: initiatlly maxA for L LE advancement progressing to minA. Pt with hyperextension of L knee in stance phase, modA to prevent, during 2nd bout of ambulation pt beginning to advance with minA to clear foot Gait velocity: decr Gait velocity interpretation: <1.31 ft/sec, indicative of household ambulator    Posture / Balance Dynamic Sitting Balance Sitting balance - Comments: posterior leaning Balance Overall balance assessment: Needs assistance Sitting-balance support: No upper extremity supported, Feet supported Sitting balance-Leahy Scale: Fair Sitting balance - Comments: posterior leaning Postural control: Posterior lean Standing balance support: Single extremity supported, Bilateral upper extremity  supported, During functional activity Standing balance-Leahy Scale: Poor Standing balance comment: pt stood at sink with OT to do ADls, pt dep on PT to prevent L knee hyperextension, pt with strong R lateral lean onto elbows. PT worked on weight shifting to the L while standing, stood x 5 min    Special needs/care consideration    Previous Home Environment  Living Arrangements: Alone Available Help at Discharge: Family, Available PRN/intermittently Type of Home: Mobile home Home Layout: One level Home Access: Stairs to enter Entrance Stairs-Rails: Right, Left Entrance Stairs-Number of Steps: 4 Bathroom Shower/Tub: Chiropodist: [place]: No  Discharge Living Setting Plans for Discharge Living Setting: Patient's home, Alone, Mobile Home Type of Home at Discharge: Mobile home Discharge Home Layout: One level Discharge Home Access: Stairs to enter Entrance Stairs-Rails: Right, Left Entrance Stairs-Number of Steps: 4 Discharge Bathroom Shower/Tub: Tub/shower unit Discharge Bathroom Toilet: Standard Discharge Bathroom Accessibility: Yes How Accessible: Accessible via walker Does the patient have any problems obtaining your medications?: No  Social/Family/Support Systems Patient Roles: Parent Contact Information: Granddaughter, Insurance claims handler Anticipated Caregiver: chilren intermitently Anticipated Caregiver's Contact Information: see above Ability/Limitations of Caregiver: family work and come prn Caregiver Availability: Intermittent Discharge Plan Discussed with Primary Caregiver: Yes Is Caregiver In Agreement with Plan?: Yes Does Caregiver/Family have Issues with Lodging/Transportation while Pt is in Rehab?: No  Goals Patient/Family Goal for Rehab: Mod I with PT, OT, and SLP Expected length of stay: ELOS 10 to 14 days Pt/Family Agrees to Admission and willing to participate: Yes Program Orientation Provided & Reviewed with Pt/Caregiver Including  Roles  & Responsibilities: Yes  Decrease burden of Care through IP rehab admission: n/a  Possible need for SNF placement upon discharge:not anticipated  Patient Condition: I have reviewed medical records from [facility], spoken with CM, and patient. I met with patient at the bedside for inpatient rehabilitation assessment.  Patient will benefit from ongoing PT, OT and SLP, can actively participate in 3 hours of therapy a day 5 days of the week, and can make measurable gains during the admission.  Patient will also benefit from the coordinated team approach during an Inpatient Acute Rehabilitation admission.  The patient will receive intensive therapy as well as Rehabilitation physician, nursing, social worker, and care management interventions.  Due to bladder management, bowel management, safety, skin/wound care, disease management, medication administration, pain management and patient education the patient requires 24 hour a day rehabilitation nursing.  The patient is currently min to mod assist overall with mobility and basic ADLs.  Discharge setting and therapy post discharge at home with home health is anticipated.  Patient has agreed to participate in the Acute Inpatient Rehabilitation Program and will  admit today.  Preadmission Screen Completed By:  Cleatrice Burke, RN, 10/12/2019 2:57 PM ______________________________________________________________________   Discussed status with Dr. Ranell Patrick on 10/12/2019 at  1458 and received approval for admission today.  Admission Coordinator:  Cleatrice Burke, time 1848 Date 10/12/2019

## 2019-10-10 NOTE — Progress Notes (Signed)
Occupational Therapy Treatment Patient Details Name: Melissa Holt MRN: [id number] DOB: 1940/06/01 Today's Date: 10/10/2019    History of present illness Pt is a 79 y.o. F with significant PMH of colon cancer who presents with worsening left lower extremity weakness and difficulty with ambulation. 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 MRI thoracic spine showing mild superior endplate fracture of F02 with bone marrow edema consistent with acute fracture. Pt reporting evolving weakness in L UE 10/08/19.   OT comments  Patient seated in recliner and agreeable to OT/PT session.  Patient currently requires min assist +2 for sit to stand transfer, mod assist +2 for functional mobility in room for L LE support, balance, walker mgmt and posture. Cueing throughout session for attention, sequencing, problem solving, and safety.  Completed grooming at sink with min assist standing today, but +2 as PT supporting L LE mgmt; toileting with mod assist, +2 as well with PT supporting L LE.  CIR remains appropriate.  Will follow acutely.    Follow Up Recommendations  CIR    Equipment Recommendations  Other (comment) (TBD at next venue of care )    Recommendations for Other Services      Precautions / Restrictions Precautions Precautions: Fall;Back Precaution Comments: acute spinal fracture - log roll in/out of bed Restrictions Weight Bearing Restrictions: No       Mobility Bed Mobility Overal bed mobility: Needs Assistance Bed Mobility: Rolling;Sit to Sidelying Rolling: Min guard       Sit to sidelying: Mod assist;HOB elevated General bed mobility comments: cueing for technique, assist with BLEs but educated on hooking technique   Transfers Overall transfer level: Needs assistance Equipment used: Rolling walker (2 wheeled) Transfers: Sit to/from Stand Sit to Stand: Min assist;+2 physical assistance;+2  safety/equipment         General transfer comment: to power up and steady, cueing for hand placement and erect posture     Balance Overall balance assessment: Needs assistance Sitting-balance support: No upper extremity supported;Feet supported Sitting balance-Leahy Scale: Fair     Standing balance support: Single extremity supported;Bilateral upper extremity supported;During functional activity Standing balance-Leahy Scale: Poor Standing balance comment: relies on external support                           ADL either performed or assessed with clinical judgement   ADL Overall ADL's : Needs assistance/impaired     Grooming: Minimal assistance;Wash/dry hands;Oral care;Standing Grooming Details (indicate cue type and reason): min assist to manage items and posture support, PT providing support to L LE                  Toilet Transfer: Moderate assistance;+2 for physical assistance;+2 for safety/equipment;Ambulation;RW;Regular Glass blower/designer Details (indicate cue type and reason): cueing for safety, hand placement and technique; +2 mod assist for ambulation and RW mgmt  Toileting- Clothing Manipulation and Hygiene: Moderate assistance;Sit to/from stand;+2 for physical assistance;+2 for safety/equipment Toileting - Clothing Manipulation Details (indicate cue type and reason): clothing mgmt and hygiene, 2nd person for balance       Functional mobility during ADLs: Moderate assistance;Minimal assistance;+2 for physical assistance;+2 for safety/equipment;Rolling walker General ADL Comments: pt limited by L sided weakness and impaired coordination, decreased activity tolerance and cognitioin      Vision       Perception     Praxis      Cognition Arousal/Alertness:  Awake/alert Behavior During Therapy: WFL for tasks assessed/performed Overall Cognitive Status: Impaired/Different from baseline Area of Impairment: Safety/judgement;Problem  solving;Following commands;Awareness;Attention                   Current Attention Level: Sustained   Following Commands: Follows one step commands consistently;Follows one step commands with increased time;Follows multi-step commands inconsistently Safety/Judgement: Decreased awareness of safety;Decreased awareness of deficits Awareness: Emergent Problem Solving: Requires verbal cues;Slow processing;Difficulty sequencing General Comments: patient with decreased awareness of deficits and safety, requires constant cueing for multistep/dual task processing        Exercises     Shoulder Instructions       General Comments      Pertinent Vitals/ Pain       Pain Assessment: Faces Faces Pain Scale: No hurt Pain Intervention(s): Repositioned;Limited activity within patient's tolerance;Monitored during session  Home Living                                          Prior Functioning/Environment              Frequency  Min 3X/week        Progress Toward Goals  OT Goals(current goals can now be found in the care plan section)  Progress towards OT goals: Progressing toward goals  Acute Rehab OT Goals Patient Stated Goal: improve strength OT Goal Formulation: With patient  Plan Discharge plan remains appropriate;Frequency remains appropriate    Co-evaluation    PT/OT/SLP Co-Evaluation/Treatment: Yes Reason for Co-Treatment: For patient/therapist safety;To address functional/ADL transfers   OT goals addressed during session: ADL's and self-care      AM-PAC OT "6 Clicks" Daily Activity     Outcome Measure   Help from another person eating meals?: A Little Help from another person taking care of personal grooming?: A Little Help from another person toileting, which includes using toliet, bedpan, or urinal?: A Lot Help from another person bathing (including washing, rinsing, drying)?: A Lot Help from another person to put on and taking off  regular upper body clothing?: A Lot Help from another person to put on and taking off regular lower body clothing?: Total 6 Click Score: 13    End of Session Equipment Utilized During Treatment: Gait belt;Rolling walker  OT Visit Diagnosis: Unsteadiness on feet (R26.81);Other abnormalities of gait and mobility (R26.89);Hemiplegia and hemiparesis;Muscle weakness (generalized) (M62.81);History of falling (Z91.81);Other symptoms and signs involving cognitive function Hemiplegia - Right/Left: Left Hemiplegia - dominant/non-dominant: Non-Dominant   Activity Tolerance Patient tolerated treatment well   Patient Left in bed;with call bell/phone within reach;with bed alarm set;with nursing/sitter in room   Nurse Communication Mobility status        Time: 1030-1058 OT Time Calculation (min): 28 min  Charges: OT General Charges $OT Visit: 1 Visit OT Treatments $Self Care/Home Management : 8-22 mins  Jolaine Artist, OT Acute Rehabilitation Services Pager [phone] Office [phone]    Delight Stare 10/10/2019, 12:46 PM

## 2019-10-10 NOTE — Progress Notes (Signed)
Inpatient Rehabilitation Admissions Coordinator  I will begin insurance authorization with [facility] today for possible admit pending their approval.  Danne Baxter, RN, MSN Rehab Admissions Coordinator [phone] 10/10/2019 12:49 PM

## 2019-10-11 LAB — BASIC METABOLIC PANEL
Anion gap: 11 (ref 5–15)
BUN: 13 mg/dL (ref 8–23)
CO2: 22 mmol/L (ref 22–32)
Calcium: 10.1 mg/dL (ref 8.9–10.3)
Chloride: 104 mmol/L (ref 98–111)
Creatinine, Ser: 0.87 mg/dL (ref 0.44–1.00)
GFR calc Af Amer: >60 mL/min (ref >60)
GFR calc non Af Amer: >60 mL/min (ref >60)
Glucose, Bld: 103 mg/dL — ABNORMAL HIGH (ref 70–99)
Potassium: 3.9 mmol/L (ref 3.5–5.1)
Sodium: 137 mmol/L (ref 135–145)

## 2019-10-11 LAB — D-DIMER, QUANTITATIVE: D-Dimer, Quant: 0.99 ug/mL-FEU — ABNORMAL HIGH (ref 0.00–0.50)

## 2019-10-11 LAB — MAGNESIUM: Magnesium: 1.9 mg/dL (ref 1.7–2.4)

## 2019-10-11 MED ORDER — PREDNISONE 20 MG PO TABS
40.0000 mg | ORAL_TABLET | Freq: Every day | ORAL | Status: DC
  Administered 2019-10-11 – 2019-10-12 (×2): 40 mg via ORAL
  Filled 2019-10-11 (×2): qty 2

## 2019-10-11 NOTE — Progress Notes (Signed)
PROGRESS NOTE    Melissa Holt  UKG:[id number] DOB: [birth year] DOA: 10/05/2019 PCP: Patient, No Pcp Per   Brief Narrative:  79 year old with history of ambulatory dysfunction, colon cancer, dementia, essential hypertension initially presented to the hospital for left lower extremity weakness 4 days prior to admission but intermittent falls at home 2 weeks prior to that.  Work-up in the ER was negative for CVA including CT head and MRI brain.  Telemetry neuro suspected lumbar pathology therefore MRI lumbar spine and CT lumbar and thoracic spine was ordered.  This demonstrated lumbar sacral stenosis, mild superior endplate fracture J62 with marrow edema.  Echocardiogram showed EF 60 to 65%, carotid Dopplers did not show stenosis but large slightly abnormal appearing thyroid.  LDL 186.  Due to change in mental status, repeat MRI was done by neurology which demonstrated small acute/subacute CVA with chronic lacunar infarct.  MRA showed high-grade V3-V4 stenosis, moderate stenosis of the right and left ICA.  MRI C-spine showed foraminal stenosis.   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 -Seen by neurology team -PT/OT recommending CIR.  Abnormal appearance of thyroid/goiter -No focal nodules.  Follow-up outpatient with PCP. -TSH is within normal limits.  Mild Acute T12 Compression Fracture  Multiple Area of Forminal Stenosis, Lumbar and Sacral -Spoke with Neurosugery who reviewed her Scan, recommends Outpatient follow up. For now will do 5 day course of Prednisone 40mg  po daily.   Constipation with large stool burden; Improved.  -Out of bed to chair, daily bowel regimen.   Essential hypertension -On  Norvasc  Hyperlipidemia -Uncontrolled, Lipitor 40 mg daily  Prolonged QTC -Resolved  DVT prophylaxis: Lovenox Code Status: Full code Family Communication: Spoke with Her daughter [name]  Status is: Inpatient  Remains inpatient appropriate because:IV treatments appropriate due to intensity of illness or inability to take PO   Dispo: The patient is from: Home              Anticipated d/c is to: CIR              Anticipated d/c date is: 1 day              Patient currently is medically stable to d/c. Patient is medically stable to be transitioned to CIR when bed is available.       Body mass index is 29.83 kg/m.        Subjective: Still having left lower extremity weakness but no other new complaints at this time.  Review of Systems Otherwise negative except as per HPI, including: General: Denies fever, chills, night sweats or unintended weight loss. Resp: Denies cough, wheezing, shortness of breath. Cardiac: Denies chest pain, palpitations, orthopnea, paroxysmal nocturnal dyspnea. GI: Denies abdominal pain, nausea, vomiting, diarrhea or constipation GU: Denies dysuria, frequency, hesitancy or incontinence MS: Denies muscle aches, joint pain or swelling Neuro: Denies headache Psych: Denies anxiety, depression, SI/HI/AVH Skin: Denies new rashes or lesions ID: Denies sick contacts, exotic exposures, travel  Examination:  Constitutional: Not in acute distress Respiratory: Clear to auscultation bilaterally Cardiovascular: Normal sinus rhythm, no rubs Abdomen: Nontender nondistended good bowel sounds Musculoskeletal: Left lower extremity strength 3/5, right lower extremity strength 4/5 Skin: No rashes seen Neurologic: CN 2-12  grossly intact.  And nonfocal Psychiatric: Normal judgment and insight. Alert and oriented x 3. Normal mood.  Objective: Vitals:   10/10/19 2305 10/11/19 0342 10/11/19 0500 10/11/19 0726  BP: (!) 142/73 126/72  114/63  Pulse: 73 71   77  Resp: 17 18  18   Temp: (!) 97.5 F (36.4 C) 98.1 F (36.7 C)  98.6 F (37 C)  TempSrc: Oral Oral  Oral  SpO2: 97% 97%  100%  Weight:   67 kg   Height:        Intake/Output Summary (Last 24 hours) at 10/11/2019 1051 Last data filed at 10/11/2019 0855 Gross per 24 hour  Intake 440 ml  Output --  Net 440 ml   Filed Weights   10/08/19 0438 10/10/19 0500 10/11/19 0500  Weight: 67.6 kg 67.4 kg 67 kg     Data Reviewed:   CBC: Recent Labs  Lab 10/05/19 1455 10/08/19 0114  WBC 8.4 6.9  NEUTROABS 4.5 3.5  HGB 13.4 13.1  HCT 42.2 40.0  MCV 97.5 96.2  PLT 189 751   Basic Metabolic Panel: Recent Labs  Lab 10/05/19 1455 10/08/19 0114 10/11/19 0230  NA 137 138 137  K 4.0 3.4* 3.9  CL 104 105 104  CO2 22 22 22   GLUCOSE 95 101* 103*  BUN 14 8 13   CREATININE 0.93 0.70 0.87  CALCIUM 9.5 9.4 10.1  MG  --   --  1.9   GFR: Estimated Creatinine Clearance: 44.3 mL/min (by C-G formula based on SCr of 0.87 mg/dL). Liver Function Tests: Recent Labs  Lab 10/05/19 1455  AST 21  ALT 16  ALKPHOS 67  BILITOT 0.5  PROT 7.7  ALBUMIN 4.1   No results for input(s): LIPASE, AMYLASE in the last 168 hours. No results for input(s): AMMONIA in the last 168 hours. Coagulation Profile: No results for input(s): INR, PROTIME in the last 168 hours. Cardiac Enzymes: No results for input(s): CKTOTAL, CKMB, CKMBINDEX, TROPONINI in the last 168 hours. BNP (last 3 results) No results for input(s): PROBNP in the last 8760 hours. HbA1C: Recent Labs    10/09/19 0508  HGBA1C 5.8*   CBG: No results for input(s): GLUCAP in the last 168 hours. Lipid Profile: No results for input(s): CHOL, HDL, LDLCALC, TRIG, CHOLHDL, LDLDIRECT in the last 72 hours. Thyroid Function Tests: Recent Labs    10/10/19 0508  TSH 3.576   Anemia Panel: No results for input(s): VITAMINB12, FOLATE, FERRITIN, TIBC, IRON, RETICCTPCT in the last 72 hours. Sepsis Labs: No results for input(s): PROCALCITON,  LATICACIDVEN in the last 168 hours.  Recent Results (from the past 240 hour(s))  SARS Coronavirus 2 by RT PCR (hospital order, performed in [facility] hospital lab) Nasopharyngeal Nasopharyngeal Swab     Status: None   Collection Time: 10/05/19  8:30 PM   Specimen: Nasopharyngeal Swab  Result Value Ref Range Status   SARS Coronavirus 2 NEGATIVE NEGATIVE Final    Comment: (NOTE) SARS-CoV-2 target nucleic acids are NOT DETECTED.  The SARS-CoV-2 RNA is generally detectable in upper and lower respiratory specimens during the acute phase of infection. The lowest concentration of SARS-CoV-2 viral copies this assay can detect is 250 copies / mL. A negative result does not preclude SARS-CoV-2 infection and should not be used as the sole basis for treatment or other patient management decisions.  A negative result may occur with improper specimen collection / handling, submission of specimen other than nasopharyngeal swab, presence of viral mutation(s) within the areas  targeted by this assay, and inadequate number of viral copies (<250 copies / mL). A negative result must be combined with clinical observations, patient history, and epidemiological information.  Fact Sheet for Patients:   StrictlyIdeas.no  Fact Sheet for Healthcare Providers: BankingDealers.co.za  This test is not yet approved or  cleared by the [place] FDA and has been authorized for detection and/or diagnosis of SARS-CoV-2 by FDA under an Emergency Use Authorization (EUA).  This EUA will remain in effect (meaning this test can be used) for the duration of the COVID-19 declaration under Section 564(b)(1) of the Act, 21 U.S.C. section 360bbb-3(b)(1), unless the authorization is terminated or revoked sooner.  Performed at [facility], [address] [place], [place] [address]          Radiology Studies: No results found.      Scheduled Meds:  amLODipine   5 mg Oral Daily   aspirin EC  81 mg Oral Daily   atorvastatin  80 mg Oral Daily   clopidogrel  75 mg Oral Daily   enoxaparin (LOVENOX) injection  40 mg Subcutaneous Q24H   lactulose  20 g Oral TID   predniSONE  40 mg Oral Q breakfast   senna-docusate  2 tablet Oral BID   sodium chloride flush  3 mL Intravenous Q12H   Continuous Infusions:  sodium chloride     lactated ringers 75 mL/hr at 10/05/19 2216     LOS: 4 days   Time spent= 35 mins    Anum Palecek Arsenio Loader, MD [facility]  If 7PM-7AM, please contact night-coverage  10/11/2019, 10:51 AM

## 2019-10-11 NOTE — Progress Notes (Signed)
Neurosurger  Asked by Dr. Reesa Chew to review spine imaging for this patient.  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 In her thoracic spine, there is a mild acute T12 compression fracture.  In her lumbar spine, there is multilevel degenerative disease with moderate foraminal stenosis but no significant neural element impingement.  I do not see any structural spine disease that would be responsible for any leg weakness, acute or otherwise.  If she has lumbar radiculopathic/sciatica symptoms, she can f/u in neurosurgery clinic but nonsurgical therapies and possibly injections would be recommended.

## 2019-10-12 ENCOUNTER — Inpatient Hospital Stay (HOSPITAL_COMMUNITY)
Admission: RE | Admit: 2019-10-12 | Discharge: 2019-10-26 | DRG: 057 | Disposition: A | Discharge status: Home Health | Payer: Medicare Other | Source: Intra-hospital | Attending: Physical Medicine & Rehabilitation | Admitting: Physical Medicine & Rehabilitation

## 2019-10-12 ENCOUNTER — Encounter (HOSPITAL_COMMUNITY): Payer: Self-pay | Admitting: Physical Medicine & Rehabilitation

## 2019-10-12 ENCOUNTER — Other Ambulatory Visit: Payer: Self-pay

## 2019-10-12 DIAGNOSIS — M160 Bilateral primary osteoarthritis of hip: Secondary | ICD-10-CM | POA: Diagnosis present

## 2019-10-12 DIAGNOSIS — Z87891 Personal history of nicotine dependence: Secondary | ICD-10-CM

## 2019-10-12 DIAGNOSIS — Z7902 Long term (current) use of antithrombotics/antiplatelets: Secondary | ICD-10-CM | POA: Diagnosis not present

## 2019-10-12 DIAGNOSIS — R7303 Prediabetes: Secondary | ICD-10-CM | POA: Diagnosis present

## 2019-10-12 DIAGNOSIS — D72829 Elevated white blood cell count, unspecified: Secondary | ICD-10-CM | POA: Diagnosis present

## 2019-10-12 DIAGNOSIS — M79672 Pain in left foot: Secondary | ICD-10-CM

## 2019-10-12 DIAGNOSIS — I6612 Occlusion and stenosis of left anterior cerebral artery: Secondary | ICD-10-CM | POA: Diagnosis not present

## 2019-10-12 DIAGNOSIS — K219 Gastro-esophageal reflux disease without esophagitis: Secondary | ICD-10-CM

## 2019-10-12 DIAGNOSIS — Z801 Family history of malignant neoplasm of trachea, bronchus and lung: Secondary | ICD-10-CM

## 2019-10-12 DIAGNOSIS — M7662 Achilles tendinitis, left leg: Secondary | ICD-10-CM | POA: Diagnosis not present

## 2019-10-12 DIAGNOSIS — D62 Acute posthemorrhagic anemia: Secondary | ICD-10-CM | POA: Diagnosis not present

## 2019-10-12 DIAGNOSIS — I69319 Unspecified symptoms and signs involving cognitive functions following cerebral infarction: Secondary | ICD-10-CM

## 2019-10-12 DIAGNOSIS — M5136 Other intervertebral disc degeneration, lumbar region: Secondary | ICD-10-CM | POA: Diagnosis present

## 2019-10-12 DIAGNOSIS — S22089D Unspecified fracture of T11-T12 vertebra, subsequent encounter for fracture with routine healing: Secondary | ICD-10-CM

## 2019-10-12 DIAGNOSIS — I69391 Dysphagia following cerebral infarction: Secondary | ICD-10-CM | POA: Diagnosis not present

## 2019-10-12 DIAGNOSIS — I10 Essential (primary) hypertension: Secondary | ICD-10-CM | POA: Diagnosis present

## 2019-10-12 DIAGNOSIS — K5900 Constipation, unspecified: Secondary | ICD-10-CM | POA: Diagnosis present

## 2019-10-12 DIAGNOSIS — Z7982 Long term (current) use of aspirin: Secondary | ICD-10-CM

## 2019-10-12 DIAGNOSIS — I639 Cerebral infarction, unspecified: Secondary | ICD-10-CM | POA: Diagnosis present

## 2019-10-12 DIAGNOSIS — R7309 Other abnormal glucose: Secondary | ICD-10-CM | POA: Diagnosis not present

## 2019-10-12 DIAGNOSIS — I6350 Cerebral infarction due to unspecified occlusion or stenosis of unspecified cerebral artery: Secondary | ICD-10-CM | POA: Diagnosis not present

## 2019-10-12 DIAGNOSIS — T380X5A Adverse effect of glucocorticoids and synthetic analogues, initial encounter: Secondary | ICD-10-CM | POA: Diagnosis present

## 2019-10-12 DIAGNOSIS — Z79899 Other long term (current) drug therapy: Secondary | ICD-10-CM | POA: Diagnosis not present

## 2019-10-12 DIAGNOSIS — E049 Nontoxic goiter, unspecified: Secondary | ICD-10-CM

## 2019-10-12 DIAGNOSIS — R739 Hyperglycemia, unspecified: Secondary | ICD-10-CM

## 2019-10-12 DIAGNOSIS — R1310 Dysphagia, unspecified: Secondary | ICD-10-CM | POA: Diagnosis present

## 2019-10-12 DIAGNOSIS — K5901 Slow transit constipation: Secondary | ICD-10-CM

## 2019-10-12 DIAGNOSIS — I69354 Hemiplegia and hemiparesis following cerebral infarction affecting left non-dominant side: Principal | ICD-10-CM

## 2019-10-12 DIAGNOSIS — I63521 Cerebral infarction due to unspecified occlusion or stenosis of right anterior cerebral artery: Secondary | ICD-10-CM | POA: Diagnosis not present

## 2019-10-12 DIAGNOSIS — M79671 Pain in right foot: Secondary | ICD-10-CM

## 2019-10-12 DIAGNOSIS — I6389 Other cerebral infarction: Secondary | ICD-10-CM | POA: Diagnosis not present

## 2019-10-12 LAB — BASIC METABOLIC PANEL
Anion gap: 13 (ref 5–15)
BUN: 24 mg/dL — ABNORMAL HIGH (ref 8–23)
CO2: 19 mmol/L — ABNORMAL LOW (ref 22–32)
Calcium: 9.9 mg/dL (ref 8.9–10.3)
Chloride: 104 mmol/L (ref 98–111)
Creatinine, Ser: 1.1 mg/dL — ABNORMAL HIGH (ref 0.44–1.00)
GFR calc Af Amer: 56 mL/min — ABNORMAL LOW (ref >60)
GFR calc non Af Amer: 48 mL/min — ABNORMAL LOW (ref >60)
Glucose, Bld: 133 mg/dL — ABNORMAL HIGH (ref 70–99)
Potassium: 3.8 mmol/L (ref 3.5–5.1)
Sodium: 136 mmol/L (ref 135–145)

## 2019-10-12 LAB — MAGNESIUM: Magnesium: 1.8 mg/dL (ref 1.7–2.4)

## 2019-10-12 LAB — D-DIMER, QUANTITATIVE: D-Dimer, Quant: 1.03 ug/mL-FEU — ABNORMAL HIGH (ref 0.00–0.50)

## 2019-10-12 LAB — GLUCOSE, CAPILLARY: Glucose-Capillary: 166 mg/dL — ABNORMAL HIGH (ref 70–99)

## 2019-10-12 MED ORDER — CLOPIDOGREL BISULFATE 75 MG PO TABS
75.0000 mg | ORAL_TABLET | Freq: Every day | ORAL | Status: DC
  Administered 2019-10-13 – 2019-10-26 (×14): 75 mg via ORAL
  Filled 2019-10-12 (×14): qty 1

## 2019-10-12 MED ORDER — CLOPIDOGREL BISULFATE 75 MG PO TABS: 75.0000 mg | ORAL_TABLET | Freq: Every day | ORAL | Status: DC

## 2019-10-12 MED ORDER — PROCHLORPERAZINE EDISYLATE 10 MG/2ML IJ SOLN: 5.0000 mg | Freq: Four times a day (QID) | INTRAMUSCULAR | Status: DC | PRN

## 2019-10-12 MED ORDER — AMLODIPINE BESYLATE 5 MG PO TABS
5.0000 mg | ORAL_TABLET | Freq: Every day | ORAL | Status: DC
  Administered 2019-10-13 – 2019-10-25 (×13): 5 mg via ORAL
  Filled 2019-10-12 (×13): qty 1

## 2019-10-12 MED ORDER — DIPHENHYDRAMINE HCL 12.5 MG/5ML PO ELIX: 12.5000 mg | ORAL_SOLUTION | Freq: Four times a day (QID) | ORAL | Status: DC | PRN

## 2019-10-12 MED ORDER — LACTULOSE 10 GM/15ML PO SOLN: 20.0000 g | Freq: Two times a day (BID) | ORAL | 0 refills | Status: DC | PRN

## 2019-10-12 MED ORDER — SENNOSIDES-DOCUSATE SODIUM 8.6-50 MG PO TABS: 2.0000 | ORAL_TABLET | Freq: Every day | ORAL | Status: DC

## 2019-10-12 MED ORDER — ASPIRIN EC 81 MG PO TBEC
81.0000 mg | DELAYED_RELEASE_TABLET | Freq: Every day | ORAL | Status: DC
  Administered 2019-10-13 – 2019-10-26 (×14): 81 mg via ORAL
  Filled 2019-10-12 (×14): qty 1

## 2019-10-12 MED ORDER — PROCHLORPERAZINE MALEATE 5 MG PO TABS: 5.0000 mg | ORAL_TABLET | Freq: Four times a day (QID) | ORAL | Status: DC | PRN

## 2019-10-12 MED ORDER — ASPIRIN 81 MG PO TBEC: 81.0000 mg | DELAYED_RELEASE_TABLET | Freq: Every day | ORAL | 0 refills | Status: DC

## 2019-10-12 MED ORDER — POLYETHYLENE GLYCOL 3350 17 G PO PACK: 17.0000 g | PACK | Freq: Every day | ORAL | Status: DC | PRN

## 2019-10-12 MED ORDER — ATORVASTATIN CALCIUM 80 MG PO TABS
80.0000 mg | ORAL_TABLET | Freq: Every day | ORAL | Status: DC
Start: 2019-10-12 — End: 2019-10-26

## 2019-10-12 MED ORDER — POLYETHYLENE GLYCOL 3350 17 G PO PACK
17.0000 g | PACK | Freq: Every day | ORAL | Status: DC
  Administered 2019-10-13 – 2019-10-23 (×3): 17 g via ORAL
  Filled 2019-10-12 (×8): qty 1

## 2019-10-12 MED ORDER — ACETAMINOPHEN 325 MG PO TABS
325.0000 mg | ORAL_TABLET | ORAL | Status: DC | PRN
  Administered 2019-10-14 – 2019-10-26 (×3): 650 mg via ORAL
  Filled 2019-10-12 (×4): qty 2

## 2019-10-12 MED ORDER — FLEET ENEMA 7-19 GM/118ML RE ENEM: 1.0000 | ENEMA | Freq: Once | RECTAL | Status: DC | PRN

## 2019-10-12 MED ORDER — PROCHLORPERAZINE 25 MG RE SUPP: 12.5000 mg | Freq: Four times a day (QID) | RECTAL | Status: DC | PRN

## 2019-10-12 MED ORDER — BISACODYL 10 MG RE SUPP: 10.0000 mg | Freq: Every day | RECTAL | 0 refills | Status: DC | PRN

## 2019-10-12 MED ORDER — BISACODYL 10 MG RE SUPP: 10.0000 mg | Freq: Every day | RECTAL | Status: DC | PRN

## 2019-10-12 MED ORDER — ATORVASTATIN CALCIUM 80 MG PO TABS
80.0000 mg | ORAL_TABLET | Freq: Every day | ORAL | Status: DC
  Administered 2019-10-13 – 2019-10-26 (×14): 80 mg via ORAL
  Filled 2019-10-12 (×14): qty 1

## 2019-10-12 MED ORDER — PREDNISONE 20 MG PO TABS
40.0000 mg | ORAL_TABLET | Freq: Every day | ORAL | Status: AC
  Administered 2019-10-13 – 2019-10-15 (×3): 40 mg via ORAL
  Filled 2019-10-12 (×3): qty 2

## 2019-10-12 MED ORDER — ENOXAPARIN SODIUM 40 MG/0.4ML ~~LOC~~ SOLN
40.0000 mg | SUBCUTANEOUS | Status: DC
  Administered 2019-10-13 – 2019-10-25 (×12): 40 mg via SUBCUTANEOUS
  Filled 2019-10-12 (×13): qty 0.4

## 2019-10-12 MED ORDER — AMLODIPINE BESYLATE 5 MG PO TABS: 5.0000 mg | ORAL_TABLET | Freq: Every day | ORAL | Status: DC

## 2019-10-12 MED ORDER — PREDNISONE 20 MG PO TABS
40.0000 mg | ORAL_TABLET | Freq: Every day | ORAL | 0 refills | Status: DC
Start: 2019-10-12 — End: 2019-10-26

## 2019-10-12 MED ORDER — POLYETHYLENE GLYCOL 3350 17 G PO PACK: 17.0000 g | PACK | Freq: Every day | ORAL | 0 refills | Status: DC | PRN

## 2019-10-12 MED ORDER — ALUM & MAG HYDROXIDE-SIMETH 200-200-20 MG/5ML PO SUSP: 30.0000 mL | ORAL | Status: DC | PRN

## 2019-10-12 MED ORDER — GUAIFENESIN-DM 100-10 MG/5ML PO SYRP
5.0000 mL | ORAL_SOLUTION | Freq: Four times a day (QID) | ORAL | Status: DC | PRN
  Administered 2019-10-19 – 2019-10-26 (×5): 10 mL via ORAL
  Filled 2019-10-12 (×5): qty 10

## 2019-10-12 MED ORDER — TRAZODONE HCL 50 MG PO TABS: 25.0000 mg | ORAL_TABLET | Freq: Every evening | ORAL | Status: DC | PRN

## 2019-10-12 NOTE — H&P (Addendum)
Physical Medicine and Rehabilitation Admission H&P    Chief Complaint  Patient presents with  .     HPI:  Melissa [place] is a 12 female with history of colon cancer--no medical care since colon surgery and had been in relatively good health till fall 1 week prior to admission. She developed bilateral hip pain then started having difficulty standing due to BLE weakness. She was found to have LLE weakness and was transferred to [facility] on 10/05/2019 for work-up and management. MRI brain was negative for acute changes. MRI lumbar and thoracic spine revealed acute mild endplate T12 fracture without stenosis and L5-S1 anterolisthesis with subarticular stenosis. MRI pelvis showed normal lumbosacral plexus with mild bilateral hip OA. 2D echo showed EF of 60 to 65% with moderate LVH and mild MVR. Carotid Dopplers were negative for ICA stenosis and showed incidental line large heterogeneous right thyroid.  She started developing progressive symptoms on 08/30 with LUE weakness and mild facial asymmetry. MRI brain done revealing 2 mm acute/early subacute infarct within right ventral medulla, chronic small vessel infarcts and moderate generalized parenchymal atrophy. 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 MRI C-spine showed mild canal stenosis C3-C4 and C4-C5 and neural foraminal stenosis. Dr. Leonie Man felt that stroke secondary to small vessel disease with resultant LLE weakness and recommended DAPT x3 weeks followed by Plavix alone. Outpatient follow-up recommended for small cerebral aneurysms. Dr. Marcello Moores evaluated spine films and felt that no structural spine disease responsible for leg weakness and to follow-up on outpatient basis for nonsurgical therapies. 5-day course of prednisone recommended by NS to help manage symptoms. She  continues to have deficits with mobility and ADL tasks. CIR recommended due to functional decline   Review of Systems  Constitutional: Negative for chills and fever.  HENT: Negative for hearing loss and tinnitus.   Eyes: Negative for blurred vision and double vision.  Respiratory: Negative for cough and hemoptysis.   Cardiovascular: Negative for chest pain and palpitations.  Gastrointestinal: Negative for heartburn and nausea.  Genitourinary: Negative for urgency.  Musculoskeletal: Positive for back pain. Negative for myalgias.  Skin: Negative for rash.  Neurological: Negative for dizziness and headaches.  Endo/Heme/Allergies: Negative for environmental allergies. Does not bruise/bleed easily.  Psychiatric/Behavioral: Negative for depression and suicidal ideas.      Past Medical History:  Diagnosis Date  . Cancer [facility])      Past Surgical History:  Procedure Laterality Date  . COLON SURGERY      Family History  Problem Relation Age of Onset  . Lung cancer Brother     Social History:  reports that she has never smoked. Her smokeless tobacco use includes snuff. She reports current alcohol use. She reports that she does not use drugs.    Allergies: No Known Allergies    Medications Prior to Admission  Medication Sig Dispense Refill  . amLODipine (NORVASC) 5 MG tablet Take 1 tablet (5 mg total) by mouth daily.    Marland Kitchen aspirin EC 81 MG EC tablet Take 1 tablet (81 mg total) by mouth daily for 21 days. Swallow whole. 21 tablet 0  . atorvastatin (LIPITOR) 80 MG tablet Take 1 tablet (80 mg total) by mouth daily.    . bisacodyl (DULCOLAX) 10 MG suppository Place 1 suppository (10 mg total) rectally daily as needed for severe constipation. 12 suppository  0  . clopidogrel (PLAVIX) 75 MG tablet Take 1 tablet (75 mg total) by mouth daily.    Marland Kitchen lactulose (CHRONULAC) 10 GM/15ML solution Take 30 mLs (20 g total) by mouth 2 (two) times daily as needed for moderate constipation. 236 mL 0  .  polyethylene glycol (MIRALAX / GLYCOLAX) 17 g packet Take 17 g by mouth daily as needed for moderate constipation or severe constipation. 14 each 0  . predniSONE (DELTASONE) 20 MG tablet Take 2 tablets (40 mg total) by mouth daily with breakfast for 4 days. 8 tablet 0  . senna-docusate (SENOKOT-S) 8.6-50 MG tablet Take 2 tablets by mouth at bedtime.      Drug Regimen Review  Drug regimen was reviewed and remains appropriate with no significant issues identified  Home: Home Living Family/patient expects to be discharged to:: Private residence Living Arrangements: Alone Available Help at Discharge: Family, Available PRN/intermittently Type of Home: Mobile home Home Access: Stairs to enter Entrance Stairs-Number of Steps: 4 Entrance Stairs-Rails: Right, Left Home Layout: One level Bathroom Shower/Tub: Chiropodist: Standard Home Equipment: Environmental consultant - 2 wheels   Functional History: Prior Function Level of Independence: Needs assistance Gait / Transfers Assistance Needed: Started using walker ~1 week ago ADL's / Homemaking Assistance Needed: typically independent until 1 week PTA, 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 Comments: weakness in LEs with buckling over last several weeks  Functional Status:  Mobility: Bed Mobility Overal bed mobility: Needs Assistance Bed Mobility: Sit to Sidelying Rolling: Min guard Sidelying to sit: Mod assist, HOB elevated Supine to sit: Mod assist Sit to supine: Mod assist Sit to sidelying: Mod assist, HOB elevated General bed mobility comments: pt up in chair upon arrival, modA for LE management, instructed on how to use R LE to assist L LE Transfers Overall transfer level: Needs assistance Equipment used: Rolling walker (2 wheeled) Transfer via Lift Equipment: Stedy Transfers: Sit to/from [employer] to Stand: Min assist, +2 physical assistance, +2 safety/equipment General transfer comment: to power up and steady, cueing  for hand placement and erect posture  Ambulation/Gait Ambulation/Gait assistance: Mod assist Gait Distance (Feet): 15 Feet (x2) Assistive device: Rolling walker (2 wheeled) Gait Pattern/deviations: Step-through pattern, Decreased stride length, Trunk flexed, Narrow base of support General Gait Details: initiatlly maxA for L LE advancement progressing to minA. Pt with hyperextension of L knee in stance phase, modA to prevent, during 2nd bout of ambulation pt beginning to advance with minA to clear foot Gait velocity: decr Gait velocity interpretation: <1.31 ft/sec, indicative of household ambulator  ADL: ADL Overall ADL's : Needs assistance/impaired Eating/Feeding: Minimal assistance, Sitting Grooming: Minimal assistance, Wash/dry hands, Oral care, Standing Grooming Details (indicate cue type and reason): min assist to manage items and posture support, PT providing support to L LE  Upper Body Bathing: Moderate assistance, Sitting Lower Body Bathing: Maximal assistance, Sit to/from stand Upper Body Dressing : Moderate assistance, Sitting Lower Body Dressing: Maximal assistance, Sit to/from stand Toilet Transfer: Moderate assistance, +2 for physical assistance, +2 for safety/equipment, Ambulation, RW, Regular Toilet Toilet Transfer Details (indicate cue type and reason): cueing for safety, hand placement and technique; +2 mod assist for ambulation and RW mgmt  Toileting- Clothing Manipulation and Hygiene: Moderate assistance, Sit to/from stand, +2 for physical assistance, +2 for safety/equipment Toileting - Clothing Manipulation Details (indicate cue type and reason): clothing mgmt and hygiene, 2nd person for balance   Functional mobility during ADLs: Moderate assistance, Minimal assistance, +2 for physical assistance, +2  for safety/equipment, Rolling walker General ADL Comments: pt limited by L sided weakness and impaired coordination, decreased activity tolerance and cognitioin    Cognition: Cognition Overall Cognitive Status: Impaired/Different from baseline Orientation Level: Oriented X4 Cognition Arousal/Alertness: Awake/alert Behavior During Therapy: WFL for tasks assessed/performed Overall Cognitive Status: Impaired/Different from baseline Area of Impairment: Safety/judgement, Problem solving, Following commands, Awareness, Attention Current Attention Level: Sustained Following Commands: Follows one step commands consistently, Follows one step commands with increased time, Follows multi-step commands inconsistently Safety/Judgement: Decreased awareness of safety, Decreased awareness of deficits Awareness: Emergent Problem Solving: Requires verbal cues, Slow processing, Difficulty sequencing General Comments: patient with decreased awareness of deficits and safety, requires constant cueing for multistep/dual task processing   There were no vitals taken for this visit. Physical Exam  General: Alert and oriented x 3, No apparent distress HEENT: Head is normocephalic, atraumatic, PERRLA, EOMI, sclera anicteric, oral mucosa pink and moist, dentition intact, ext ear canals clear,  Neck: Supple without JVD or lymphadenopathy Heart: Reg rate and rhythm. No murmurs rubs or gallops Chest: CTA bilaterally without wheezes, rales, or rhonchi; no distress Abdomen: Soft, non-tender, non-distended, bowel sounds positive. Extremities: No clubbing, cyanosis, or edema. Pulses are 2+ Skin: Clean and intact without signs of breakdown Neuro: Pt is cognitively appropriate with normal insight, memory, and awareness. Cranial nerves 2-12 are intact except for left facial droop. Dysarthric speech with occasional wet voice. Able to follow basic commands without difficulty.  LLE 2/5 HF, KE, DF, PF. LUE 4/5 throughout Psych: Pt's affect is appropriate. Pt is cooperative   Results for orders placed or performed during the hospital encounter of 10/05/19 (from the past 48 hour(s))   Basic metabolic panel     Status: Abnormal   Collection Time: 10/11/19  2:30 AM  Result Value Ref Range   Sodium 137 135 - 145 mmol/L   Potassium 3.9 3.5 - 5.1 mmol/L   Chloride 104 98 - 111 mmol/L   CO2 22 22 - 32 mmol/L   Glucose, Bld 103 (H) 70 - 99 mg/dL    Comment: Glucose reference range applies only to samples taken after fasting for at least 8 hours.   BUN 13 8 - 23 mg/dL   Creatinine, Ser 0.87 0.44 - 1.00 mg/dL   Calcium 10.1 8.9 - 10.3 mg/dL   GFR calc non Af Amer >60 >60 mL/min   GFR calc Af Amer >60 >60 mL/min   Anion gap 11 5 - 15    Comment: Performed at [facility] [address], [place], [place] [address]  D-dimer, quantitative (not at [facility])     Status: Abnormal   Collection Time: 10/11/19  2:30 AM  Result Value Ref Range   D-Dimer, Quant 0.99 (H) 0.00 - 0.50 ug/mL-FEU    Comment: (NOTE) At the manufacturer cut-off of 0.50 ug/mL FEU, this assay has been documented to exclude PE with a sensitivity and negative predictive value of 97 to 99%.  At this time, this assay has not been approved by the FDA to exclude DVT/VTE. Results should be correlated with clinical presentation. Performed at [facility], [place] [address], [place], [place] [address]   Magnesium     Status: None   Collection Time: 10/11/19  2:30 AM  Result Value Ref Range   Magnesium 1.9 1.7 - 2.4 mg/dL    Comment: Performed at [facility] [address], [place], [place] [address]  Basic metabolic panel     Status: Abnormal  Collection Time: 10/12/19  2:13 PM  Result Value Ref Range   Sodium 136 135 - 145 mmol/L   Potassium 3.8 3.5 - 5.1 mmol/L   Chloride 104 98 - 111 mmol/L   CO2 19 (L) 22 - 32 mmol/L   Glucose, Bld 133 (H) 70 - 99 mg/dL    Comment: Glucose reference range applies only to samples taken after fasting for at least 8 hours.   BUN 24 (H) 8 - 23 mg/dL   Creatinine, Ser 1.10 (H) 0.44 - 1.00 mg/dL   Calcium 9.9 8.9 - 10.3 mg/dL   GFR calc non Af Amer 48 (L)  >60 mL/min   GFR calc Af Amer 56 (L) >60 mL/min   Anion gap 13 5 - 15    Comment: Performed at [facility] [address] [place], [place] [address]  D-dimer, quantitative (not at [facility])     Status: Abnormal   Collection Time: 10/12/19  2:13 PM  Result Value Ref Range   D-Dimer, Quant 1.03 (H) 0.00 - 0.50 ug/mL-FEU    Comment: (NOTE) At the manufacturer cut-off of 0.50 ug/mL FEU, this assay has been documented to exclude PE with a sensitivity and negative predictive value of 97 to 99%.  At this time, this assay has not been approved by the FDA to exclude DVT/VTE. Results should be correlated with clinical presentation. Performed at [facility], [place] [address] [place], [place] [address]   Magnesium     Status: None   Collection Time: 10/12/19  2:13 PM  Result Value Ref Range   Magnesium 1.8 1.7 - 2.4 mg/dL    Comment: Performed at [facility] [address], [place], [place] [address]   No results found.     Medical Problem List and Plan: 1.  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  Antithrombotics: -DVT/anticoagulation:  Pharmaceutical: Lovenox  -antiplatelet therapy: DAPT x3 weeks followed by Plavix alone 3. Pain Management: Tylenol prn.  4. Mood: LCSW to follow for evaluation and support.   -antipsychotic agents: N/A 5. Neuropsych: This patient is capable of making decisions on her own behalf. 6. Skin/Wound Care: Routine pressure-relief measures 7. Fluids/Electrolytes/Nutrition: Monitor intake/output. Electrolytes stable on 9/3. Check electrolytes in a.m. 8. HTN: Monitor blood pressure 3 times daily. Continue amlodipine. 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 10. Obstipation: Has resolved and is refusing laxative--multiple BMs 09/01 and BP on 9/3. Will encourage miralax daily.    11. Prediabetes:  Hgb A1c-5.8. Educate on appropriate diet.   Bary Leriche, PA-C 10/12/2019   I have personally performed a face to face diagnostic evaluation, including, but not limited to relevant history and physical exam findings, of this patient and developed relevant assessment and plan.  Additionally, I have reviewed and concur with the physician assistant's documentation above.  The patient's status has not changed. The original post admission physician evaluation remains appropriate, and any changes from the pre-admission screening or documentation from the acute chart are noted above.   Leeroy Cha, MD

## 2019-10-12 NOTE — Progress Notes (Signed)
Physical Therapy Treatment Patient Details Name: [place] MRN: [id number] DOB: January 15, 1941 Today's Date: 10/12/2019    History of Present Illness Pt is a 79 y.o. F with significant PMH of colon cancer who presents with worsening left lower extremity weakness and difficulty with ambulation. 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 MRI thoracic spine showing mild superior endplate fracture of Y78 with bone marrow edema consistent with acute fracture. Pt reporting evolving weakness in L UE 10/08/19. Pt dx with L LE weakness due to 61mm acute/subacute CVA with chronic lacunar infarcts and high Grade V3-V4 stenosis, also reviewed by neurosurgery a mild acute T12 compression fx, but nothing that they would consider the cause of her L LE weakness.     PT Comments    Pt is progressing towards goals, able to walk around the room with one person mod assist and RW today (not two).  She still needs significant assist to progress left leg forward and stabilize the L knee during stance.  She is eager to get to inpatient rehab and to get better.  She is due to d/c there later today.  PT to follow acutely until d/c confirmed.     Follow Up Recommendations  CIR     Equipment Recommendations  Rolling walker with 5" wheels;3in1 (PT)    Recommendations for Other Services       Precautions / Restrictions Precautions Precautions: Fall;Back    Mobility  Bed Mobility Overal bed mobility: Needs Assistance       Supine to sit: Min assist     General bed mobility comments: Min hand held assist to come to sitting from elevated HOB, using rail and partially rolling to get up.   Transfers Overall transfer level: Needs assistance Equipment used: Rolling walker (2 wheeled) Transfers: Sit to/from Stand Sit to Stand: Min assist         General transfer comment: Min assist to power up to RW, cues for safe hand placement and  support to power up and for balance once up.   Ambulation/Gait Ambulation/Gait assistance: Mod assist Gait Distance (Feet): 20 Feet (x2) Assistive device: Rolling walker (2 wheeled) Gait Pattern/deviations: Step-through pattern;Decreased stance time - left;Decreased dorsiflexion - left;Decreased weight shift to left;Trunk flexed Gait velocity: decreased Gait velocity interpretation: <1.8 ft/sec, indicate of risk for recurrent falls General Gait Details: Pt with difficulty progressing her left leg forward due to weakness, assist from therapist to progress leg forward, prevent it from scissoring and stabilize in stance (pt tends to hyper extend in stance for stability in WB when stepping forward on R leg). Pt fatigues quickly and becomes more unstable over her left leg with fatigue.  cues for upright posture and safe use of RW.    Stairs             Wheelchair Mobility    Modified Rankin (Stroke Patients Only) Modified Rankin (Stroke Patients Only) Pre-Morbid Rankin Score: No significant disability Modified Rankin: Moderately severe disability     Balance Overall balance assessment: Needs assistance Sitting-balance support: Feet supported;No upper extremity supported Sitting balance-Leahy Scale: Fair Sitting balance - Comments: close supervision EOB.  Postural control: Right lateral lean Standing balance support: Bilateral upper extremity supported Standing balance-Leahy Scale: Poor Standing balance comment: needs external support in standing from therapist and RW  Cognition Arousal/Alertness: Awake/alert Behavior During Therapy: WFL for tasks assessed/performed                                   General Comments: Not specifically tested, conversation normal, processing speed normal. Aware of her LE and UE deficits.       Exercises      General Comments General comments (skin integrity, edema, etc.): Pt able to  preform pericare in bathroom with min assist.       Pertinent Vitals/Pain Pain Assessment: No/denies pain    Home Living                      Prior Function            PT Goals (current goals can now be found in the care plan section) Acute Rehab PT Goals Patient Stated Goal: improve strength Progress towards PT goals: Progressing toward goals    Frequency    Min 4X/week      PT Plan Current plan remains appropriate    Co-evaluation              AM-PAC PT "6 Clicks" Mobility   Outcome Measure  Help needed turning from your back to your side while in a flat bed without using bedrails?: A Little Help needed moving from lying on your back to sitting on the side of a flat bed without using bedrails?: A Little Help needed moving to and from a bed to a chair (including a wheelchair)?: A Little Help needed standing up from a chair using your arms (e.g., wheelchair or bedside chair)?: A Little Help needed to walk in hospital room?: A Lot Help needed climbing 3-5 steps with a railing? : Total 6 Click Score: 15    End of Session Equipment Utilized During Treatment: Gait belt Activity Tolerance: Patient limited by fatigue Patient left: in bed;Other (comment);with call bell/phone within reach;with bed alarm set (seated EOB)   PT Visit Diagnosis: Unsteadiness on feet (R26.81);Other abnormalities of gait and mobility (R26.89);History of falling (Z91.81);Hemiplegia and hemiparesis Hemiplegia - Right/Left: Left Hemiplegia - dominant/non-dominant: Non-dominant Hemiplegia - caused by: Cerebral infarction     Time: 1610-9604 PT Time Calculation (min) (ACUTE ONLY): 26 min  Charges:  $Gait Training: 8-22 mins $Therapeutic Activity: 8-22 mins                     Verdene Lennert, PT, DPT  Acute Rehabilitation [phone] pager [phone] office

## 2019-10-12 NOTE — H&P (Addendum)
Physical Medicine and Rehabilitation Admission H&P    Chief Complaint  Patient presents with  .     HPI:  Melissa Holt is a 24 female with history of colon cancer--no medical care since colon surgery and had been in relatively good health till fall 1 week prior to admission. She developed bilateral hip pain then started having difficulty standing due to BLE weakness. She was found to have LLE weakness and was transferred to [facility] on 10/05/2019 for work-up and management. MRI brain was negative for acute changes. MRI lumbar and thoracic spine revealed acute mild endplate T12 fracture without stenosis and L5-S1 anterolisthesis with subarticular stenosis. MRI pelvis showed normal lumbosacral plexus with mild bilateral hip OA. 2D echo showed EF of 60 to 65% with moderate LVH and mild MVR. Carotid Dopplers were negative for ICA stenosis and showed incidental line large heterogeneous right thyroid.  She started developing progressive symptoms on 08/30 with LUE weakness and mild facial asymmetry. MRI brain done revealing 2 mm acute/early subacute infarct within right ventral medulla, chronic small vessel infarcts and moderate generalized parenchymal atrophy. 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 MRI C-spine showed mild canal stenosis C3-C4 and C4-C5 and neural foraminal stenosis. Dr. Leonie Man felt that stroke secondary to small vessel disease with resultant LLE weakness and recommended DAPT x3 weeks followed by Plavix alone. Outpatient follow-up recommended for small cerebral aneurysms. Dr. Marcello Moores evaluated spine films and felt that no structural spine disease responsible for leg weakness and to follow-up on outpatient basis for nonsurgical therapies. 5-day course of prednisone recommended by NS to help manage symptoms. She  continues to have deficits with mobility and ADL tasks. CIR recommended due to functional decline   Review of Systems  Constitutional: Negative for chills and fever.  HENT: Negative for hearing loss and tinnitus.   Eyes: Negative for blurred vision and double vision.  Respiratory: Negative for cough and hemoptysis.   Cardiovascular: Negative for chest pain and palpitations.  Gastrointestinal: Negative for heartburn and nausea.  Genitourinary: Negative for urgency.  Musculoskeletal: Positive for back pain. Negative for myalgias.  Skin: Negative for rash.  Neurological: Negative for dizziness and headaches.  Endo/Heme/Allergies: Negative for environmental allergies. Does not bruise/bleed easily.  Psychiatric/Behavioral: Negative for depression and suicidal ideas.      Past Medical History:  Diagnosis Date  . [facility])      Past Surgical History:  Procedure Laterality Date  . COLON SURGERY      Family History  Problem Relation Age of Onset  . Lung cancer Brother     Social History:  reports that she has never smoked. Her smokeless tobacco use includes snuff. She reports current alcohol use. She reports that she does not use drugs.    Allergies: No Known Allergies    No medications prior to admission.    Drug Regimen Review  Drug regimen was reviewed and remains appropriate with no significant issues identified  Home: Home Living Family/patient expects to be discharged to:: Private residence Living Arrangements: Alone Available Help at Discharge: Family, Available PRN/intermittently Type of Home: Mobile home Home Access: Stairs to enter Entrance Stairs-Number of Steps: 4 Entrance Stairs-Rails: Right, Left Home Layout: One level Bathroom Shower/Tub: Chiropodist: Standard Home Equipment: Environmental consultant - 2 wheels   Functional History: Prior Function Level of Independence: Needs  assistance Gait / Transfers Assistance Needed: Started using walker  ~1 week ago ADL's / Homemaking Assistance Needed: typically independent until 1 week PTA, 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 Comments: weakness in LEs with buckling over last several weeks  Functional Status:  Mobility: Bed Mobility Overal bed mobility: Needs Assistance Bed Mobility: Sit to Sidelying Rolling: Min guard Sidelying to sit: Mod assist, HOB elevated Supine to sit: Mod assist Sit to supine: Mod assist Sit to sidelying: Mod assist, HOB elevated General bed mobility comments: pt up in chair upon arrival, modA for LE management, instructed on how to use R LE to assist L LE Transfers Overall transfer level: Needs assistance Equipment used: Rolling walker (2 wheeled) Transfer via Lift Equipment: Stedy Transfers: Sit to/from [employer] to Stand: Min assist, +2 physical assistance, +2 safety/equipment General transfer comment: to power up and steady, cueing for hand placement and erect posture  Ambulation/Gait Ambulation/Gait assistance: Mod assist Gait Distance (Feet): 15 Feet (x2) Assistive device: Rolling walker (2 wheeled) Gait Pattern/deviations: Step-through pattern, Decreased stride length, Trunk flexed, Narrow base of support General Gait Details: initiatlly maxA for L LE advancement progressing to minA. Pt with hyperextension of L knee in stance phase, modA to prevent, during 2nd bout of ambulation pt beginning to advance with minA to clear foot Gait velocity: decr Gait velocity interpretation: <1.31 ft/sec, indicative of household ambulator    ADL: ADL Overall ADL's : Needs assistance/impaired Eating/Feeding: Minimal assistance, Sitting Grooming: Minimal assistance, Wash/dry hands, Oral care, Standing Grooming Details (indicate cue type and reason): min assist to manage items and posture support, PT providing support to L LE  Upper Body Bathing: Moderate assistance, Sitting Lower Body Bathing: Maximal assistance, Sit to/from stand Upper Body Dressing :  Moderate assistance, Sitting Lower Body Dressing: Maximal assistance, Sit to/from stand Toilet Transfer: Moderate assistance, +2 for physical assistance, +2 for safety/equipment, Ambulation, RW, Regular Toilet Toilet Transfer Details (indicate cue type and reason): cueing for safety, hand placement and technique; +2 mod assist for ambulation and RW mgmt  Toileting- Clothing Manipulation and Hygiene: Moderate assistance, Sit to/from stand, +2 for physical assistance, +2 for safety/equipment Toileting - Clothing Manipulation Details (indicate cue type and reason): clothing mgmt and hygiene, 2nd person for balance   Functional mobility during ADLs: Moderate assistance, Minimal assistance, +2 for physical assistance, +2 for safety/equipment, Rolling walker General ADL Comments: pt limited by L sided weakness and impaired coordination, decreased activity tolerance and cognitioin   Cognition: Cognition Overall Cognitive Status: Impaired/Different from baseline Orientation Level: Oriented X4 Cognition Arousal/Alertness: Awake/alert Behavior During Therapy: WFL for tasks assessed/performed Overall Cognitive Status: Impaired/Different from baseline Area of Impairment: Safety/judgement, Problem solving, Following commands, Awareness, Attention Current Attention Level: Sustained Following Commands: Follows one step commands consistently, Follows one step commands with increased time, Follows multi-step commands inconsistently Safety/Judgement: Decreased awareness of safety, Decreased awareness of deficits Awareness: Emergent Problem Solving: Requires verbal cues, Slow processing, Difficulty sequencing General Comments: patient with decreased awareness of deficits and safety, requires constant cueing for multistep/dual task processing   Blood pressure 128/60, pulse 72, temperature 98 F (36.7 C), temperature source Oral, resp. rate 20, height 4\' 11"  (1.499 m), weight 68 kg, SpO2 96 %. Physical Exam    General: Alert and oriented x 3, No apparent distress HEENT: Head is normocephalic, atraumatic, PERRLA, EOMI, sclera anicteric, oral mucosa pink and moist, dentition intact, ext ear canals clear,  Neck: Supple without JVD or lymphadenopathy Heart: Reg rate and rhythm. No murmurs rubs  or gallops Chest: CTA bilaterally without wheezes, rales, or rhonchi; no distress Abdomen: Soft, non-tender, non-distended, bowel sounds positive. Extremities: No clubbing, cyanosis, or edema. Pulses are 2+ Skin: Clean and intact without signs of breakdown Neuro: Pt is cognitively appropriate with normal insight, memory, and awareness. Cranial nerves 2-12 are intact except for left facial droop. Dysarthric speech with occasional wet voice. Able to follow basic commands without difficulty.  LLE 2/5 HF, KE, DF, PF. LUE 4/5 throughout Psych: Pt's affect is appropriate. Pt is cooperative   Results for orders placed or performed during the hospital encounter of 10/05/19 (from the past 48 hour(s))  Basic metabolic panel     Status: Abnormal   Collection Time: 10/11/19  2:30 AM  Result Value Ref Range   Sodium 137 135 - 145 mmol/L   Potassium 3.9 3.5 - 5.1 mmol/L   Chloride 104 98 - 111 mmol/L   CO2 22 22 - 32 mmol/L   Glucose, Bld 103 (H) 70 - 99 mg/dL    Comment: Glucose reference range applies only to samples taken after fasting for at least 8 hours.   BUN 13 8 - 23 mg/dL   Creatinine, Ser 0.87 0.44 - 1.00 mg/dL   Calcium 10.1 8.9 - 10.3 mg/dL   GFR calc non Af Amer >60 >60 mL/min   GFR calc Af Amer >60 >60 mL/min   Anion gap 11 5 - 15    Comment: Performed at [facility] [address] [place], [place] [address]  D-dimer, quantitative (not at [facility])     Status: Abnormal   Collection Time: 10/11/19  2:30 AM  Result Value Ref Range   D-Dimer, Quant 0.99 (H) 0.00 - 0.50 ug/mL-FEU    Comment: (NOTE) At the manufacturer cut-off of 0.50 ug/mL FEU, this assay has been documented to exclude PE with a  sensitivity and negative predictive value of 97 to 99%.  At this time, this assay has not been approved by the FDA to exclude DVT/VTE. Results should be correlated with clinical presentation. Performed at [facility], [place] [address] [place], [place] [address]   Magnesium     Status: None   Collection Time: 10/11/19  2:30 AM  Result Value Ref Range   Magnesium 1.9 1.7 - 2.4 mg/dL    Comment: Performed at [facility] [address], [place], [place] [address]   No results found.     Medical Problem List and Plan: 1.  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  Antithrombotics: -DVT/anticoagulation:  Pharmaceutical: Lovenox  -antiplatelet therapy: DAPT x3 weeks followed by Plavix alone 3. Pain Management: Tylenol prn.  4. Mood: LCSW to follow for evaluation and support.   -antipsychotic agents: N/A 5. Neuropsych: This patient is capable of making decisions on her own behalf. 6. Skin/Wound Care: Routine pressure-relief measures 7. Fluids/Electrolytes/Nutrition: Monitor intake/output. Electrolytes stable on 9/3. Check electrolytes in a.m. 8. HTN: Monitor blood pressure 3 times daily. Continue amlodipine. 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 10. Obstipation: Has resolved and is refusing laxative--multiple BMs 09/01 and BP on 9/3. Will encourage miralax daily.    11. Prediabetes: Hgb A1c-5.8. Educate on appropriate diet.   Bary Leriche, PA-C 10/12/2019   I have personally performed a face to face diagnostic evaluation, including, but not limited to relevant history and physical exam findings, of  this patient and developed relevant assessment and plan.  Additionally, I have reviewed and concur with the physician assistant's documentation above.  Leeroy Cha, MD

## 2019-10-12 NOTE — Progress Notes (Signed)
Melissa Gong, Melissa Holt  Rehab Admission Coordinator  Physical Medicine and Rehabilitation  PMR Pre-admission     Addendum  Date of Service:  10/10/2019 12:58 PM      Related encounter: ED to Hosp-Admission (Discharged) from 10/05/2019 in [place]       Show:Clear all [x] Manual[x] Template[x] Copied  Added by: [x] Melissa Gong, Melissa Holt  [] Hover for details PMR Admission Coordinator Pre-Admission Assessment  Patient: Melissa Holt is an 79 y.o., female MRN: [id number] DOB: 20-Aug-1940 Height: 4\' 11"  (149.9 cm) Weight: 68 kg                                                                                                                                                  Insurance Information HMO: yes    PPO:      PCP:      IPA:      80/20:      OTHER:  PRIMARY: [employer] medicare   Policy#: [id number]      Subscriber: pt CM Name: Melissa Holt      Phone#: [phone]     Fax#: [phone] Pre-Cert#: [id number]   Approved for 7 days  Employer:  Benefits:  Phone #: [phone]     Name: 9/1 Eff. Date: 10/10/2019     Deduct: none      Out of Pocket Max: $5900      Life Max: none  CIR: $295 co pay per day days 1 until 5      SNF: no copay days 1 until 20; $184 co pay per day days 21 until 53; no copay days 54 until 100 Outpatient: $20 per visit     Co-Pay: visit limited by medical neccesity Home Health: 100%      Co-Pay: visit limited by medical neccesity DME: 80%     Co-Pay: 205 Providers: in network  SECONDARY: none      Policy#:       Phone#:   Development worker, community:       Phone#:   The Therapist, art Information Summary" for patients in Inpatient Rehabilitation Facilities with attached "Privacy Act [place] Records" was provided and verbally reviewed with: Patient  Emergency Contact Information         Contact Information    Name Relation Home Work Mobile   Melissa Holt Granddaughter [phone]  Melissa Holt  Daughter [phone]  [phone]   Melissa Holt   [phone]     Current Medical History  Patient Admitting Diagnosis: CVA  History of Present Illness: Melissa Holt a 79 y.o.femalewith history of colon cancer, no medical care since colon surgery and has been in relatively good health till fall 1 weeks PTA. She developed bilateral hip pain and then started having difficulty standing due to BLE weakness. She was found to have to have LLE weakness and [facility] to  [facility]. MRI brain negative for acute changes. MRI lumbar and thoracic spine done revealing acute mild endplate T12 fracture without stenosis and L5-S1 anterolisthesis with facet degeneration and subarticular stenosis. MRI pelvis showed normal lumbosacral plexus with mild bilateral hip OA. 2D echo showed EF 60-65% with moderate LVH and mild MVR. Carotid dopplers without significant stenosis and incidental found to have enlarged heterogenous right thyroid.   She started developing progressive symptoms on 8/30 with LUE weakness and mild facial asymmetry. MRI brain done revealing 2 mm acute/early subacute infarct within ventral right medulla, chronic small vessel infarcts and moderate generalized parenchymal atrophy. 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 MRI C spine showed mild canal stenosis C3/4 and C4/5 and neural foraminal stenosis C4/C7/C8>C6. Therapy ongoing and patient limited by left sided weakness with dysarthria, poor awareness of deficits as well as delayed processing.  Neurosurgery consulted to review spine imaging . 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 In her thoracic spine, there is a mild acute T12 compression fracture. In her lumbar  spine, there is multilevel degenerative disease with moderate foraminal stenosis but no significant neural element impingement. Melissa Holt saw patient and did not see any structural spine disease that would be responsible for any leg weakness, acute or otherwise. Felt she has lumbar radiculopathic/sciatica symptoms, she can f/u in neurosurgery clinic but nonsurgical therapies and possibly injections would be recommended.  .Complete NIHSS TOTAL: 1  Past Medical History      Past Medical History:  Diagnosis Date  . Cancer [facility])     Family History  family history includes Lung cancer in her brother.  Prior Rehab/Hospitalizations:  Has the patient had prior rehab or hospitalizations prior to admission? Yes  Has the patient had major surgery during 100 days prior to admission? No  Current Medications   Current Facility-Administered Medications:  .  0.9 %  sodium chloride infusion, 250 mL, Intravenous, PRN, Melissa Mare, Melissa Holt .  amLODipine (NORVASC) tablet 5 mg, 5 mg, Oral, Daily, Holt, Ava, DO, 5 mg at 10/12/19 0923 .  aspirin EC tablet 81 mg, 81 mg, Oral, Daily, Melissa Mare, Melissa Holt, 81 mg at 10/12/19 0923 .  atorvastatin (LIPITOR) tablet 80 mg, 80 mg, Oral, Daily, Donnetta Simpers, Melissa Holt, 80 mg at 10/12/19 0924 .  bisacodyl (DULCOLAX) suppository 10 mg, 10 mg, Rectal, Daily PRN, Melissa Mare, Melissa Holt .  clopidogrel (PLAVIX) tablet 75 mg, 75 mg, Oral, Daily, Garvin Fila, Melissa Holt, 75 mg at 10/12/19 0923 .  enoxaparin (LOVENOX) injection 40 mg, 40 mg, Subcutaneous, Q24H, Melissa Mare, Melissa Holt, 40 mg at 10/11/19 2050 .  labetalol (NORMODYNE) injection 10 mg, 10 mg, Intravenous, Q2H PRN, Opyd, Ilene Qua, Melissa Holt .  lactated ringers infusion, , Intravenous, Continuous, Melissa Mare, Melissa Holt, Last Rate: 75 mL/hr at 10/05/19 2216, New Bag at 10/05/19 2216 .  lactulose (CHRONULAC) 10 GM/15ML solution 20 g, 20 g, Oral, TID, Holt, Ava, DO, 20 g at 10/10/19 1002 .  LORazepam (ATIVAN)  tablet 1 mg, 1 mg, Oral, PRN, Holt, Ava, DO .  ondansetron (ZOFRAN) tablet 4 mg, 4 mg, Oral, Q6H PRN **OR** ondansetron (ZOFRAN) injection 4 mg, 4 mg, Intravenous, Q6H PRN, Melissa Mare, Melissa Holt .  polyethylene glycol (MIRALAX / GLYCOLAX) packet 17 g, 17 g, Oral, Daily PRN, Amin, Ankit  Chirag, Melissa Holt, 17 g at 10/12/19 0923 .  predniSONE (DELTASONE) tablet 40 mg, 40 mg, Oral, Q breakfast, Amin, Ankit Chirag, Melissa Holt, 40 mg at 10/12/19 0923 .  senna-docusate (Senokot-S) tablet 2 tablet, 2 tablet, Oral, BID, Amin, Ankit Chirag, Melissa Holt, 2 tablet at 10/10/19 1003 .  sodium chloride flush (NS) 0.9 % injection 3 mL, 3 mL, Intravenous, Q12H, Melissa Mare, Melissa Holt, 3 mL at 10/11/19 2050 .  sodium chloride flush (NS) 0.9 % injection 3 mL, 3 mL, Intravenous, PRN, Melissa Mare, Melissa Holt  Patients Current Diet:     Diet Order                  Diet regular Room service appropriate? Yes; Fluid consistency: Thin  Diet effective now                  Precautions / Restrictions Precautions Precautions: Fall, Back Precaution Comments: acute spinal fracture - log roll in/out of bed Restrictions Weight Bearing Restrictions: No   Has the patient had 2 or more falls or a fall with injury in the past year?Yes  Prior Activity Level Limited Community (1-2x/wk): Mod I with RW  Prior Functional Level Prior Function Level of Independence: Needs assistance Gait / Transfers Assistance Needed: Started using walker ~1 week ago ADL's / Homemaking Assistance Needed: typically independent until 1 week PTA, 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  Independent  Indoor Mobility: Did the patient need assistance with walking from room to room (with or without device)? Independent  Stairs: Did the patient need assistance with internal or external stairs (with or  without device)? Independent  Functional Cognition: Did the patient need help planning regular tasks such as shopping or remembering to take medications? [place] Devices/Equipment: Environmental consultant (specify type) (2 wheels) Home Equipment: Walker - 2 wheels  Prior Device Use: Indicate devices/aids used by the patient prior to current illness, exacerbation or injury? Walker  Current Functional Level Cognition  Overall Cognitive Status: Impaired/Different from baseline Current Attention Level: Sustained Orientation Level: Oriented X4 Following Commands: Follows one step commands consistently, Follows one step commands with increased time, Follows multi-step commands inconsistently Safety/Judgement: Decreased awareness of safety, Decreased awareness of deficits General Comments: patient with decreased awareness of deficits and safety, requires constant cueing for multistep/dual task processing    Extremity Assessment (includes Sensation/Coordination)  Upper Extremity Assessment: LUE deficits/detail LUE Deficits / Details: 3-/5 shoulder, 4-/5 elbow, 3+/5 forearm and gross grasp, 3/5 wrist LUE Coordination: decreased fine motor, decreased gross motor  Lower Extremity Assessment: Defer to PT evaluation RLE Deficits / Details: Strength 5/5 LLE Deficits / Details: Hip flexion 1/5, knee extension 4/5, ankle dorsiflexion 1/5    ADLs  Overall ADL's : Needs assistance/impaired Eating/Feeding: Minimal assistance, Sitting Grooming: Minimal assistance, Wash/dry hands, Oral care, Standing Grooming Details (indicate cue type and reason): min assist to manage items and posture support, PT providing support to L LE  Upper Body Bathing: Moderate assistance, Sitting Lower Body Bathing: Maximal assistance, Sit to/from stand Upper Body Dressing : Moderate assistance, Sitting Lower Body Dressing: Maximal assistance, Sit to/from stand Toilet Transfer:  Moderate assistance, +2 for physical assistance, +2 for safety/equipment, Ambulation, RW, Regular Toilet Toilet Transfer Details (indicate cue type and reason): cueing for safety, hand placement and technique; +2 mod assist for  ambulation and RW mgmt  Toileting- Clothing Manipulation and Hygiene: Moderate assistance, Sit to/from stand, +2 for physical assistance, +2 for safety/equipment Toileting - Clothing Manipulation Details (indicate cue type and reason): clothing mgmt and hygiene, 2nd person for balance   Functional mobility during ADLs: Moderate assistance, Minimal assistance, +2 for physical assistance, +2 for safety/equipment, Rolling walker General ADL Comments: pt limited by L sided weakness and impaired coordination, decreased activity tolerance and cognitioin     Mobility  Overal bed mobility: Needs Assistance Bed Mobility: Sit to Sidelying Rolling: Min guard Sidelying to sit: Mod assist, HOB elevated Supine to sit: Mod assist Sit to supine: Mod assist Sit to sidelying: Mod assist, HOB elevated General bed mobility comments: pt up in chair upon arrival, modA for LE management, instructed on how to use R LE to assist L LE    Transfers  Overall transfer level: Needs assistance Equipment used: Rolling walker (2 wheeled) Transfer via Lift Equipment: Stedy Transfers: Sit to/from [employer] to Stand: Min assist, +2 physical assistance, +2 safety/equipment General transfer comment: to power up and steady, cueing for hand placement and erect posture     Ambulation / Gait / Stairs / Wheelchair Mobility  Ambulation/Gait Ambulation/Gait assistance: Mod assist Gait Distance (Feet): 15 Feet (x2) Assistive device: Rolling walker (2 wheeled) Gait Pattern/deviations: Step-through pattern, Decreased stride length, Trunk flexed, Narrow base of support General Gait Details: initiatlly maxA for L LE advancement progressing to minA. Pt with hyperextension of L knee in stance phase, modA to  prevent, during 2nd bout of ambulation pt beginning to advance with minA to clear foot Gait velocity: decr Gait velocity interpretation: <1.31 ft/sec, indicative of household ambulator    Posture / Balance Dynamic Sitting Balance Sitting balance - Comments: posterior leaning Balance Overall balance assessment: Needs assistance Sitting-balance support: No upper extremity supported, Feet supported Sitting balance-Leahy Scale: Fair Sitting balance - Comments: posterior leaning Postural control: Posterior lean Standing balance support: Single extremity supported, Bilateral upper extremity supported, During functional activity Standing balance-Leahy Scale: Poor Standing balance comment: pt stood at sink with OT to do ADls, pt dep on PT to prevent L knee hyperextension, pt with strong R lateral lean onto elbows. PT worked on weight shifting to the L while standing, stood x 5 min    Special needs/care consideration    Previous Home Environment  Living Arrangements: Alone Available Help at Discharge: Family, Available PRN/intermittently Type of Home: Mobile home Home Layout: One level Home Access: Stairs to enter Entrance Stairs-Rails: Right, Left Entrance Stairs-Number of Steps: 4 Bathroom Shower/Tub: Chiropodist: [place]: No  Discharge Living Setting Plans for Discharge Living Setting: Patient's home, Alone, Mobile Home Type of Home at Discharge: Mobile home Discharge Home Layout: One level Discharge Home Access: Stairs to enter Entrance Stairs-Rails: Right, Left Entrance Stairs-Number of Steps: 4 Discharge Bathroom Shower/Tub: Tub/shower unit Discharge Bathroom Toilet: Standard Discharge Bathroom Accessibility: Yes How Accessible: Accessible via walker Does the patient have any problems obtaining your medications?: No  Social/Family/Support Systems Patient Roles: Parent Contact Information: Granddaughter, Insurance claims handler Anticipated  Caregiver: chilren intermitently Anticipated Caregiver's Contact Information: see above Ability/Limitations of Caregiver: family work and come prn Caregiver Availability: Intermittent Discharge Plan Discussed with Primary Caregiver: Yes Is Caregiver In Agreement with Plan?: Yes Does Caregiver/Family have Issues with Lodging/Transportation while Pt is in Rehab?: No  Goals Patient/Family Goal for Rehab: Mod I with PT, OT, and SLP Expected length of stay: ELOS 10 to 14 days Pt/Family Agrees to  Admission and willing to participate: Yes Program Orientation Provided & Reviewed with Pt/Caregiver Including Roles  & Responsibilities: Yes  Decrease burden of Care through IP rehab admission: n/a  Possible need for SNF placement upon discharge:not anticipated  Patient Condition: I have reviewed medical records from [facility], spoken with CM, and patient. I met with patient at the bedside for inpatient rehabilitation assessment.  Patient will benefit from ongoing PT, OT and SLP, can actively participate in 3 hours of therapy a day 5 days of the week, and can make measurable gains during the admission.  Patient will also benefit from the coordinated team approach during an Inpatient Acute Rehabilitation admission.  The patient will receive intensive therapy as well as Rehabilitation physician, nursing, social worker, and care management interventions.  Due to bladder management, bowel management, safety, skin/wound care, disease management, medication administration, pain management and patient education the patient requires 24 hour a day rehabilitation nursing.  The patient is currently min to mod assist overall with mobility and basic ADLs.  Discharge setting and therapy post discharge at home with home health is anticipated.  Patient has agreed to participate in the Acute Inpatient Rehabilitation Program and will admit today.  Preadmission Screen Completed By:  Cleatrice Burke, Melissa Holt,  10/12/2019 2:57 PM ______________________________________________________________________   Discussed status with Melissa. Ranell Patrick on 10/12/2019 at  1458 and received approval for admission today.  Admission Coordinator:  Cleatrice Burke, time 6770 Date 10/12/2019       Cosigned by: Izora Ribas, Melissa Holt at 10/12/2019 5:14 PM  Revision History                                         Note Details  Melissa Holt Melissa Gong, Melissa Holt File Time 10/12/2019 4:25 PM  Melissa Holt Type Rehab Admission Coordinator Status Addendum  Last Editor Melissa Gong, Melissa Holt Service Physical Medicine and [place] # [id number] Admit Date 10/12/2019

## 2019-10-12 NOTE — Discharge Summary (Signed)
Physician Discharge Summary  [facility] WNU:[id number] DOB: February 28, 1940 DOA: 10/05/2019  PCP: Patient, No Pcp Per  Admit date: 10/05/2019 Discharge date: 10/12/2019  Admitted From: Home Disposition: CIR  Recommendations for Outpatient Follow-up:  1. Follow up with PCP in 1-2 weeks 2. Please obtain BMP/CBC in one week your next doctors visit.  3. Aspirin Plavix for 3 weeks followed by aspirin alone 4. Follow-up outpatient neurology in 3 weeks 5. Take oral prednisone 40 mg daily to complete total 5-day course. 6. Outpatient follow-up with neurosurgery, Dr. Duffy Rhody in 2-3 weeks   Discharge Condition: Stable CODE STATUS: Full code Diet recommendation: Heart healthy  Brief/Interim Summary: 79 year old with history of ambulatory dysfunction, colon cancer, dementia, essential hypertension initially presented to the hospital for left lower extremity weakness 4 days prior to admission but intermittent falls at home 2 weeks prior to that. Work-up in the ER was negative for CVA including CT head and MRI brain. Telemetry neuro suspected lumbar pathology therefore MRI lumbar spine and CT lumbar and thoracic spine was ordered. This demonstrated lumbar sacral stenosis, mild superior endplate fracture I34 with marrow edema. Echocardiogram showed EF 60 to 65%, carotid Dopplers did not show stenosis but large slightly abnormal appearing thyroid. LDL 186. Due to change in mental status, repeat MRI was done by neurology which demonstrated small acute/subacute CVA with chronic lacunar infarct. MRA showed high-grade V3-V4 stenosis, moderate stenosis of the right and left ICA. MRI C-spine showed foraminal stenosis.  PT recommended CIR therefore arrangements were made.  Briefly discussed her MRI thoracic and lumbar findings with neurosurgery who recommended course of steroids and outpatient follow-up.    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 -Seen by neurology team -PT/OT recommending CIR. arrangements to be made by [facility] team.  Abnormal appearance of thyroid/goiter -No focal nodules. Follow-up outpatient with PCP. -TSH is within normal limits.  Mild Acute T12 Compression Fracture  Multiple Area of Forminal Stenosis, Lumbar and Sacral -Spoke with Neurosugery who reviewed her Scan, recommends Outpatient follow up. For now will do 5 day course of Prednisone 40mg  po daily.   Constipation with large stool burden; Improved.  -Out of bed to chair, daily bowel regimen.   Essential hypertension -On Norvasc  Hyperlipidemia -Uncontrolled, Lipitor 40 mg daily  Prolonged QTC -Resolved   Body mass index is 30.28 kg/m.         Discharge Diagnoses:  Principal Problem:   Weakness of left lower extremity Active Problems:   Left leg weakness      Consultations:  Neurology  Curbside neurosurgery  Subjective: Feels she can move her left lower extremity slightly better today otherwise no other complaints  Discharge Exam: Vitals:   10/12/19 0309 10/12/19 0758  BP: 109/65 133/73  Pulse: 71 66  Resp: 17 20  Temp: 97.8 F (36.6 C) 98 F (36.7 C)  SpO2: 95% 99%   Vitals:   10/11/19 2335 10/12/19 0309 10/12/19 0500 10/12/19 0758  BP: (!) 147/79 109/65  133/73  Pulse: 76 71  66  Resp: 15 17  20   Temp: 98 F (36.7 C) 97.8 F (36.6 C)  98 F (36.7 C)  TempSrc: Oral Oral  Oral  SpO2: 98% 95%  99%  Weight:   68 kg   Height:        General: Pt is alert, awake, not in acute  distress Cardiovascular: RRR, S1/S2 +, no rubs, no gallops Respiratory: CTA bilaterally, no wheezing, no rhonchi Abdominal: Soft, NT, ND, bowel sounds + Extremities: no edema, no cyanosis Left lower extremity strength 4/5, right lower extremity strength 4+/5.  Bilateral upper extremity strength 5/5.  No other focal  neuro deficit.  Discharge Instructions   Allergies as of 10/12/2019   No Known Allergies     Medication List    TAKE these medications   amLODipine 5 MG tablet Commonly known as: NORVASC Take 1 tablet (5 mg total) by mouth daily.   aspirin 81 MG EC tablet Take 1 tablet (81 mg total) by mouth daily for 21 days. Swallow whole.   atorvastatin 80 MG tablet Commonly known as: LIPITOR Take 1 tablet (80 mg total) by mouth daily.   bisacodyl 10 MG suppository Commonly known as: DULCOLAX Place 1 suppository (10 mg total) rectally daily as needed for severe constipation.   clopidogrel 75 MG tablet Commonly known as: PLAVIX Take 1 tablet (75 mg total) by mouth daily.   lactulose 10 GM/15ML solution Commonly known as: CHRONULAC Take 30 mLs (20 g total) by mouth 2 (two) times daily as needed for moderate constipation.   polyethylene glycol 17 g packet Commonly known as: MIRALAX / GLYCOLAX Take 17 g by mouth daily as needed for moderate constipation or severe constipation.   predniSONE 20 MG tablet Commonly known as: DELTASONE Take 2 tablets (40 mg total) by mouth daily with breakfast for 4 days.   senna-docusate 8.6-50 MG tablet Commonly known as: Senokot-S Take 2 tablets by mouth at bedtime.       Follow-up Information    Garvin Fila, MD. Schedule an appointment as soon as possible for a visit in 3 week(s).   Specialties: Neurology, Radiology Contact information: [address] [place] [address] [place]. Schedule an appointment as soon as possible for a visit in 2 week(s).   Contact information: [place] [address] [phone]             No Known Allergies  You were cared for by a hospitalist during your hospital stay. If you have any questions about your discharge medications or the care you received while you were in the hospital after you are  discharged, you can call the unit and asked to speak with the hospitalist on call if the hospitalist that took care of you is not available. Once you are discharged, your primary care physician will handle any further medical issues. Please note that no refills for any discharge medications will be authorized once you are discharged, as it is imperative that you return to your primary care physician (or establish a relationship with a primary care physician if you do not have one) for your aftercare needs so that they can reassess your need for medications and monitor your lab values.   Procedures/Studies: DG Abd 1 View  Result Date: 10/08/2019 CLINICAL DATA:  In-patient, abdominal pain EXAM: ABDOMEN - 1 VIEW COMPARISON:  Thoracic and lumbar spine imaging 10/06/2019 FINDINGS: Paucity of small bowel gas. No high-grade obstructive bowel gas pattern. Air-filled appearance of the colon without large colonic stool burden. Atelectatic changes present in the lung bases. No suspicious calcifications over the urinary tract or abdomen. Cholecystectomy clips in the right upper quadrant. Degenerative changes in the spine hips and pelvis. T12 compression fracture better seen on comparison cross-sectional imaging.  No other acute or suspicious osseous abnormalities. Soft tissues are unremarkable. Telemetry leads project over the lower chest and abdomen. IMPRESSION: Air-filled appearance of the colon without large colonic stool burden. No high-grade obstructive bowel gas pattern. Electronically Signed   By: Lovena Le M.D.   On: 10/08/2019 22:29   CT Head Wo Contrast  Result Date: 10/05/2019 CLINICAL DATA:  Weakness from the waist down beginning 3 days ago. EXAM: CT HEAD WITHOUT CONTRAST TECHNIQUE: Contiguous axial images were obtained from the base of the skull through the vertex without intravenous contrast. COMPARISON:  None. FINDINGS: Brain: Age related atrophy. Chronic small-vessel ischemic changes of the cerebral  hemispheric white matter. No sign of acute infarction, mass lesion, hemorrhage, hydrocephalus or extra-axial collection. Vascular: There is atherosclerotic calcification of the major vessels at the base of the brain. Skull: Negative Sinuses/Orbits: Clear/normal Other: None IMPRESSION: No acute finding by CT. Age related atrophy and chronic small-vessel ischemic changes of the white matter. Electronically Signed   By: Nelson Chimes M.D.   On: 10/05/2019 16:22   CT THORACIC SPINE W CONTRAST  Result Date: 10/06/2019 CLINICAL DATA:  Back pain with neuro deficit EXAM: CT THORACIC SPINE WITH CONTRAST TECHNIQUE: Multidetector CT images of thoracic was performed according to the standard protocol following intravenous contrast administration. CONTRAST:  100 cc Omnipaque 300 intravenous COMPARISON:  CT and MRI brain 10/05/2019 FINDINGS: Alignment: Mild dextroscoliosis of the thoracic spine. Sagittal alignment is within normal limits. Vertebrae: No acute fracture or focal pathologic process. Paraspinal and other soft tissues: No paraspinal soft tissue abnormality is evident. Heterogenous enlarged right lobe of thyroid gland. Disc levels: Diffuse degenerative osteophytes throughout the thoracic spine. No bony canal stenosis. IMPRESSION: 1. No CT evidence for acute osseous abnormality of the thoracic spine. 2. Diffuse degenerative osteophytes throughout the thoracic spine. 3. Heterogenous enlarged right lobe of thyroid gland. Nonemergent thyroid ultrasound may be pursued as clinically indicated. Electronically Signed   By: Donavan Foil M.D.   On: 10/06/2019 04:01   CT LUMBAR SPINE W CONTRAST  Result Date: 10/06/2019 CLINICAL DATA:  Fall with left leg weakness EXAM: CT LUMBAR SPINE WITH CONTRAST TECHNIQUE: Multidetector CT imaging of the lumbar spine was performed with intravenous contrast administration. CONTRAST:  100 cc Isovue 300 intravenous COMPARISON:  None. FINDINGS: Segmentation: 5 lumbar type vertebrae.  Alignment: Normal. Vertebrae: Age indeterminate mild superior endplate deformity at V95. Remaining vertebra demonstrate normal stature. Hemangioma at L3. Paraspinal and other soft tissues: No paravertebral or paraspinal soft tissue abnormality is visualized. Mild aortic atherosclerosis Disc levels: At T12-L1, maintained disc space. No canal stenosis. The foramen are patent. At L1-L2, minimal vacuum disc. Disc height is preserved. No significant canal stenosis. No bony foraminal narrowing. At L2-L3, maintained disc space. No significant canal stenosis. The foramen appear patent bilaterally. At L3-L4, maintained disc space. No significant canal stenosis. Mild facet degenerative change. The foramen appear patent bilaterally. At L4-L5, mild diffuse disc bulge without significant canal stenosis. Mild facet degenerative changes. The foramen are patent bilaterally. At L5-S1, maintained disc space. Mild diffuse disc bulge with mild canal stenosis. Prominent facet degenerative change. The foramen are grossly patent. IMPRESSION: 1. Age indeterminate mild superior endplate deformity at G38. Correlate for focal tenderness at this level. No retropulsion to suggest bony canal compromise. 2. Mild degenerative changes of the lumbar spine as described above. No high-grade canal or foraminal stenosis. 3. Follow-up MRI may be pursued depending on the level of clinical concern. Aortic Atherosclerosis (ICD10-I70.0). Electronically Signed   By:  Donavan Foil M.D.   On: 10/06/2019 04:09   MR ANGIO HEAD WO CONTRAST  Result Date: 10/08/2019 CLINICAL DATA:  Neuro deficit, acute, stroke suspected. Additional history obtained from [place] reports left arm weakness progressing to leg, unable to lift leg off bed, dysarthric speech, possible mild facial asymmetry. EXAM: MRI HEAD WITHOUT CONTRAST MRA HEAD WITHOUT CONTRAST TECHNIQUE: Multiplanar, multiecho pulse sequences of the brain and surrounding structures were  obtained without intravenous contrast. Angiographic images of the head were obtained using MRA technique without contrast. COMPARISON:  Brain MRI 10/05/2019, head CT 10/05/2019 FINDINGS: MRI HEAD FINDINGS Brain: Intermittent motion degradation. Most notably, there is moderate motion degradation of the coronal T2 weighted sequence. There is a 2 mm focus of restricted diffusion within the ventral right medulla consistent with acute/early subacute infarction (series 5, image 57). In retrospect, this was likely present on the prior MRI of 10/05/2019, although poorly demonstrated on the prior study. Stable, moderate generalized parenchymal atrophy. As before, there are chronic lacunar infarcts within the right corona radiata, basal ganglia, thalami and right middle cerebellar peduncle. Stable background moderate chronic small vessel ischemic changes within the cerebral white matter. No evidence of intracranial mass. No chronic intracranial blood products. No extra-axial fluid collection. No midline shift. Vascular: Reported below. Skull and upper cervical spine: No focal marrow lesion. Sinuses/Orbits: Visualized orbits show no acute finding. Mild ethmoid sinus mucosal thickening. Small left maxillary sinus mucous retention cyst. No significant mastoid effusion. MRA HEAD FINDINGS The intracranial internal carotid arteries. Significant atherosclerotic irregularity of both vessels with multifocal stenoses. Most notably, there is moderate to moderately severe stenosis of the cavernous/paraclinoid right internal carotid artery and up to moderate stenosis of the cavernous left internal carotid artery. There are multiple vascular outpouchings arising from the intracranial internal carotid arteries bilaterally which may reflect aneurysms or vessel lobulation from atherosclerotic disease. This includes a 3 mm inferiorly projecting vascular protrusion arising from the proximal cavernous right ICA (series 1089, image 4), a 1-2 mm  inferiorly projecting vascular protrusion arising from the proximal cavernous left ICA (series 1089, image 17). A 2 mm vascular protrusion arising from the supraclinoid right ICA has an appearance most suggestive of an infundibulum, although a small aneurysm is difficult to definitively exclude. Additional vascular protrusions arising from the paraclinoid internal carotid arteries bilaterally appear to reflect the ophthalmic artery origins (for instance as seen on series 8 9, images 4 and 13). The M1 middle cerebral arteries are patent with sites of mild stenosis bilaterally. Additionally, there is atherosclerotic irregularity of the M2 and more distal MCA branches bilaterally. However, no M2 proximal branch occlusion or high-grade proximal stenosis is identified. The anterior cerebral arteries are patent. There are moderate/severe focal stenoses at the origins of both A1 segments. The V3 and V4 right vertebral artery is poorly delineated, likely secondary to high-grade stenosis or occlusion. The intracranial left vertebral artery is patent with sites of up to moderate stenosis proximally and distally. 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 Mild atherosclerotic narrowing within the proximal basilar artery. The mid and distal basilar artery is patent without significant stenosis. The posterior cerebral arteries are patent bilaterally with diffuse mild-to-moderate atherosclerotic irregularity. IMPRESSION: MRI brain: 1. Intermittently motion degraded examination. 2. 2 mm acute/early subacute infarct within the ventral right medulla. 3. Redemonstrated chronic small-vessel infarcts within the right corona radiata, bilateral basal ganglia, thalami and right middle cerebellar peduncle. 4. Stable background moderate generalized  parenchymal atrophy and chronic small vessel ischemic disease. 5. Mild ethmoid sinus mucosal thickening. 6. Small left maxillary sinus  mucous retention cyst. MRA head: 1. Intracranial atherosclerotic disease with multifocal stenoses, as detailed and most notably as follows. 2. The V3 and V4 right vertebral artery is poorly delineated, likely secondary to high-grade stenosis or occlusion. 3. Sites of up to moderate stenosis within the V4 left vertebral artery proximally and distally. 4. Moderate to moderately severe stenosis of the cavernous/paraclinoid right ICA. 5. Moderate stenosis of the cavernous left ICA. 6. Moderate/severe stenoses within the proximal A1 anterior cerebral arteries bilaterally. 7. 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 8. 3 mm vascular protrusion arising from the proximal cavernous right ICA. 1-2 mm vascular protrusion arising from the proximal cavernous left ICA. These may reflect vessel lobulation from atherosclerotic disease or small aneurysms. 9. 2 mm vascular protrusion arising from the supraclinoid right ICA. This has an appearance most suggestive of an infundibulum, although a small aneurysm at this site is difficult to exclude. Electronically Signed   By: Kellie Simmering DO   On: 10/08/2019 20:43   MR BRAIN WO CONTRAST  Result Date: 10/08/2019 CLINICAL DATA:  Neuro deficit, acute, stroke suspected. Additional history obtained from [place] reports left arm weakness progressing to leg, unable to lift leg off bed, dysarthric speech, possible mild facial asymmetry. EXAM: MRI HEAD WITHOUT CONTRAST MRA HEAD WITHOUT CONTRAST TECHNIQUE: Multiplanar, multiecho pulse sequences of the brain and surrounding structures were obtained without intravenous contrast. Angiographic images of the head were obtained using MRA technique without contrast. COMPARISON:  Brain MRI 10/05/2019, head CT 10/05/2019 FINDINGS: MRI HEAD FINDINGS Brain: Intermittent motion degradation. Most notably, there is moderate motion degradation of the coronal T2 weighted sequence. There is a 2 mm focus of  restricted diffusion within the ventral right medulla consistent with acute/early subacute infarction (series 5, image 57). In retrospect, this was likely present on the prior MRI of 10/05/2019, although poorly demonstrated on the prior study. Stable, moderate generalized parenchymal atrophy. As before, there are chronic lacunar infarcts within the right corona radiata, basal ganglia, thalami and right middle cerebellar peduncle. Stable background moderate chronic small vessel ischemic changes within the cerebral white matter. No evidence of intracranial mass. No chronic intracranial blood products. No extra-axial fluid collection. No midline shift. Vascular: Reported below. Skull and upper cervical spine: No focal marrow lesion. Sinuses/Orbits: Visualized orbits show no acute finding. Mild ethmoid sinus mucosal thickening. Small left maxillary sinus mucous retention cyst. No significant mastoid effusion. MRA HEAD FINDINGS The intracranial internal carotid arteries. Significant atherosclerotic irregularity of both vessels with multifocal stenoses. Most notably, there is moderate to moderately severe stenosis of the cavernous/paraclinoid right internal carotid artery and up to moderate stenosis of the cavernous left internal carotid artery. There are multiple vascular outpouchings arising from the intracranial internal carotid arteries bilaterally which may reflect aneurysms or vessel lobulation from atherosclerotic disease. This includes a 3 mm inferiorly projecting vascular protrusion arising from the proximal cavernous right ICA (series 1089, image 4), a 1-2 mm inferiorly projecting vascular protrusion arising from the proximal cavernous left ICA (series 1089, image 17). A 2 mm vascular protrusion arising from the supraclinoid right ICA has an appearance most suggestive of an infundibulum, although a small aneurysm is difficult to definitively exclude. Additional vascular protrusions arising from the paraclinoid  internal carotid arteries bilaterally appear to reflect the ophthalmic artery origins (for instance as seen on series 8 9, images 4 and  72). The M1 middle cerebral arteries are patent with sites of mild stenosis bilaterally. Additionally, there is atherosclerotic irregularity of the M2 and more distal MCA branches bilaterally. However, no M2 proximal branch occlusion or high-grade proximal stenosis is identified. The anterior cerebral arteries are patent. There are moderate/severe focal stenoses at the origins of both A1 segments. The V3 and V4 right vertebral artery is poorly delineated, likely secondary to high-grade stenosis or occlusion. The intracranial left vertebral artery is patent with sites of up to moderate stenosis proximally and distally. 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 Mild atherosclerotic narrowing within the proximal basilar artery. The mid and distal basilar artery is patent without significant stenosis. The posterior cerebral arteries are patent bilaterally with diffuse mild-to-moderate atherosclerotic irregularity. IMPRESSION: MRI brain: 1. Intermittently motion degraded examination. 2. 2 mm acute/early subacute infarct within the ventral right medulla. 3. Redemonstrated chronic small-vessel infarcts within the right corona radiata, bilateral basal ganglia, thalami and right middle cerebellar peduncle. 4. Stable background moderate generalized parenchymal atrophy and chronic small vessel ischemic disease. 5. Mild ethmoid sinus mucosal thickening. 6. Small left maxillary sinus mucous retention cyst. MRA head: 1. Intracranial atherosclerotic disease with multifocal stenoses, as detailed and most notably as follows. 2. The V3 and V4 right vertebral artery is poorly delineated, likely secondary to high-grade stenosis or occlusion. 3. Sites of up to moderate stenosis within the V4 left vertebral artery proximally and distally. 4.  Moderate to moderately severe stenosis of the cavernous/paraclinoid right ICA. 5. Moderate stenosis of the cavernous left ICA. 6. Moderate/severe stenoses within the proximal A1 anterior cerebral arteries bilaterally. 7. 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 8. 3 mm vascular protrusion arising from the proximal cavernous right ICA. 1-2 mm vascular protrusion arising from the proximal cavernous left ICA. These may reflect vessel lobulation from atherosclerotic disease or small aneurysms. 9. 2 mm vascular protrusion arising from the supraclinoid right ICA. This has an appearance most suggestive of an infundibulum, although a small aneurysm at this site is difficult to exclude. Electronically Signed   By: Kellie Simmering DO   On: 10/08/2019 20:43   MR BRAIN WO CONTRAST  Result Date: 10/05/2019 CLINICAL DATA:  Acute neuro deficit.  Bilateral leg weakness. EXAM: MRI HEAD WITHOUT CONTRAST TECHNIQUE: Multiplanar, multiecho pulse sequences of the brain and surrounding structures were obtained without intravenous contrast. COMPARISON:  CT head 10/05/2019 FINDINGS: Brain: Moderate atrophy. Ventricular enlargement consistent with atrophy. Negative for acute infarct. Moderate chronic microvascular ischemic changes in the white matter. 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 Negative for hemorrhage or mass. Vascular: Loss of flow void distal right vertebral artery which may be small or occluded. Otherwise normal arterial flow voids Skull and upper cervical spine: No focal skeletal lesion. Degenerative changes in the cervical spine. Sinuses/Orbits: Mild mucosal edema paranasal sinuses. Mastoid clear bilaterally Negative orbit Other: None IMPRESSION: No acute abnormality Moderate atrophy and moderate chronic microvascular ischemic changes. Electronically Signed   By: Franchot Gallo M.D.   On: 10/05/2019 17:28   MR CERVICAL SPINE WO CONTRAST  Result Date: 10/08/2019 CLINICAL DATA:  Left upper  and lower extremity EXAM: MRI CERVICAL SPINE WITHOUT CONTRAST TECHNIQUE: Multiplanar, multisequence MR imaging of the cervical spine was performed. No intravenous contrast was administered. COMPARISON:  None. FINDINGS: Alignment: Physiologic. Vertebrae: Mildly heterogeneous marrow signal.  Acute abnormality. Cord: Normal Posterior Fossa, vertebral arteries, paraspinal tissues: Abnormal right vertebral artery flow void. Disc levels:  C1-2: Unremarkable. C2-3: Left-greater-than-right facet hypertrophy. Small disc bulge. There is no spinal canal stenosis. No neural foraminal stenosis. C3-4: Intermediate sized disc bulge with bilateral large uncovertebral osteophytes. Mild spinal canal stenosis. Severe bilateral neural foraminal stenosis. C4-5: Right-greater-than-left uncovertebral hypertrophy with intermediate disc bulge. Mild spinal canal stenosis. Severe right and moderate left neural foraminal stenosis. C5-6: Uncovertebral hypertrophy with small disc bulge. There is no spinal canal stenosis. Moderate bilateral neural foraminal stenosis. C6-7: Broad disc bulge with bilateral uncovertebral hypertrophy. There is no spinal canal stenosis. Severe bilateral neural foraminal stenosis. C7-T1: Small disc bulge small bilateral uncovertebral osteophytes. There is no spinal canal stenosis. Severe bilateral neural foraminal stenosis. IMPRESSION: 1. Severe bilateral C4, C7 and C8 neural foraminal stenosis. 2. Mild spinal canal stenosis at C3-4 and C4-5. 3. Moderate bilateral C6 neural foraminal stenosis. Electronically Signed   By: Ulyses Jarred M.D.   On: 10/08/2019 20:20   MR THORACIC SPINE WO CONTRAST  Result Date: 10/06/2019 CLINICAL DATA:  Back pain.  Fall 2 weeks ago EXAM: MRI THORACIC SPINE WITHOUT CONTRAST TECHNIQUE: Multiplanar, multisequence MR imaging of the thoracic spine was performed. No intravenous contrast was administered. COMPARISON:  CT thoracic and lumbar spine 10/06/2019 FINDINGS: Alignment:  Normal  alignment.  Mild kyphosis. Vertebrae: Mild superior endplate fracture B76 with bone marrow edema compatible with a recent fracture. No spinal stenosis. No other fracture or mass in the thoracic spine. Cord:  Normal signal and morphology.  No syrinx or cord lesion. Paraspinal and other soft tissues: Enlargement of the right lobe of the thyroid extending into the superior mediastinum compatible with goiter. Otherwise negative. Disc levels: Right-sided disc and osteophyte complex at T2-3 with mild right foraminal narrowing. Mild right foraminal narrowing at T3-4 due to disc and osteophyte complex. Mild left foraminal narrowing at T4-5 due to osteophyte. Mild degenerative changes throughout the thoracic spine with disc desiccation and mild facet degeneration. IMPRESSION: Mild superior endplate fracture E83 with bone marrow edema consistent with a recent fracture. No associated spinal stenosis. No other fracture Mild diffuse degenerative change in the thoracic spine. Electronically Signed   By: Franchot Gallo M.D.   On: 10/06/2019 18:30   MR LUMBAR SPINE WO CONTRAST  Result Date: 10/06/2019 CLINICAL DATA:  Thoracic compression fracture. Fall 2 weeks ago with back pain. EXAM: MRI LUMBAR SPINE WITHOUT CONTRAST TECHNIQUE: Multiplanar, multisequence MR imaging of the lumbar spine was performed. No intravenous contrast was administered. COMPARISON:  CT thoracic and lumbar spine 10/06/2019 FINDINGS: Segmentation:  Normal Alignment:  3 mm anterolisthesis L5-S1 otherwise normal alignment Vertebrae: Mild compression fracture superior endplate of T51 with associated bone marrow edema. This appears to be a recent fracture. No retropulsion of bone into the canal and no spinal stenosis. No other fracture or mass in the lumbar spine Conus medullaris and cauda equina: Conus extends to the L2 level. Conus and cauda equina appear normal. Paraspinal and other soft tissues: Negative for paraspinous mass or adenopathy. Disc levels:  T12-L1: Negative L1-2: Negative L2-3: Negative L3-4: Negative L4-5: Mild facet degeneration. Negative for disc protrusion. Mild subarticular stenosis bilaterally L5-S1: 3 mm anterolisthesis. Severe facet degeneration bilaterally. Moderate subarticular stenosis bilaterally. IMPRESSION: Mild superior endplate fracture of V61 with bone marrow edema consistent with acute fracture. Negative for spinal stenosis. No other fracture 3 mm anterolisthesis L5-S1 with severe facet degeneration and moderate subarticular stenosis bilaterally. Mild subarticular stenosis bilaterally at L4-5 Electronically Signed   By: Franchot Gallo M.D.   On: 10/06/2019 18:27   MR PELVIS W  WO CONTRAST  Result Date: 10/07/2019 CLINICAL DATA:  Acute onset left leg weakness associated with fall 5 days ago. Evaluate lumbosacral plexus. EXAM: MRI PELVIS WITHOUT AND WITH CONTRAST TECHNIQUE: Multiplanar multisequence MR imaging of the pelvis was performed both before and after administration of intravenous contrast. CONTRAST:  40mL GADAVIST GADOBUTROL 1 MMOL/ML IV SOLN COMPARISON:  MRI lumbar spine from yesterday. FINDINGS: Lumbar plexus: Normal lumbosacral plexus bilaterally. No abnormal enhancement, mass, or fluid along the course of the plexus. Symmetric, normal appearance of the bilateral femoral and sciatic nerves. No intradural enhancement. Bones: Incompletely visualized acute T12 superior endplate compression fracture better evaluated on spine MRI from yesterday. No additional fracture, dislocation, or avascular necrosis. Slightly lobulated, serpiginous T2 hyperintense, enhancing signal in the medial left acetabulum without surrounding marrow edema, likely vascular. No focal bone lesion. The visualized sacroiliac joints and symphysis pubis appear normal. Severe facet arthropathy at L5-S1. Mild bilateral hip osteoarthritis. Small paralabral cyst adjacent to the right posterosuperior labrum. No joint effusion. Trace fluid in the right greater  trochanteric bursa. Muscles and tendons Muscles and tendons: Bilateral hamstring tendinosis. The visualized gluteus and iliopsoas tendons appear normal. Low level edema deep to the iliacus muscles. Otherwise, no muscle edema or atrophy. Other findings Miscellaneous: Prior hysterectomy. The visualized internal abdominal and pelvic contents appear unremarkable. IMPRESSION: 1. Normal lumbosacral plexus. 2. Incompletely visualized acute T12 superior endplate compression fracture, better evaluated on spine MRI from yesterday. 3. Mild bilateral hip osteoarthritis. Small right paralabral cyst consistent with underlying labral tear. 4. Bilateral hamstring tendinosis. Electronically Signed   By: Titus Dubin M.D.   On: 10/07/2019 13:57   ECHOCARDIOGRAM COMPLETE  Result Date: 10/06/2019    ECHOCARDIOGRAM REPORT   Patient Name:   [facility] Date of Exam: 10/06/2019 Medical Rec #:  [id number]    Height:       59.0 in Accession #:    [id number]   Weight:       149.9 lb Date of Birth:  [birth year]    BSA:          32.632 m Patient Age:    8 years     BP:           153/79 mmHg Patient Gender: F            HR:           65 bpm. Exam Location:  Inpatient Procedure: 2D Echo Indications:    stroke 434.91  History:        Patient has no prior history of Echocardiogram examinations.  Sonographer:    Johny Chess Referring Phys: [id number] AVA SWAYZE IMPRESSIONS  1. Left ventricular ejection fraction, by estimation, is 60 to 65%. The left ventricle has normal function. The left ventricle has no regional wall motion abnormalities. There is moderate left ventricular hypertrophy. Left ventricular diastolic parameters are indeterminate.  2. Right ventricular systolic function is normal. The right ventricular size is normal. Tricuspid regurgitation signal is inadequate for assessing PA pressure.  3. Left atrial size was moderately dilated.  4. The mitral valve is grossly normal. Mild mitral valve regurgitation.  5. The aortic valve is  tricuspid. Aortic valve regurgitation is not visualized. Mild to moderate aortic valve sclerosis/calcification is present, without any evidence of aortic stenosis.  6. The inferior vena cava is normal in size with greater than 50% respiratory variability, suggesting right atrial pressure of 3 mmHg. FINDINGS  Left Ventricle: Left ventricular ejection fraction, by estimation, is 60 to 65%. The left  ventricle has normal function. The left ventricle has no regional wall motion abnormalities. The left ventricular internal cavity size was normal in size. There is  moderate left ventricular hypertrophy. Left ventricular diastolic parameters are indeterminate. Right Ventricle: The right ventricular size is normal. No increase in right ventricular wall thickness. Right ventricular systolic function is normal. Tricuspid regurgitation signal is inadequate for assessing PA pressure. Left Atrium: Left atrial size was moderately dilated. Right Atrium: Right atrial size was normal in size. Pericardium: There is no evidence of pericardial effusion. Presence of pericardial fat pad. Mitral Valve: The mitral valve is grossly normal. Mild to moderate mitral annular calcification. Mild mitral valve regurgitation. Tricuspid Valve: The tricuspid valve is grossly normal. Tricuspid valve regurgitation is trivial. Aortic Valve: The aortic valve is tricuspid. Aortic valve regurgitation is not visualized. Mild to moderate aortic valve sclerosis/calcification is present, without any evidence of aortic stenosis. Pulmonic Valve: The pulmonic valve was grossly normal. Pulmonic valve regurgitation is trivial. Aorta: The aortic root is normal in size and structure. Venous: The inferior vena cava is normal in size with greater than 50% respiratory variability, suggesting right atrial pressure of 3 mmHg. IAS/Shunts: No atrial level shunt detected by color flow Doppler.  LEFT VENTRICLE PLAX 2D LVIDd:         4.10 cm  Diastology LVIDs:         3.00 cm   LV e' lateral:   6.96 cm/s LV PW:         1.60 cm  LV E/e' lateral: 11.0 LV IVS:        1.40 cm  LV e' medial:    4.46 cm/s LVOT diam:     2.00 cm  LV E/e' medial:  17.1 LV SV:         76 LV SV Index:   46 LVOT Area:     3.14 cm  RIGHT VENTRICLE             IVC RV S prime:     13.20 cm/s  IVC diam: 1.10 cm TAPSE (M-mode): 2.0 cm LEFT ATRIUM             Index       RIGHT ATRIUM           Index LA diam:        4.40 cm 2.70 cm/m  RA Area:     12.50 cm LA Vol (A2C):   66.9 ml 41.00 ml/m RA Volume:   28.70 ml  17.59 ml/m LA Vol (A4C):   68.8 ml 42.16 ml/m LA Biplane Vol: 68.8 ml 42.16 ml/m  AORTIC VALVE LVOT Vmax:   117.00 cm/s LVOT Vmean:  77.700 cm/s LVOT VTI:    0.241 m  AORTA Ao Root diam: 2.90 cm Ao Asc diam:  3.30 cm MITRAL VALVE MV Area (PHT): 3.48 cm    SHUNTS MV Decel Time: 218 msec    Systemic VTI:  0.24 m MV E velocity: 76.30 cm/s  Systemic Diam: 2.00 cm MV A velocity: 91.30 cm/s MV E/A ratio:  0.84 Rozann Lesches MD Electronically signed by Rozann Lesches MD Signature Date/Time: 10/06/2019/4:59:03 PM    Final    US THYROID  Result Date: 10/08/2019 CLINICAL DATA:  Incidental on CT. Goiter with enlarged right lobe of thyroid by CT of the thoracic spine. EXAM: THYROID ULTRASOUND TECHNIQUE: Ultrasound examination of the thyroid gland and adjacent soft tissues was performed. COMPARISON:  CT of the thoracic spine on 10/05/2019 FINDINGS: Parenchymal Echotexture: Moderately heterogenous  Isthmus: 0.3 cm Right lobe: 7.3 x 2.9 x 5.4 cm Left lobe: 2.7 x 0.6 x 1.2 cm _________________________________________________________ Estimated total number of nodules >/= 1 cm: 0 Number of spongiform nodules >/=  2 cm not described below (TR1): 0 Number of mixed cystic and solid nodules >/= 1.5 cm not described below (TR2): 0 _________________________________________________________ No discrete nodules are seen within the thyroid gland. The right lobe is enlarged and heterogeneous in appearance without discrete nodule.  There are some areas dystrophic shadowing calcification as seen by CT. 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 No focal nodules are identified. The above is in keeping with the ACR TI-RADS recommendations - J Am Coll Radiol 2017;14:587-595. Electronically Signed   By: Aletta Edouard M.D.   On: 10/08/2019 09:52   VAS US CAROTID  Result Date: 10/07/2019 Carotid Arterial Duplex Study Indications:       Syncope, Weakness, Stroke workup. Comparison Study:  No prior studies. Performing Technologist: Darlin Coco, RDMS  Examination Guidelines: A complete evaluation includes B-mode imaging, spectral Doppler, color Doppler, and power Doppler as needed of all accessible portions of each vessel. Bilateral testing is considered an integral part of a complete examination. Limited examinations for reoccurring indications may be performed as noted.  Right Carotid Findings: +----------+--------+--------+--------+------------------+------------------+             PSV cm/s EDV cm/s Stenosis Plaque Description Comments            +----------+--------+--------+--------+------------------+------------------+  CCA Prox   109      17                                   intimal thickening  +----------+--------+--------+--------+------------------+------------------+  CCA Distal 109      18                                   intimal thickening  +----------+--------+--------+--------+------------------+------------------+  ICA Prox   73       19       1-39%    calcific                               +----------+--------+--------+--------+------------------+------------------+  ICA Distal 39       14                                                       +----------+--------+--------+--------+------------------+------------------+  ECA        109                                                                +----------+--------+--------+--------+------------------+------------------+ +----------+--------+-------+----------------+-------------------+             PSV cm/s EDV cms Describe         Arm Pressure (mmHG)  +----------+--------+-------+----------------+-------------------+  Subclavian 101  Multiphasic, WNL                      +----------+--------+-------+----------------+-------------------+ +---------+--------+--+--------+---------+  Vertebral PSV cm/s 39 EDV cm/s Antegrade  +---------+--------+--+--------+---------+  Left Carotid Findings: +----------+--------+--------+--------+------------------+------------------+             PSV cm/s EDV cm/s Stenosis Plaque Description Comments            +----------+--------+--------+--------+------------------+------------------+  CCA Prox   95       13                                   intimal thickening  +----------+--------+--------+--------+------------------+------------------+  CCA Distal 135      14                                   intimal thickening  +----------+--------+--------+--------+------------------+------------------+  ICA Prox   100      25       1-39%    calcific                               +----------+--------+--------+--------+------------------+------------------+  ICA Distal 65       23                                                       +----------+--------+--------+--------+------------------+------------------+  ECA        126                                                               +----------+--------+--------+--------+------------------+------------------+ +----------+--------+--------+----------------+-------------------+             PSV cm/s EDV cm/s Describe         Arm Pressure (mmHG)  +----------+--------+--------+----------------+-------------------+  Subclavian 126               Multiphasic, WNL                      +----------+--------+--------+----------------+-------------------+  +---------+--------+--+--------+--+---------+  Vertebral PSV cm/s 53 EDV cm/s 18 Antegrade  +---------+--------+--+--------+--+---------+   Summary: Incidental: Enlarged, heterogenous RT thyroid Right Carotid: Velocities in the right ICA are consistent with a 1-39% stenosis. Left Carotid: Velocities in the left ICA are consistent with a 1-39% stenosis. Vertebrals:  Bilateral vertebral arteries demonstrate antegrade flow. Subclavians: Normal flow hemodynamics were seen in bilateral subclavian              arteries. *See table(s) above for measurements and observations.  Electronically signed by Servando Snare MD on 10/07/2019 at 7:42:23 PM.    Final       The results of significant diagnostics from this hospitalization (including imaging, microbiology, ancillary and laboratory) are listed below for reference.     Microbiology: Recent Results (from the past 240 hour(s))  SARS Coronavirus 2 by RT PCR (hospital order, performed in [facility] hospital lab) Nasopharyngeal Nasopharyngeal Swab     Status: None  Collection Time: 10/05/19  8:30 PM   Specimen: Nasopharyngeal Swab  Result Value Ref Range Status   SARS Coronavirus 2 NEGATIVE NEGATIVE Final    Comment: (NOTE) SARS-CoV-2 target nucleic acids are NOT DETECTED.  The SARS-CoV-2 RNA is generally detectable in upper and lower respiratory specimens during the acute phase of infection. The lowest concentration of SARS-CoV-2 viral copies this assay can detect is 250 copies / mL. A negative result does not preclude SARS-CoV-2 infection and should not be used as the sole basis for treatment or other patient management decisions.  A negative result may occur with improper specimen collection / handling, submission of specimen other than nasopharyngeal swab, presence of viral mutation(s) within the areas targeted by this assay, and inadequate number of viral copies (<250 copies / mL). A negative result must be combined with clinical observations,  patient history, and epidemiological information.  Fact Sheet for Patients:   StrictlyIdeas.no  Fact Sheet for Healthcare Providers: BankingDealers.co.za  This test is not yet approved or  cleared by the [place] FDA and has been authorized for detection and/or diagnosis of SARS-CoV-2 by FDA under an Emergency Use Authorization (EUA).  This EUA will remain in effect (meaning this test can be used) for the duration of the COVID-19 declaration under Section 564(b)(1) of the Act, 21 U.S.C. section 360bbb-3(b)(1), unless the authorization is terminated or revoked sooner.  Performed at [facility], [address], [place], [place] [address]      Labs: BNP (last 3 results) No results for input(s): BNP in the last 8760 hours. Basic Metabolic Panel: Recent Labs  Lab 10/05/19 1455 10/08/19 0114 10/11/19 0230  NA 137 138 137  K 4.0 3.4* 3.9  CL 104 105 104  CO2 22 22 22   GLUCOSE 95 101* 103*  BUN 14 8 13   CREATININE 0.93 0.70 0.87  CALCIUM 9.5 9.4 10.1  MG  --   --  1.9   Liver Function Tests: Recent Labs  Lab 10/05/19 1455  AST 21  ALT 16  ALKPHOS 67  BILITOT 0.5  PROT 7.7  ALBUMIN 4.1   No results for input(s): LIPASE, AMYLASE in the last 168 hours. No results for input(s): AMMONIA in the last 168 hours. CBC: Recent Labs  Lab 10/05/19 1455 10/08/19 0114  WBC 8.4 6.9  NEUTROABS 4.5 3.5  HGB 13.4 13.1  HCT 42.2 40.0  MCV 97.5 96.2  PLT 189 182   Cardiac Enzymes: No results for input(s): CKTOTAL, CKMB, CKMBINDEX, TROPONINI in the last 168 hours. BNP: Invalid input(s): POCBNP CBG: No results for input(s): GLUCAP in the last 168 hours. D-Dimer Recent Labs    10/11/19 0230  DDIMER 0.99*   Hgb A1c No results for input(s): HGBA1C in the last 72 hours. Lipid Profile No results for input(s): CHOL, HDL, LDLCALC, TRIG, CHOLHDL, LDLDIRECT in the last 72 hours. Thyroid function studies Recent Labs     10/10/19 0508  TSH 3.576   Anemia work up No results for input(s): VITAMINB12, FOLATE, FERRITIN, TIBC, IRON, RETICCTPCT in the last 72 hours. Urinalysis    Component Value Date/Time   COLORURINE YELLOW 10/05/2019 1600   APPEARANCEUR CLEAR 10/05/2019 1600   LABSPEC 1.010 10/05/2019 1600   PHURINE 5.0 10/05/2019 1600   GLUCOSEU NEGATIVE 10/05/2019 1600   HGBUR NEGATIVE 10/05/2019 1600   BILIRUBINUR NEGATIVE 10/05/2019 1600   KETONESUR NEGATIVE 10/05/2019 1600   PROTEINUR NEGATIVE 10/05/2019 1600   NITRITE NEGATIVE 10/05/2019 1600   LEUKOCYTESUR NEGATIVE 10/05/2019 1600   Sepsis  Labs Invalid input(s): PROCALCITONIN,  WBC,  LACTICIDVEN Microbiology Recent Results (from the past 240 hour(s))  SARS Coronavirus 2 by RT PCR (hospital order, performed in [facility] hospital lab) Nasopharyngeal Nasopharyngeal Swab     Status: None   Collection Time: 10/05/19  8:30 PM   Specimen: Nasopharyngeal Swab  Result Value Ref Range Status   SARS Coronavirus 2 NEGATIVE NEGATIVE Final    Comment: (NOTE) SARS-CoV-2 target nucleic acids are NOT DETECTED.  The SARS-CoV-2 RNA is generally detectable in upper and lower respiratory specimens during the acute phase of infection. The lowest concentration of SARS-CoV-2 viral copies this assay can detect is 250 copies / mL. A negative result does not preclude SARS-CoV-2 infection and should not be used as the sole basis for treatment or other patient management decisions.  A negative result may occur with improper specimen collection / handling, submission of specimen other than nasopharyngeal swab, presence of viral mutation(s) within the areas targeted by this assay, and inadequate number of viral copies (<250 copies / mL). A negative result must be combined with clinical observations, patient history, and epidemiological information.  Fact Sheet for Patients:   StrictlyIdeas.no  Fact Sheet for Healthcare  Providers: BankingDealers.co.za  This test is not yet approved or  cleared by the [place] FDA and has been authorized for detection and/or diagnosis of SARS-CoV-2 by FDA under an Emergency Use Authorization (EUA).  This EUA will remain in effect (meaning this test can be used) for the duration of the COVID-19 declaration under Section 564(b)(1) of the Act, 21 U.S.C. section 360bbb-3(b)(1), unless the authorization is terminated or revoked sooner.  Performed at [facility], [address] [place], [place] [address]      Time coordinating discharge:  I have spent 35 minutes face to face with the patient and on the ward discussing the patients care, assessment, plan and disposition with other care givers. >50% of the time was devoted counseling the patient about the risks and benefits of treatment/Discharge disposition and coordinating care.   SIGNED:   Damita Lack, MD  [facility] 10/12/2019, 10:37 AM   If 7PM-7AM, please contact night-coverage

## 2019-10-12 NOTE — TOC Transition Note (Signed)
Transition of Care [facility]) - CM/SW Discharge Note   Patient Details  Name: Melissa Holt MRN: [id number] Date of Birth: May 11, 1940  [facility] CM/SW Contact:  Pollie Friar, RN Phone Number: 10/12/2019, 3:00 PM   Clinical Narrative:    Pt discharging to CIR today. CM signing off.    Final next level of care: IP Rehab Facility Barriers to Discharge: No Barriers Identified   Patient Goals and CMS Choice        Discharge Placement                       Discharge Plan and Services                                     Social Determinants of Health (SDOH) Interventions     Readmission Risk Interventions No flowsheet data found.

## 2019-10-12 NOTE — Progress Notes (Signed)
Patient admitted to 4w01 via bed transport. Patient educated on call bell and calling for assistance. Denies pain or discomfort. Bed low call bell within reach.

## 2019-10-12 NOTE — Progress Notes (Signed)
Melissa Ribas, MD  Physician  Physical Medicine and Rehabilitation  Consult Note     Signed  Date of Service:  10/09/2019 2:55 PM      Related encounter: ED to Hosp-Admission (Discharged) from 10/05/2019 in [facility]      Signed      Expand All Collapse All  Show:Clear all [x] Manual[x] Template[] Copied  Added by: [x] Love, Ivan Anchors, PA-C[x] Raulkar, Clide Deutscher, MD  [] Hover for details      Physical Medicine and Rehabilitation Consult   Reason for Consult: Stroke with functional deficits.  Referring Physician: Dr. Benny Lennert.    HPI: Melissa Holt is a 79 y.o. female with history of colon cancer, no medical care since colon surgery and has been in relatively good health till fall 1 weeks PTA. She developed bilateral hip pain and then started having difficulty standing due to BLE weakness.  She was found to have to have LLE weakness and transferred to [facility] 10/05/19 for work up and management.  MRI brain negative for acute changes. MRI lumbar and thoracic spine done revealing acute mild endplate T12 fracture without stenosis and L5-S1 anterolisthesis with facet degeneration and subarticular stenosis. MRI pelvis showed normal lumbosacral plexus with mild bilateral hip OA. 2D echo showed EF 60-65% with moderate LVH and mild MVR. Carotid dopplers without significant stenosis and incidental found to have enlarged heterogenous right thyroid.    She started developing progressive symptoms on 8/30 with LUE weakness and mild facial asymmetry. MRI brain done revealing 2 mm acute/early subacute infarct within ventral right medulla, chronic small vessel infarcts and moderate generalized parenchymal atrophy. 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  MRI C spine showed mild canal stenosis  C3/4 and C4/5 and neural foraminal stenosis C4/C7/C8>C6. Therapy ongoing and patient limited by left sided weakness with dysarthria, poor awareness of deficits as well as delayed processing. CIR recommended due to functional deficits.    Review of Systems  Constitutional: Negative for chills and fever.  HENT: Negative for hearing loss and tinnitus.   Eyes: Negative for blurred vision.  Respiratory: Negative for cough and shortness of breath.   Cardiovascular: Negative for chest pain and palpitations.  Gastrointestinal: Negative for constipation and heartburn.  Genitourinary: Negative for dysuria.  Musculoskeletal: Positive for back pain and myalgias.  Neurological: Positive for weakness. Negative for dizziness and headaches.  Psychiatric/Behavioral: Negative for memory loss. The patient does not have insomnia.          Past Medical History:  Diagnosis Date  . Cancer [facility] Fall)          Past Surgical History:  Procedure Laterality Date  . COLON SURGERY           Family History  Problem Relation Age of Onset  . Lung cancer Brother      Social History:  Lives alone--daughter live in neighborhood. 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 She reports that she has never smoked. Her smokeless tobacco use includes snuff. She reports current alcohol use. She reports that she does not use drugs.    Allergies: No Known Allergies    No medications prior to admission.    Home: Home Living Family/patient expects to be discharged to:: Private residence Living Arrangements: Alone Available Help at Discharge: Family, Available PRN/intermittently  Type of Home: Mobile home Home Access: Stairs to enter Entrance Stairs-Number of Steps: 4 Entrance Stairs-Rails: Right, Left Home Layout: One level Bathroom Shower/Tub: Chiropodist: Standard Home Equipment: Environmental consultant - 2 wheels  Functional  History: Prior Function Level of Independence: Needs assistance Gait / Transfers Assistance Needed: Started using walker ~1 week ago ADL's / Homemaking Assistance Needed: typically independent until 1 week PTA, 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 Comments: weakness in LEs with buckling over last several weeks Functional Status:  Mobility: Bed Mobility Overal bed mobility: Needs Assistance Bed Mobility: Rolling, Supine to Sit, Sit to Supine Rolling: Min assist Supine to sit: Mod assist Sit to supine: Mod assist General bed mobility comments: min assist to complete and maintain roll to side, mod assist to raise trunk and for LEs back into bed Transfers Overall transfer level: Needs assistance Equipment used: None Transfer via Lift Equipment: Stedy Transfers: Sit to/from Stand Sit to Stand: Mod assist, +2 safety/equipment General transfer comment: assist to rise using stedy Ambulation/Gait Ambulation/Gait assistance: Min assist, +2 physical assistance General Gait Details: Pt as able to perform pre-gait activity while in [place] including heel raises, weight shifts, toe raises (R while standing, L while sitting)  ADL: ADL Overall ADL's : Needs assistance/impaired Eating/Feeding: Minimal assistance, Sitting Grooming: Minimal assistance, Wash/dry hands, Wash/dry face, Sitting Upper Body Bathing: Moderate assistance, Sitting Lower Body Bathing: Maximal assistance, Sit to/from stand Upper Body Dressing : Moderate assistance, Sitting Lower Body Dressing: Maximal assistance, Sit to/from stand Toilet Transfer: Moderate assistance, Stand-pivot, BSC Toileting- Clothing Manipulation and Hygiene: Total assistance, Sit to/from stand Toileting - Clothing Manipulation Details (indicate cue type and reason): pt with loose stools  Cognition: Cognition Overall Cognitive Status: Impaired/Different from baseline Orientation Level: Oriented X4, Oriented to person, Oriented to place,  Oriented to time, Oriented to situation Cognition Arousal/Alertness: Awake/alert Behavior During Therapy: WFL for tasks assessed/performed Overall Cognitive Status: Impaired/Different from baseline Area of Impairment: Safety/judgement, Awareness, Problem solving Safety/Judgement: Decreased awareness of safety, Decreased awareness of deficits Awareness: Emergent Problem Solving: Requires verbal cues, Slow processing, Difficulty sequencing General Comments: pt denies changes to her speech, aware of weakness in L UE increasing  Blood pressure (!) 155/90, pulse 84, temperature 97.9 F (36.6 C), temperature source Oral, resp. rate (!) 24, height 4\' 11"  (1.499 m), weight 67.6 kg, SpO2 99 %. General: Alert and oriented x 3, No apparent distress HEENT: Head is normocephalic, atraumatic, PERRLA, EOMI, sclera anicteric, oral mucosa pink and moist, dentition intact, ext ear canals clear,  Neck: Supple without JVD or lymphadenopathy Heart: Reg rate and rhythm. No murmurs rubs or gallops Chest: CTA bilaterally without wheezes, rales, or rhonchi; no distress Abdomen: Soft, non-tender, non-distended, bowel sounds positive. Extremities: No clubbing, cyanosis, or edema. Pulses are 2+ Skin: IV in place Neurological:     Mental Status: She is alert and oriented to person, place, and time.     Comments: Left facial droop. Dysarthric speech with occasional wet voice. Able to follow basic commands without difficulty.  LLE 2/5 HF, KE, DF, PF. LUE 4/5 throughout Psychiatric:        Mood and Affect: Mood normal.   Lab Results Last 24 Hours       Results for orders placed or performed during the hospital encounter of 10/05/19 (from the past 24 hour(s))  Hemoglobin A1c     Status: Abnormal   Collection Time: 10/09/19  5:08 AM  Result Value Ref Range   Hgb A1c MFr Bld  5.8 (H) 4.8 - 5.6 %   Mean Plasma Glucose 119.76 mg/dL      Imaging Results (Last 48 hours)  DG Abd 1 View  Result Date:  10/08/2019 CLINICAL DATA:  In-patient, abdominal pain EXAM: ABDOMEN - 1 VIEW COMPARISON:  Thoracic and lumbar spine imaging 10/06/2019 FINDINGS: Paucity of small bowel gas. No high-grade obstructive bowel gas pattern. Air-filled appearance of the colon without large colonic stool burden. Atelectatic changes present in the lung bases. No suspicious calcifications over the urinary tract or abdomen. Cholecystectomy clips in the right upper quadrant. Degenerative changes in the spine hips and pelvis. T12 compression fracture better seen on comparison cross-sectional imaging. No other acute or suspicious osseous abnormalities. Soft tissues are unremarkable. Telemetry leads project over the lower chest and abdomen. IMPRESSION: Air-filled appearance of the colon without large colonic stool burden. No high-grade obstructive bowel gas pattern. Electronically Signed   By: Lovena Le M.D.   On: 10/08/2019 22:29   MR ANGIO HEAD WO CONTRAST  Result Date: 10/08/2019 CLINICAL DATA:  Neuro deficit, acute, stroke suspected. Additional history obtained from [place] reports left arm weakness progressing to leg, unable to lift leg off bed, dysarthric speech, possible mild facial asymmetry. EXAM: MRI HEAD WITHOUT CONTRAST MRA HEAD WITHOUT CONTRAST TECHNIQUE: Multiplanar, multiecho pulse sequences of the brain and surrounding structures were obtained without intravenous contrast. Angiographic images of the head were obtained using MRA technique without contrast. COMPARISON:  Brain MRI 10/05/2019, head CT 10/05/2019 FINDINGS: MRI HEAD FINDINGS Brain: Intermittent motion degradation. Most notably, there is moderate motion degradation of the coronal T2 weighted sequence. There is a 2 mm focus of restricted diffusion within the ventral right medulla consistent with acute/early subacute infarction (series 5, image 57). In retrospect, this was likely present on the prior MRI of 10/05/2019, although poorly  demonstrated on the prior study. Stable, moderate generalized parenchymal atrophy. As before, there are chronic lacunar infarcts within the right corona radiata, basal ganglia, thalami and right middle cerebellar peduncle. Stable background moderate chronic small vessel ischemic changes within the cerebral white matter. No evidence of intracranial mass. No chronic intracranial blood products. No extra-axial fluid collection. No midline shift. Vascular: Reported below. Skull and upper cervical spine: No focal marrow lesion. Sinuses/Orbits: Visualized orbits show no acute finding. Mild ethmoid sinus mucosal thickening. Small left maxillary sinus mucous retention cyst. No significant mastoid effusion. MRA HEAD FINDINGS The intracranial internal carotid arteries. Significant atherosclerotic irregularity of both vessels with multifocal stenoses. Most notably, there is moderate to moderately severe stenosis of the cavernous/paraclinoid right internal carotid artery and up to moderate stenosis of the cavernous left internal carotid artery. There are multiple vascular outpouchings arising from the intracranial internal carotid arteries bilaterally which may reflect aneurysms or vessel lobulation from atherosclerotic disease. This includes a 3 mm inferiorly projecting vascular protrusion arising from the proximal cavernous right ICA (series 1089, image 4), a 1-2 mm inferiorly projecting vascular protrusion arising from the proximal cavernous left ICA (series 1089, image 17). A 2 mm vascular protrusion arising from the supraclinoid right ICA has an appearance most suggestive of an infundibulum, although a small aneurysm is difficult to definitively exclude. Additional vascular protrusions arising from the paraclinoid internal carotid arteries bilaterally appear to reflect the ophthalmic artery origins (for instance as seen on series 8 9, images 4 and 13). The M1 middle cerebral arteries are patent with sites of mild stenosis  bilaterally. Additionally, there is atherosclerotic irregularity of the M2 and more distal MCA  branches bilaterally. However, no M2 proximal branch occlusion or high-grade proximal stenosis is identified. The anterior cerebral arteries are patent. There are moderate/severe focal stenoses at the origins of both A1 segments. The V3 and V4 right vertebral artery is poorly delineated, likely secondary to high-grade stenosis or occlusion. The intracranial left vertebral artery is patent with sites of up to moderate stenosis proximally and distally. 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 Mild atherosclerotic narrowing within the proximal basilar artery. The mid and distal basilar artery is patent without significant stenosis. The posterior cerebral arteries are patent bilaterally with diffuse mild-to-moderate atherosclerotic irregularity. IMPRESSION: MRI brain: 1. Intermittently motion degraded examination. 2. 2 mm acute/early subacute infarct within the ventral right medulla. 3. Redemonstrated chronic small-vessel infarcts within the right corona radiata, bilateral basal ganglia, thalami and right middle cerebellar peduncle. 4. Stable background moderate generalized parenchymal atrophy and chronic small vessel ischemic disease. 5. Mild ethmoid sinus mucosal thickening. 6. Small left maxillary sinus mucous retention cyst. MRA head: 1. Intracranial atherosclerotic disease with multifocal stenoses, as detailed and most notably as follows. 2. The V3 and V4 right vertebral artery is poorly delineated, likely secondary to high-grade stenosis or occlusion. 3. Sites of up to moderate stenosis within the V4 left vertebral artery proximally and distally. 4. Moderate to moderately severe stenosis of the cavernous/paraclinoid right ICA. 5. Moderate stenosis of the cavernous left ICA. 6. Moderate/severe stenoses within the proximal A1 anterior cerebral arteries  bilaterally. 7. 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 8. 3 mm vascular protrusion arising from the proximal cavernous right ICA. 1-2 mm vascular protrusion arising from the proximal cavernous left ICA. These may reflect vessel lobulation from atherosclerotic disease or small aneurysms. 9. 2 mm vascular protrusion arising from the supraclinoid right ICA. This has an appearance most suggestive of an infundibulum, although a small aneurysm at this site is difficult to exclude. Electronically Signed   By: Kellie Simmering DO   On: 10/08/2019 20:43   MR BRAIN WO CONTRAST  Result Date: 10/08/2019 CLINICAL DATA:  Neuro deficit, acute, stroke suspected. Additional history obtained from [place] reports left arm weakness progressing to leg, unable to lift leg off bed, dysarthric speech, possible mild facial asymmetry. EXAM: MRI HEAD WITHOUT CONTRAST MRA HEAD WITHOUT CONTRAST TECHNIQUE: Multiplanar, multiecho pulse sequences of the brain and surrounding structures were obtained without intravenous contrast. Angiographic images of the head were obtained using MRA technique without contrast. COMPARISON:  Brain MRI 10/05/2019, head CT 10/05/2019 FINDINGS: MRI HEAD FINDINGS Brain: Intermittent motion degradation. Most notably, there is moderate motion degradation of the coronal T2 weighted sequence. There is a 2 mm focus of restricted diffusion within the ventral right medulla consistent with acute/early subacute infarction (series 5, image 57). In retrospect, this was likely present on the prior MRI of 10/05/2019, although poorly demonstrated on the prior study. Stable, moderate generalized parenchymal atrophy. As before, there are chronic lacunar infarcts within the right corona radiata, basal ganglia, thalami and right middle cerebellar peduncle. Stable background moderate chronic small vessel ischemic changes within the cerebral white matter. No evidence of intracranial  mass. No chronic intracranial blood products. No extra-axial fluid collection. No midline shift. Vascular: Reported below. Skull and upper cervical spine: No focal marrow lesion. Sinuses/Orbits: Visualized orbits show no acute finding. Mild ethmoid sinus mucosal thickening. Small left maxillary sinus mucous retention cyst. No significant mastoid effusion. MRA HEAD FINDINGS The intracranial internal carotid arteries.  Significant atherosclerotic irregularity of both vessels with multifocal stenoses. Most notably, there is moderate to moderately severe stenosis of the cavernous/paraclinoid right internal carotid artery and up to moderate stenosis of the cavernous left internal carotid artery. There are multiple vascular outpouchings arising from the intracranial internal carotid arteries bilaterally which may reflect aneurysms or vessel lobulation from atherosclerotic disease. This includes a 3 mm inferiorly projecting vascular protrusion arising from the proximal cavernous right ICA (series 1089, image 4), a 1-2 mm inferiorly projecting vascular protrusion arising from the proximal cavernous left ICA (series 1089, image 17). A 2 mm vascular protrusion arising from the supraclinoid right ICA has an appearance most suggestive of an infundibulum, although a small aneurysm is difficult to definitively exclude. Additional vascular protrusions arising from the paraclinoid internal carotid arteries bilaterally appear to reflect the ophthalmic artery origins (for instance as seen on series 8 9, images 4 and 13). The M1 middle cerebral arteries are patent with sites of mild stenosis bilaterally. Additionally, there is atherosclerotic irregularity of the M2 and more distal MCA branches bilaterally. However, no M2 proximal branch occlusion or high-grade proximal stenosis is identified. The anterior cerebral arteries are patent. There are moderate/severe focal stenoses at the origins of both A1 segments. The V3 and V4 right  vertebral artery is poorly delineated, likely secondary to high-grade stenosis or occlusion. The intracranial left vertebral artery is patent with sites of up to moderate stenosis proximally and distally. 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 Mild atherosclerotic narrowing within the proximal basilar artery. The mid and distal basilar artery is patent without significant stenosis. The posterior cerebral arteries are patent bilaterally with diffuse mild-to-moderate atherosclerotic irregularity. IMPRESSION: MRI brain: 1. Intermittently motion degraded examination. 2. 2 mm acute/early subacute infarct within the ventral right medulla. 3. Redemonstrated chronic small-vessel infarcts within the right corona radiata, bilateral basal ganglia, thalami and right middle cerebellar peduncle. 4. Stable background moderate generalized parenchymal atrophy and chronic small vessel ischemic disease. 5. Mild ethmoid sinus mucosal thickening. 6. Small left maxillary sinus mucous retention cyst. MRA head: 1. Intracranial atherosclerotic disease with multifocal stenoses, as detailed and most notably as follows. 2. The V3 and V4 right vertebral artery is poorly delineated, likely secondary to high-grade stenosis or occlusion. 3. Sites of up to moderate stenosis within the V4 left vertebral artery proximally and distally. 4. Moderate to moderately severe stenosis of the cavernous/paraclinoid right ICA. 5. Moderate stenosis of the cavernous left ICA. 6. Moderate/severe stenoses within the proximal A1 anterior cerebral arteries bilaterally. 7. 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 8. 3 mm vascular protrusion arising from the proximal cavernous right ICA. 1-2 mm vascular protrusion arising from the proximal cavernous left ICA. These may reflect vessel lobulation from atherosclerotic disease or small aneurysms. 9. 2 mm vascular protrusion arising from  the supraclinoid right ICA. This has an appearance most suggestive of an infundibulum, although a small aneurysm at this site is difficult to exclude. Electronically Signed   By: Kellie Simmering DO   On: 10/08/2019 20:43   MR CERVICAL SPINE WO CONTRAST  Result Date: 10/08/2019 CLINICAL DATA:  Left upper and lower extremity EXAM: MRI CERVICAL SPINE WITHOUT CONTRAST TECHNIQUE: Multiplanar, multisequence MR imaging of the cervical spine was performed. No intravenous contrast was administered. COMPARISON:  None. FINDINGS: Alignment: Physiologic. Vertebrae: Mildly heterogeneous marrow signal.  Acute abnormality. Cord: Normal Posterior Fossa, vertebral arteries, paraspinal tissues: Abnormal right vertebral artery flow void. Disc  levels: C1-2: Unremarkable. C2-3: Left-greater-than-right facet hypertrophy. Small disc bulge. There is no spinal canal stenosis. No neural foraminal stenosis. C3-4: Intermediate sized disc bulge with bilateral large uncovertebral osteophytes. Mild spinal canal stenosis. Severe bilateral neural foraminal stenosis. C4-5: Right-greater-than-left uncovertebral hypertrophy with intermediate disc bulge. Mild spinal canal stenosis. Severe right and moderate left neural foraminal stenosis. C5-6: Uncovertebral hypertrophy with small disc bulge. There is no spinal canal stenosis. Moderate bilateral neural foraminal stenosis. C6-7: Broad disc bulge with bilateral uncovertebral hypertrophy. There is no spinal canal stenosis. Severe bilateral neural foraminal stenosis. C7-T1: Small disc bulge small bilateral uncovertebral osteophytes. There is no spinal canal stenosis. Severe bilateral neural foraminal stenosis. IMPRESSION: 1. Severe bilateral C4, C7 and C8 neural foraminal stenosis. 2. Mild spinal canal stenosis at C3-4 and C4-5. 3. Moderate bilateral C6 neural foraminal stenosis. Electronically Signed   By: Ulyses Jarred M.D.   On: 10/08/2019 20:20   US THYROID  Result Date: 10/08/2019 CLINICAL  DATA:  Incidental on CT. Goiter with enlarged right lobe of thyroid by CT of the thoracic spine. EXAM: THYROID ULTRASOUND TECHNIQUE: Ultrasound examination of the thyroid gland and adjacent soft tissues was performed. COMPARISON:  CT of the thoracic spine on 10/05/2019 FINDINGS: Parenchymal Echotexture: Moderately heterogenous Isthmus: 0.3 cm Right lobe: 7.3 x 2.9 x 5.4 cm Left lobe: 2.7 x 0.6 x 1.2 cm _________________________________________________________ Estimated total number of nodules >/= 1 cm: 0 Number of spongiform nodules >/=  2 cm not described below (TR1): 0 Number of mixed cystic and solid nodules >/= 1.5 cm not described below (TR2): 0 _________________________________________________________ No discrete nodules are seen within the thyroid gland. The right lobe is enlarged and heterogeneous in appearance without discrete nodule. There are some areas dystrophic shadowing calcification as seen by CT. 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 No focal nodules are identified. The above is in keeping with the ACR TI-RADS recommendations - J Am Coll Radiol 2017;14:587-595. Electronically Signed   By: Aletta Edouard M.D.   On: 10/08/2019 09:52      Assessment/Plan: Diagnosis: Right ventral medullar infarct.  1. Does the need for close, 24 hr/day medical supervision in concert with the patient's rehab needs make it unreasonable for this patient to be served in a less intensive setting? Yes 2. Co-Morbidities requiring supervision/potential complications: HTN, HLD, obesity,  Hypokalemia, ETOH use, severe bilateral C4/C7/C8 neural foraminal stenosis 3. Due to bladder management, bowel management, safety, skin/wound care, disease management, medication administration, pain management and patient education, does the patient require 24 hr/day rehab nursing? Yes 4. Does the patient require coordinated care of a  physician, rehab nurse, therapy disciplines of PT, OT to address physical and functional deficits in the context of the above medical diagnosis(es)? Yes Addressing deficits in the following areas: balance, endurance, locomotion, strength, transferring, bowel/bladder control, bathing, dressing, feeding, grooming, toileting and psychosocial support 5. Can the patient actively participate in an intensive therapy program of at least 3 hrs of therapy per day at least 5 days per week? Yes 6. The potential for patient to make measurable gains while on inpatient rehab is excellent 7. Anticipated functional outcomes upon discharge from inpatient rehab are modified independent  with PT, modified independent with OT, independent with SLP. 8. Estimated rehab length of stay to reach the above functional goals is: 10-14 days 9. Anticipated discharge destination: Home 10. Overall Rehab/Functional Prognosis: excellent  RECOMMENDATIONS: This patient's condition is appropriate for continued rehabilitative care  in the following setting: CIR Patient has agreed to participate in recommended program. Yes Note that insurance prior authorization may be required for reimbursement for recommended care.  Comment: Mrs. Sowder would be an excellent CIR candidate. She lives alone and has [place], PA-C 10/09/2019   I have personally performed a face to face diagnostic evaluation, including, but not limited to relevant history and physical exam findings, of this patient and developed relevant assessment and plan.  Additionally, I have reviewed and concur with the physician assistant's documentation above.  Leeroy Cha, MD        Revision History                Routing History                Note Details  Author Ranell Patrick, Clide Deutscher, MD File Time 10/10/2019 12:31 PM  Author Type Physician Status Signed  Last Editor Melissa Ribas, MD Service [facility] # [id number] Admit Date 10/12/2019

## 2019-10-12 NOTE — Progress Notes (Signed)
Inpatient Rehabilitation Admissions Coordinator  I have insurance approval and Cir bed today. I met with patient at bedside and she is aware. I have contacted Dr. Reesa Chew , acute team and TOC. I will make the arrangements to admit today.  Danne Baxter, RN, MSN Rehab Admissions Coordinator [phone] 10/12/2019 3:06 PM

## 2019-10-12 NOTE — Progress Notes (Addendum)
Inpatient Rehabilitation Medication Review by a Pharmacist  A complete drug regimen review was completed for this patient to identify any potential clinically significant medication issues.  Clinically significant medication issues were identified:  yes   Type of Medication Issue Identified Description of Issue Urgent (address now) Non-Urgent (address on AM team rounds) Plan Plan Accepted by Provider? (Yes / No / Pending AM Rounds)                                Additional Drug Therapy Needed  Senna and lactulose not ordered upon transfer Non-urgent Address with team in AM Pending AM rounds  Other         Name of provider notified for urgent issues identified: Message team in AM  Provider Method of Notification: Secure chat   For non-urgent medication issues to be resolved on team rounds tomorrow morning a [facility] Secure Chat Handoff was sent to:    Pharmacist comments:   Time spent performing this drug regimen review (minutes):  [place] 10/12/2019 8:00 PM

## 2019-10-13 ENCOUNTER — Inpatient Hospital Stay (HOSPITAL_COMMUNITY): Payer: Medicare Other

## 2019-10-13 ENCOUNTER — Inpatient Hospital Stay (HOSPITAL_COMMUNITY): Payer: Medicare Other | Admitting: Speech Pathology

## 2019-10-13 DIAGNOSIS — I6350 Cerebral infarction due to unspecified occlusion or stenosis of unspecified cerebral artery: Secondary | ICD-10-CM

## 2019-10-13 LAB — CBC WITH DIFFERENTIAL/PLATELET
Abs Immature Granulocytes: 0.04 10*3/uL (ref 0.00–0.07)
Basophils Absolute: 0 10*3/uL (ref 0.0–0.1)
Basophils Relative: 0 %
Eosinophils Absolute: 0 10*3/uL (ref 0.0–0.5)
Eosinophils Relative: 0 %
HCT: 43.7 % (ref 36.0–46.0)
Hemoglobin: 14.1 g/dL (ref 12.0–15.0)
Immature Granulocytes: 0 %
Lymphocytes Relative: 12 %
Lymphs Abs: 1.6 10*3/uL (ref 0.7–4.0)
MCH: 30.9 pg (ref 26.0–34.0)
MCHC: 32.3 g/dL (ref 30.0–36.0)
MCV: 95.6 fL (ref 80.0–100.0)
Monocytes Absolute: 0.7 10*3/uL (ref 0.1–1.0)
Monocytes Relative: 5 %
Neutro Abs: 11.5 10*3/uL — ABNORMAL HIGH (ref 1.7–7.7)
Neutrophils Relative %: 83 %
Platelets: 215 10*3/uL (ref 150–400)
RBC: 4.57 MIL/uL (ref 3.87–5.11)
RDW: 12.5 % (ref 11.5–15.5)
WBC: 13.9 10*3/uL — ABNORMAL HIGH (ref 4.0–10.5)
nRBC: 0 % (ref 0.0–0.2)

## 2019-10-13 LAB — COMPREHENSIVE METABOLIC PANEL
ALT: 24 U/L (ref 0–44)
AST: 24 U/L (ref 15–41)
Albumin: 3.5 g/dL (ref 3.5–5.0)
Alkaline Phosphatase: 73 U/L (ref 38–126)
Anion gap: 10 (ref 5–15)
BUN: 27 mg/dL — ABNORMAL HIGH (ref 8–23)
CO2: 22 mmol/L (ref 22–32)
Calcium: 10.1 mg/dL (ref 8.9–10.3)
Chloride: 105 mmol/L (ref 98–111)
Creatinine, Ser: 0.94 mg/dL (ref 0.44–1.00)
GFR calc Af Amer: >60 mL/min (ref >60)
GFR calc non Af Amer: 58 mL/min — ABNORMAL LOW (ref >60)
Glucose, Bld: 102 mg/dL — ABNORMAL HIGH (ref 70–99)
Potassium: 4 mmol/L (ref 3.5–5.1)
Sodium: 137 mmol/L (ref 135–145)
Total Bilirubin: 0.4 mg/dL (ref 0.3–1.2)
Total Protein: 7.6 g/dL (ref 6.5–8.1)

## 2019-10-13 LAB — GLUCOSE, CAPILLARY
Glucose-Capillary: 124 mg/dL — ABNORMAL HIGH (ref 70–99)
Glucose-Capillary: 107 mg/dL — ABNORMAL HIGH (ref 70–99)
Glucose-Capillary: 123 mg/dL — ABNORMAL HIGH (ref 70–99)
Glucose-Capillary: 157 mg/dL — ABNORMAL HIGH (ref 70–99)

## 2019-10-13 MED ORDER — PANTOPRAZOLE SODIUM 40 MG PO TBEC
40.0000 mg | DELAYED_RELEASE_TABLET | Freq: Every day | ORAL | Status: DC
  Administered 2019-10-13 – 2019-10-26 (×14): 40 mg via ORAL
  Filled 2019-10-13 (×14): qty 1

## 2019-10-13 NOTE — Evaluation (Signed)
Speech Language Pathology Assessment and Plan  Patient Details  Name: Melissa Holt MRN: [id number] Date of Birth: 05-29-1940  SLP Diagnosis: Cognitive Impairments;Dysphagia  Rehab Potential: Excellent ELOS: 10-14 days    Today's Date: 10/13/2019 SLP Individual Time: 9562-1308 SLP Individual Time Calculation (min): 7 min   Hospital Problem: Principal Problem:   Stroke [facility])  Past Medical History:  Past Medical History:  Diagnosis Date  . Cancer [facility])    Past Surgical History:  Past Surgical History:  Procedure Laterality Date  . COLON SURGERY      Assessment / Plan / Recommendation Clinical Impression Patient is a 79 year old female with history of colon cancer--no medical care since colon surgery and had been in relatively good health till fall 1 week prior to admission. She developed bilateral hip pain then started having difficulty standing due to BLE weakness. She was found to have LLE weakness and was transferred to [facility] on 10/05/2019 for work-up and management. MRI brain was negative for acute changes. MRI lumbar and thoracic spine revealed acute mild endplate T12 fracture without stenosis and L5-S1 anterolisthesis with subarticular stenosis. MRI pelvis showed normal lumbosacral plexus with mild bilateral hip OA. 2D echo showed EF of 60 to 65% with moderate LVH and mild MVR. Carotid Dopplers were negative for ICA stenosis and showed incidental line large heterogeneous right thyroid. She started developing progressive symptoms on 08/30 with LUE weakness and mild facial asymmetry. MRI brain done revealing 2 mm acute/early subacute infarct within right ventral medulla, chronic small vessel infarcts and moderate generalized parenchymal atrophy. 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 MRI C-spine showed mild  canal stenosis C3-C4 and C4-C5 and neural foraminal stenosis. Dr. Leonie Man felt that stroke secondary to small vessel disease with resultant LLE weakness and recommended DAPT x3 weeks followed by Plavix alone. Outpatient follow-up recommended for small cerebral aneurysms. Dr. Marcello Moores evaluated spine films and felt that no structural spine disease responsible for leg weakness and to follow-up on outpatient basis for nonsurgical therapies. 5-day course of prednisone recommended by NS to help manage symptoms. She continues to have deficits with mobility and ADL tasks. CIR recommended due to functional decline and patient admitted 10/12/19.  Patient was administered the [place] Status ([place]) Examination and scored 22/30 points indicating a mild neurological impairment. Patient demonstrated deficits in problem solving and immediate recall. Patient noted to have decreased speech intelligibility which patient reports is baseline, suspect due to missing dentition and dialectal difference. Patient also demonstrated an intermittent wet vocal quality with reports of intermittent coughing when eating and drinking, therefore, a BSE was administered. Patient consumed large, sequential sips of thin liquids via straw and multiple whole pills with thin liquids without overt s/s of aspiration.  Despite missing dentition in poor condition, patient with efficient mastication of regular textures with very minimal oral residue. Recommend patient continue current diet of regular textures with thin liquids with brief f/u for tolerance. Suspect delayed wet vocal quality could be due to reflux as it was noted again after trials and medications that patient independently cleared. Educated patient importance of sitting upright and remaining upright for at least 30 minutes of meals, she verbalized understanding. Patient would benefit from skilled SLP intervention to maximize her cognitive functioning and overall functional  independence prior to discharge.    Skilled Therapeutic Interventions  Administered a cognitive-linguistic evaluation and BSE, please see above for details.   SLP Assessment  Patient will need skilled Speech Lanaguage Pathology Services during CIR admission    Recommendations  SLP Diet Recommendations: Age appropriate regular solids;Thin Liquid Administration via: Cup;Straw Medication Administration: Whole meds with liquid Supervision: Patient able to self feed Compensations: Minimize environmental distractions;Slow rate;Small sips/bites Postural Changes and/or Swallow Maneuvers: Seated upright 90 degrees Oral Care Recommendations: Oral care BID Recommendations for Other Services: Neuropsych consult Patient destination: Home Follow up Recommendations: Home Health SLP Equipment Recommended: None recommended by SLP    SLP Frequency 3 to 5 out of 7 days   SLP Duration  SLP Intensity  SLP Treatment/Interventions 10-14 days  Minumum of 1-2 x/day, 30 to 90 minutes  Cognitive remediation/compensation;Dysphagia/aspiration precaution training;Internal/external aids;Therapeutic Activities;Environmental controls;Cueing hierarchy;Functional tasks;Patient/family education    Pain Pain Assessment Pain Scale: 0-10 Pain Score: 0-No pain   SLP Evaluation Cognition Overall Cognitive Status: Impaired/Different from baseline Arousal/Alertness: Awake/alert Orientation Level: Oriented X4 Attention: Selective Selective Attention: Appears intact Selective Attention Impairment: Verbal basic;Functional basic Memory: Impaired Memory Impairment: Decreased recall of new information;Decreased short term memory Decreased Short Term Memory: Functional basic Awareness: Impaired Awareness Impairment: Emergent impairment Problem Solving: Impaired Problem Solving Impairment: Functional complex;Verbal complex Safety/Judgment: Impaired  Comprehension Auditory Comprehension Overall Auditory  Comprehension: Appears within functional limits for tasks assessed Visual Recognition/Discrimination Discrimination: Not tested Reading Comprehension Reading Status: Not tested Expression Expression Primary Mode of Expression: Verbal Verbal Expression Overall Verbal Expression: Appears within functional limits for tasks assessed Written Expression Dominant Hand: Right Written Expression: Within Functional Limits Oral Motor Oral Motor/Sensory Function Overall Oral Motor/Sensory Function: Within functional limits Motor Speech Overall Motor Speech: Appears within functional limits for tasks assessed (intelligibility is decreased but suspect due to missing dentition and dialect)  Care Tool Care Tool Cognition Expression of Ideas and Wants Expression of Ideas and Wants: Some difficulty - exhibits some difficulty with expressing needs and ideas (e.g, some words or finishing thoughts) or speech is not clear   Understanding Verbal and Non-Verbal Content Understanding Verbal and Non-Verbal Content: Usually understands - understands most conversations, but misses some part/intent of message. Requires cues at times to understand   Memory/Recall Ability *first 3 days only Memory/Recall Ability *first 3 days only: Current season;That he or she is in a hospital/hospital unit     Bedside Swallowing Assessment General Date of Onset: 10/05/19 Previous Swallow Assessment: N/A Diet Prior to this Study: Regular;Thin liquids Temperature Spikes Noted: No Respiratory Status: Room air History of Recent Intubation: No Behavior/Cognition: Alert;Cooperative Oral Cavity - Dentition: Poor condition;Missing dentition Self-Feeding Abilities: Able to feed self Patient Positioning: Upright in bed Baseline Vocal Quality: Normal Volitional Cough: Strong Volitional Swallow: Able to elicit  Ice Chips Ice chips: Not tested Thin Liquid Thin Liquid: Within functional limits Presentation: Straw;Self  Fed Nectar Thick Nectar Thick Liquid: Not tested Honey Thick Honey Thick Liquid: Not tested Puree Puree: Within functional limits Presentation: Self Fed;Spoon Solid Solid: Within functional limits Presentation: Self Fed;Spoon BSE Assessment Suspected Esophageal Findings Suspected Esophageal Findings: Belching  Short Term Goals: Week 1: SLP Short Term Goal 1 (Week 1): Patient will consume current diet without overt s/s of aspiration with Mod I for use of swallowing compensatory strategies. SLP Short Term Goal 2 (Week 1): Patient will demonstrate functional problem solving for mildly complex tasks with supervision verbal cues. SLP Short Term Goal 3 (Week 1): Patient will recall new, daily information with Min A verbal and visual cues.  Refer  to Care Plan for Long Term Goals  Recommendations for other services: Neuropsych  Discharge Criteria: Patient will be discharged from SLP if patient refuses treatment 3 consecutive times without medical reason, if treatment goals not met, if there is a change in medical status, if patient makes no progress towards goals or if patient is discharged from hospital.  The above assessment, treatment plan, treatment alternatives and goals were discussed and mutually agreed upon: by patient  Jewelia Bocchino 10/13/2019, 9:42 AM

## 2019-10-13 NOTE — Evaluation (Signed)
Occupational Therapy Assessment and Plan  Patient Details  Name: Melissa Holt MRN: [id number] Date of Birth: Jan 25, 1941  OT Diagnosis: hemiplegia affecting non-dominant side and muscle weakness (generalized) Rehab Potential: Rehab Potential (ACUTE ONLY): Good ELOS: 10-14 days   Today's Date: 10/13/2019 OT Individual Time: 1062-6948 OT Individual Time Calculation (min): 75 min     Hospital Problem: Principal Problem:   Stroke [facility])   Past Medical History:  Past Medical History:  Diagnosis Date  . Cancer [facility])    Past Surgical History:  Past Surgical History:  Procedure Laterality Date  . COLON SURGERY      Assessment & Plan Clinical Impression: Melissa Holt a 79 y.o.femalewith history of colon cancer, no medical care since colon surgery and has been in relatively good health till fall 1 weeks PTA. She developed bilateral hip pain and then started having difficulty standing due to BLE weakness. She was found to have to have LLE weakness and [facility] and[facility]. MRI brain negative for acute changes. MRI lumbar and thoracic spine done revealing acute mild endplate T12 fracture without stenosis and L5-S1 anterolisthesis with facet degeneration and subarticular stenosis. MRI pelvis showed normal lumbosacral plexus with mild bilateral hip OA. 2D echo showed EF 60-65% with moderate LVH and mild MVR. Carotid dopplers without significant stenosis and incidental found to have enlarged heterogenous right thyroid.   She started developing progressive symptoms on 8/30 with LUE weakness and mild facial asymmetry. MRI brain done revealing 2 mm acute/early subacute infarct within ventral right medulla, chronic small vessel infarcts and moderate generalized parenchymal atrophy. 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 MRI C spine showed mild canal stenosis C3/4 and C4/5 and neural foraminal stenosis C4/C7/C8>C6. Therapy ongoing and patient limited by left sided weakness with dysarthria, poor awareness of deficits as well as delayed processing.  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 In her thoracic spine, there is a mild acute T12 compression fracture. In her lumbar spine, there is multilevel degenerative disease with moderate foraminal stenosis but no significant neural element impingement.Dr Marcello Moores saw patient and didnot see any structural spine disease that would be responsible for any leg weakness, acute or otherwise.Melissa Holt has lumbar radiculopathic/sciatica symptoms, she can f/u in neurosurgery clinic but nonsurgical therapies and possibly injections would be recommended.  Patient transferred to [facility] on 10/12/2019 .    Patient currently requires mod-max assist with basic self-care skills secondary to muscle weakness, decreased cardiorespiratoy endurance, decreased coordination and decreased motor planning, decreased problem solving, decreased memory and delayed processing and decreased standing balance, decreased postural control, hemiplegia and decreased balance strategies.  Prior to hospitalization, patient could complete BADLs with independent .  Patient will benefit from skilled intervention to increase independence with basic self-care skills prior to discharge home independently.  Anticipate patient will require intermittent supervision and follow up home health.  OT - End of Session Activity Tolerance: Decreased this session Endurance Deficit: Yes OT Assessment Rehab Potential (ACUTE ONLY): Good OT Barriers to Discharge: Decreased caregiver support OT Patient demonstrates impairments in the following area(s):  Balance;Perception;Safety;Sensory;Behavior;Cognition;Endurance;Motor OT Basic ADL's Functional Problem(s): Grooming;Bathing;Dressing;Toileting OT Advanced ADL's Functional Problem(s): Simple Meal Preparation OT Transfers Functional Problem(s): Toilet OT Additional Impairment(s): Fuctional Use of Upper Extremity OT Plan OT Intensity: Minimum of 1-2 x/day, 45 to  90 minutes OT Frequency: 5 out of 7 days OT Duration/Estimated Length of Stay: 10-14 days OT Treatment/Interventions: Balance/vestibular training;Discharge planning;Functional electrical stimulation;Pain management;Therapeutic Activities;Self Care/advanced ADL retraining;UE/LE Coordination activities;Cognitive remediation/compensation;Disease mangement/prevention;Functional mobility training;Patient/family education;Skin care/wound managment;Therapeutic Exercise;Visual/perceptual remediation/compensation;Community reintegration;DME/adaptive equipment instruction;Neuromuscular re-education;Psychosocial support;Splinting/orthotics;UE/LE Strength taining/ROM;Wheelchair propulsion/positioning OT Self Feeding Anticipated Outcome(s): n/a OT Basic Self-Care Anticipated Outcome(s): Mod I OT Toileting Anticipated Outcome(s): Mod I OT Bathroom Transfers Anticipated Outcome(s): Mod I OT Recommendation Patient destination: Home Follow Up Recommendations: Home health OT Equipment Recommended: To be determined   OT Evaluation Precautions/Restrictions  Precautions Precautions: Back Restrictions Weight Bearing Restrictions: No General   Vital Signs   Pain Pain Assessment Pain Scale: 0-10 Pain Score: 0-No pain Home Living/Prior Functioning Home Living Family/patient expects to be discharged to:: Private residence Living Arrangements: Alone Available Help at Discharge: Family, Friend(s), Available PRN/intermittently Type of Home: Mobile home Home Access: Stairs to enter Technical brewer of Steps: 4 Entrance Stairs-Rails: Can reach  both, Left, Right Home Layout: One level Bathroom Shower/Tub: Chiropodist: Standard  Lives With: Alone Prior Function Level of Independence: Independent with basic ADLs, Independent with homemaking with ambulation, Independent with gait, Independent with transfers  Able to Take Stairs?: Yes Driving: Yes Vocation: Full time employment Vocation Requirements: full time seasonal work, works in [employer]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) Leisure: Hobbies-yes (Comment) Comments: watch TV, cooking Vision Baseline Vision/History: No visual deficits Patient Visual Report: No change from baseline Vision Assessment?: No apparent visual deficits Perception  Perception: Within Functional Limits Praxis Praxis: Intact Cognition Overall Cognitive Status: Impaired/Different from baseline Arousal/Alertness: Awake/alert Orientation Level: Person;Place;Situation Person: Oriented Place: Oriented Situation: Oriented Year: 2021 Month: September Day of Week: Correct Memory: Impaired Memory Impairment: Decreased recall of new information Decreased Short Term Memory: Functional basic Immediate Memory Recall: Sock;Blue;Bed Memory Recall Sock: Without Cue Memory Recall Blue: Not able to recall Memory Recall Bed: With Cue Attention: Sustained Sustained Attention: Appears intact Selective Attention: Appears intact Selective Attention Impairment: Verbal basic;Functional basic Awareness: Impaired Awareness Impairment: Emergent impairment Problem Solving: Impaired Problem Solving Impairment: Functional complex;Verbal complex Safety/Judgment: Impaired Sensation Sensation Light Touch: Appears Intact Hot/Cold: Appears Intact Proprioception: Appears Intact Coordination Gross Motor Movements are Fluid and Coordinated: No Fine Motor Movements are Fluid and Coordinated: No Coordination and Movement Description: LLE weakness Finger Nose Finger Test: LUE weakness Motor  Motor Motor:  Hemiplegia  Trunk/Postural Assessment  Lumbar Assessment Lumbar Assessment: Exceptions to WFL (kyphotic) Postural Control Postural Control: Deficits on evaluation  Balance Balance Balance Assessed: Yes Static Sitting Balance Static Sitting - Balance Support: Bilateral upper extremity supported;Feet supported Static Sitting - Level of Assistance: 5: Stand by assistance Dynamic Sitting Balance Dynamic Sitting - Balance Support: During functional activity;Feet supported Dynamic Sitting - Level of Assistance: 4: Min assist Dynamic Sitting - Balance Activities: Forward lean/weight shifting Static Standing Balance Static Standing - Balance Support: Bilateral upper extremity supported Static Standing - Level of Assistance: 5: Stand by assistance;4: Min assist Dynamic Standing Balance Dynamic Standing - Balance Support: During functional activity Dynamic Standing - Level of Assistance: 4: Min assist Dynamic Standing - Balance Activities: Lateral lean/weight shifting;Forward lean/weight shifting Extremity/Trunk Assessment RUE Assessment RUE Assessment: Within Functional Limits General Strength Comments: 4+/5 LUE Assessment LUE Assessment: Exceptions to [facility] Active Range of Motion (AROM) Comments: inconsistently demonstrated shoulder flexion >100 degrees; incresaed ROM observed during functional tasks vs on command General Strength Comments: 3+/5  Care Tool Care Tool Self Care Eating        Oral Care  Bathing   Body parts bathed by patient: Left arm;Chest;Abdomen;Front perineal area;Right upper leg;Left upper leg;Face Body parts bathed by helper: Right lower leg;Left lower leg;Right arm;Buttocks   Assist Level: Minimal Assistance - Patient > 75%    Upper Body Dressing(including orthotics)   What is the patient wearing?: Pull over shirt   Assist Level: Moderate Assistance - Patient 50 - 74%    Lower Body Dressing (excluding footwear)   What is the patient wearing?:  Underwear/pull up;Pants Assist for lower body dressing: Maximal Assistance - Patient 25 - 49%    Putting on/Taking off footwear   What is the patient wearing?: Non-skid slipper socks Assist for footwear: Total Assistance - Patient < 25%       Care Tool Toileting Toileting activity   Assist for toileting: Minimal Assistance - Patient > 75%     Care Tool Bed Mobility Roll left and right activity        Sit to lying activity        Lying to sitting edge of bed activity   Lying to sitting edge of bed assist level: Minimal Assistance - Patient > 75%     Care Tool Transfers Sit to stand transfer   Sit to stand assist level: Minimal Assistance - Patient > 75%    Chair/bed transfer   Chair/bed transfer assist level: Minimal Assistance - Patient > 75%     Toilet transfer   Assist Level: Minimal Assistance - Patient > 75%     Care Tool Cognition Expression of Ideas and Wants Expression of Ideas and Wants: Some difficulty - exhibits some difficulty with expressing needs and ideas (e.g, some words or finishing thoughts) or speech is not clear   Understanding Verbal and Non-Verbal Content Understanding Verbal and Non-Verbal Content: Usually understands - understands most conversations, but misses some part/intent of message. Requires cues at times to understand   Memory/Recall Ability *first 3 days only Memory/Recall Ability *first 3 days only: Current season;That he or she is in a hospital/hospital unit    Refer to Care Plan for [place] 1 OT Short Term Goal 1 (Week 1): Pt will complete LB dressing using AE, prn, with min assist. OT Short Term Goal 2 (Week 1): Pt will complete toilet transfer with supervision and no more than min cues for sequencing/safety. OT Short Term Goal 3 (Week 1): Pt will complete bathing with supervision and no more than min cues for sequencing/safety.  Recommendations for other services: None    Skilled Therapeutic  Intervention OT session focused on education, ADL retraining, functional transfers, activity tolerance, and balance. Educated on role of OT, goals of therapy, ELOS, possible DME, safety plan, and precautions. Pt completed bathing and dressing sit<>stand from w/c with mod-max assist due to precautions and L sided weakness. Pt completed sit<>stand and stand pivot transfers with min A using RW. Pt demonstrated decreased core strength impacting posture while sitting in chair and sitting EOB. Of note, pt demonstrated increased LUE ROM when engaging in functional self-care tasks. At end of session, pt left sitting in w/c with safety belt and call light in reacher.   ADL   Mobility  Bed Mobility Bed Mobility: Supine to Sit Supine to Sit: Minimal Assistance - Patient > 75% Transfers Sit to Stand: Minimal Assistance - Patient > 75% Stand to Sit: Minimal Assistance - Patient > 75%   Discharge Criteria: Patient will be discharged from OT if patient refuses treatment 3 consecutive times without  medical reason, if treatment goals not met, if there is a change in medical status, if patient makes no progress towards goals or if patient is discharged from hospital.  The above assessment, treatment plan, treatment alternatives and goals were discussed and mutually agreed upon: by patient  Duayne Cal 10/13/2019, 2:28 PM

## 2019-10-13 NOTE — Progress Notes (Signed)
Patient sitting in chair this morning denies any pain. Patient needs encouragement to eat will monitor intake.

## 2019-10-13 NOTE — Evaluation (Signed)
Physical Therapy Assessment and Plan  Patient Details  Name: Melissa Holt MRN: [id number] Date of Birth: 1940-11-06  PT Diagnosis: Abnormal posture, Abnormality of gait, Cognitive deficits, Coordination disorder, Difficulty walking, Hemiparesis non-dominant, Impaired cognition and Muscle weakness, pain in back Rehab Potential: Good ELOS: 1.5-2 weeks   Today's Date: 10/13/2019 PT Individual Time: 1300-1401 PT Individual Time Calculation (min): 61 min    Hospital Problem: Principal Problem:   Stroke [facility])   Past Medical History:  Past Medical History:  Diagnosis Date  . Cancer [facility])    Past Surgical History:  Past Surgical History:  Procedure Laterality Date  . COLON SURGERY      Assessment & Plan Clinical Impression: Patient is a 68 female with history of colon cancer--no medical care since colon surgery and had been in relatively good health till fall 1 week prior to admission. She developed bilateral hip pain then started having difficulty standing due to BLE weakness. She was found to have LLE weakness and was transferred to [facility] on 10/05/2019 for work-up and management. MRI brain was negative for acute changes. MRI lumbar and thoracic spine revealed acute mild endplate T12 fracture without stenosis and L5-S1 anterolisthesis with subarticular stenosis. MRI pelvis showed normal lumbosacral plexus with mild bilateral hip OA. 2D echo showed EF of 60 to 65% with moderate LVH and mild MVR. Carotid Dopplers were negative for ICA stenosis and showed incidental line large heterogeneous right thyroid.  She started developing progressive symptoms on 08/30 with LUE weakness and mild facial asymmetry. MRI brain done revealing 2 mm acute/early subacute infarct within right ventral medulla, chronic small vessel infarcts and moderate generalized parenchymal atrophy. 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 MRI C-spine showed mild canal stenosis C3-C4 and C4-C5 and neural foraminal stenosis. Dr. Leonie Man felt that stroke secondary to small vessel disease with resultant LLE weakness and recommended DAPT x3 weeks followed by Plavix alone. Outpatient follow-up recommended for small cerebral aneurysms. Dr. Marcello Moores evaluated spine films and felt that no structural spine disease responsible for leg weakness and to follow-up on outpatient basis for nonsurgical therapies. 5-day course of prednisone recommended by NS to help manage symptoms. She continues to have deficits with mobility and ADL tasks. CIR recommended due to functional decline.  Patient transferred to CIR on 10/12/2019 .   Patient currently requires mod with mobility secondary to muscle weakness, decreased cardiorespiratoy endurance, decreased coordination, decreased awareness, decreased problem solving and decreased safety awareness, and decreased standing balance, decreased postural control, decreased balance strategies and L hemiparesis.  Prior to hospitalization, patient was independent  with mobility and lived with Alone in a Mobile home home.  Home access is 4Stairs to enter.  Patient will benefit from skilled PT intervention to maximize safe functional mobility, minimize fall risk and decrease caregiver burden for planned discharge home with 24 hour supervision.  Anticipate patient will benefit from follow up [place] at discharge.  PT - End of Session Activity Tolerance: Tolerates 30+ min activity with multiple rests Endurance Deficit: Yes Endurance Deficit Description: 2/2 generalized weakness/deconditioning PT Assessment Rehab Potential (ACUTE/IP ONLY): Fair PT Barriers to Discharge: Lack of/limited family support PT Barriers to Discharge Comments: unsure if family can provide supervision at d/c PT Patient demonstrates impairments in the following area(s):  Balance;Behavior;Safety;Endurance;Motor;Pain PT Transfers Functional Problem(s): Bed Mobility;Bed to Chair;Car;Furniture PT Locomotion Functional Problem(s): Ambulation;Wheelchair  Mobility;Stairs PT Plan PT Intensity: Minimum of 1-2 x/day ,45 to 90 minutes PT Frequency: 5 out of 7 days PT Duration Estimated Length of Stay: 1.5-2 weeks PT Treatment/Interventions: Ambulation/gait training;Cognitive remediation/compensation;Discharge planning;DME/adaptive equipment instruction;Functional mobility training;Pain management;Psychosocial support;Splinting/orthotics;Therapeutic Activities;UE/LE Strength taining/ROM;UE/LE Coordination activities;Wheelchair propulsion/positioning;Therapeutic Exercise;Stair training;Skin care/wound management;Patient/family education;Neuromuscular re-education;Disease management/prevention;Functional electrical stimulation;Community reintegration;Balance/vestibular training PT Transfers Anticipated Outcome(s): supervision with LRAD PT Locomotion Anticipated Outcome(s): supervision with LRAD PT Recommendation Recommendations for Other Services: Speech consult;Therapeutic Recreation consult Therapeutic Recreation Interventions: Kitchen group;Stress management Follow Up Recommendations: 24 hour supervision/assistance (HHPT vs OPPT) Patient destination: Home Equipment Recommended: To be determined   PT Evaluation Precautions/Restrictions Precautions Precautions: Fall;Back Restrictions Weight Bearing Restrictions: No  General Chart Reviewed: Yes Additional Pertinent History: colon cancer Response to Previous Treatment: Patient with no complaints from previous session. Family/Caregiver Present: No  Pain "I have a little pain in my back when I lay down" from "shoulder blade to shoulder blade" "but other than that nothing" - rest breaks provided PRN during session  Home Living/Prior Functioning Home Living Available Help at Discharge: Family;Available  PRN/intermittently (daughter lives 3 minutes away) Type of Home: Mobile home Home Access: Stairs to enter [employer] of Steps: 4 Entrance Stairs-Rails: Right;Left;Can reach both Home Layout: One level  Lives With: Alone Prior Function Level of Independence: Independent with transfers;Independent with gait;Independent with basic ADLs  Able to Take Stairs?: Yes Driving: Yes Vocation: Full time employment Vocation Requirements: full time seasonal work, 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  Pt denies changes in vision.  Cognition Overall Cognitive Status: Impaired/Different from baseline Arousal/Alertness: Awake/alert Orientation Level: Oriented X4 Attention: Selective Selective Attention: Appears intact Selective Attention Impairment: Verbal basic;Functional basic Memory: Impaired Memory Impairment: Decreased recall of new information;Decreased short term memory Decreased Short Term Memory: Functional basic Awareness: Impaired  Awareness Impairment: Emergent impairment Problem Solving: Impaired Problem Solving Impairment: Functional complex;Verbal complex Safety/Judgment: Impaired  Sensation Sensation Light Touch: Appears Intact Proprioception: Appears Intact Coordination Gross Motor Movements are Fluid and Coordinated: No Fine Motor Movements are Fluid and Coordinated: No Coordination and Movement Description: LLE weakness Finger Nose Finger Test: LUE weakness   Motor  Motor Motor: Abnormal postural alignment and control Motor - Skilled Clinical Observations: L hemiparesis, generalized weakness   Trunk/Postural Assessment  Thoracic Assessment Thoracic Assessment: Exceptions to [facility] (rounded shoulders) Lumbar Assessment Lumbar Assessment: Exceptions to [facility] (posterior pelvic tilt) Postural Control Postural Control:  Deficits on evaluation Righting Reactions: delayed Protective Responses: delayed   Balance Balance Balance Assessed: Yes Static Sitting Balance Static Sitting - Balance Support: Bilateral upper extremity supported;Feet supported Static Sitting - Level of Assistance: 5: Stand by assistance  Dynamic Standing Balance Dynamic Standing - Balance Support: During functional activity Dynamic Standing - Level of Assistance: 4: Min assist/mod assist  Dynamic Standing - Balance Activities: gait with RW  Extremity Assessment  RUE Assessment RUE Assessment: Within Functional Limits General Strength Comments: 4+/5 LUE Assessment (per OT assessment) LUE Assessment: Exceptions to [facility] Active Range of Motion (AROM) Comments: inconsistently demonstrated shoulder flexion >100 degrees; incresaed ROM observed during functional tasks vs on command General Strength Comments: 3+/5   RLE Assessment RLE Assessment: Within Functional Limits LLE Assessment LLE Assessment: Exceptions to [facility] Passive Range of Motion (PROM) Comments: WNL General Strength Comments: 1/5 hip flexion (in sitting), 2-/5 dorsiflexion, 3/5 knee extension  Care Tool Care Tool Bed Mobility Roll left and right activity  Roll left and right activity did not occur: Safety/medical concerns      Sit to lying activity Sit to lying activity did not occur: Safety/medical concerns      Lying to sitting edge of bed activity Lying to sitting edge of bed activity did not occur: Safety/medical concerns Lying to sitting edge of bed assist level: Minimal Assistance - Patient > 75%     Care Tool Transfers Sit to stand transfer   Sit to stand assist level: Minimal Assistance - Patient > 75%    Chair/bed transfer   Chair/bed transfer assist level: Minimal Assistance - Patient > 75%     Toilet transfer   Assist Level: Minimal Assistance - Patient > 75%    Car transfer   Car transfer assist level: Moderate Assistance - Patient 50 - 74%       Care Tool Locomotion Ambulation   Assist level: Moderate Assistance - Patient 50 - 74% Assistive device: Walker-rolling Max distance: 25 ft  Walk 10 feet activity   Assist level: Moderate Assistance - Patient - 50 - 74% Assistive device: Walker-rolling   Walk 50 feet with 2 turns activity Walk 50 feet with 2 turns activity did not occur: Safety/medical concerns      Walk 150 feet activity Walk 150 feet activity did not occur: Safety/medical concerns      Walk 10 feet on uneven surfaces activity Walk 10 feet on uneven surfaces activity did not occur: Safety/medical concerns      Stairs   Assist level: Moderate Assistance - Patient - 50 - 74% Stairs assistive device: 2 hand rails Max number of stairs: 4  Walk up/down 1 step activity   Walk up/down 1 step (curb) assist level: Moderate Assistance - Patient - 50 - 74% Walk up/down 1 step or curb assistive device: 2 hand rails    Walk up/down 4 steps activity Walk up/down 4 steps assist level: Moderate Assistance - Patient - 50 - 74% Walk up/down 4 steps assistive device: 2 hand rails  Walk up/down 12 steps activity Walk up/down 12 steps activity did not occur: Safety/medical concerns      Pick up small objects from floor Pick up small object from the floor (from standing position) activity did not occur: Safety/medical concerns      Wheelchair Will patient use wheelchair at discharge?:  (TBD) Type of Wheelchair: Manual   Wheelchair assist level: Supervision/Verbal cueing Max wheelchair distance: 30 ft  Wheel 50 feet with 2 turns activity Wheelchair 50 feet with 2 turns activity did not occur: Safety/medical concerns    Wheel 150 feet activity Wheelchair 150 feet activity did not occur: Safety/medical concerns      Refer to Care Plan for Long Term Goals  SHORT TERM GOAL WEEK 1 PT Short Term Goal 1 (Week 1): Pt will negotiate 4 steps with B rails & CGA. PT Short Term Goal 2 (Week 1): Pt will ambulate 75 ft with LRAD &  min assist. PT Short Term Goal 3 (Week 1): Pt will complete bed mobility with min assist. PT Short Term Goal 4 (Week 1): Pt will complete bed<>w/c with LRAD & CGA.  Recommendations for other services: Neuropsych and Surveyor, mining group, Stress management and Outing/community reintegration  Skilled Therapeutic Intervention Pt received in w/c & agreeable to tx. Educated pt on ELOS, daily therapy schedule, weekly team meetings & other CIR information. Pt completes car transfer at sedan simulated height with RW & mod assist for turning, using BUE to  transfer LLE in/out of car. Gait x 6 ft + 6 ft + 25 ft with pattern noted below. Stair negotiation x 4 steps (6") with B rails & min assist to ascend, mod assist to descend with PT providing instructional cuing for compensatory technique & blocking L knee. W/c mobility x 25 ft with supervision with cuing for bigger pushes but pt unable to perform. Attempted bed mobility in apartment as pt reports her bed is slightly taller than that bed with pt also reporting she crawls onto her bed at home - unable to perform at this time 2/2 impaired strength, balance & safety. During session PT educates pt on safe hand placement for sit<>stand transfers; pt does demonstrate impaired safety awareness as she sits prior to allowing PT time to lock w/c brakes. Kinetron from w/c level with task focusing on BLE strengthening & NMR & endurance. At end of session pt left in w/c with chair alarm donned, call bell & all needs in reach.   Requested pt have family bring in clothes & tennis shoes. Also recommended pt taking box spring out to lower bed height but pt states she will sleep on smaller futon. Educated pt on recommendation of supervision at home upon d/c from [place].   Mobility Bed Mobility (per OT report) Bed Mobility: Supine to Sit Supine to Sit: Minimal Assistance - Patient > 75%  Transfers Transfers: Sit to Stand;Stand to Sit Sit to Stand: Minimal  Assistance - Patient > 75% Stand to Sit: Minimal Assistance - Patient > 75% Stand Pivot Transfer Details: Verbal cues for precautions/safety;Verbal cues for technique Transfer (Assistive device): Rolling walker   Locomotion  Gait Ambulation: Yes Gait Assistance: Minimal Assistance - Patient > 75%;Moderate Assistance - Patient 50-74% Gait Distance (Feet): 25 Feet Assistive device: Rolling walker Gait Assistance Details: Manual facilitation for weight shifting;Manual facilitation for placement Gait Assistance Details: occasional assistance for LLE foot advancement Gait Gait: Yes Gait Pattern: Impaired Gait Pattern: Decreased step length - right;Decreased stride length;Decreased step length - left;Decreased stance time - right;Decreased hip/knee flexion - left;Decreased weight shift to right Gait velocity: decreased Stairs / Additional Locomotion Stairs: Yes Stairs Assistance: Minimal Assistance - Patient > 75%;Moderate Assistance - Patient 50 - 74% (min assist to ascend, mod assist to descend) Stair Management Technique: Two rails Number of Stairs: 4 Height of Stairs: 6 (inches) Wheelchair Mobility Wheelchair Mobility: Yes Wheelchair Assistance: Chartered loss adjuster: Both upper extremities Wheelchair Parts Management: Needs assistance Distance: 30 ft   Discharge Criteria: Patient will be discharged from PT if patient refuses treatment 3 consecutive times without medical reason, if treatment goals not met, if there is a change in medical status, if patient makes no progress towards goals or if patient is discharged from hospital.  The above assessment, treatment plan, treatment alternatives and goals were discussed and mutually agreed upon: by patient  Waunita Schooner 10/13/2019, 3:22 PM

## 2019-10-13 NOTE — Progress Notes (Signed)
Inpatient Rehabilitation Medication Review by a Pharmacist  A complete drug regimen review was completed for this patient to identify any potential clinically significant medication issues.  Clinically significant medication issues were identified:  yes   Type of Medication Issue Identified Description of Issue Urgent (address now) Non-Urgent (address on AM team rounds) Plan Plan Accepted by Provider? (Yes / No / Pending AM Rounds)                          Medication Stop Date Needed Note plan for dual antiplatelet therapy for three weeks followed by clopidogrel alone. Aspirin does not have a stop date. Non-urgent Entered stop date for aspirin.  Issue Resolved  Additional Drug Therapy Needed  Senna and lactulose not ordered upon transfer - noted patient is refusing scheduled laxatives and have been changed to PRN. Non-urgent Resolved Yes  Other         Name of provider notified for urgent issues identified:   Provider Method of Notification:    For non-urgent medication issues to be resolved on team rounds tomorrow morning a [facility] [place] was sent to:    Pharmacist comments: All issues resolved.  Time spent performing this drug regimen review (minutes):  10   Norva Riffle 10/13/2019 8:14 AM

## 2019-10-13 NOTE — Progress Notes (Signed)
Slept good. Denied pain. Continent of urine, using bedpan. LBM 09/02. Melissa Holt A

## 2019-10-13 NOTE — Progress Notes (Signed)
[name] PHYSICAL MEDICINE & REHABILITATION PROGRESS NOTE   Subjective/Complaints:   Pt reports LBM was Wednight/Thursday AM- went multiple times with "the laxative we gave her".   Continent of Bladder- doing "all right"- no dysuria- wants OOB- SLP in room currently.   ROS:  Pt denies SOB, abd pain, CP, N/V/C/D, and vision changes   Objective:   No results found. Recent Labs    10/13/19 1143  WBC 13.9*  HGB 14.1  HCT 43.7  PLT 215   Recent Labs    10/12/19 1413 10/13/19 1143  NA 136 137  K 3.8 4.0  CL 104 105  CO2 19* 22  GLUCOSE 133* 102*  BUN 24* 27*  CREATININE 1.10* 0.94  CALCIUM 9.9 10.1    Intake/Output Summary (Last 24 hours) at 10/13/2019 1344 Last data filed at 10/13/2019 1248 Gross per 24 hour  Intake 942 ml  Output --  Net 942 ml     Physical Exam: Vital Signs Blood pressure 138/76, pulse 61, temperature 97.8 F (36.6 C), resp. rate 18, height 4\' 11"  (1.499 m), weight 66.1 kg, SpO2 98 %.  General: pt sitting up in bed; appropriate, NAD HEENT: conjugate gaze Heart: borderline bradycardia, regular rhythm Chest: CTA B/L- no W/R/R- good air movement Abdomen: Soft, NT, ND, (+)BS - hypoactive Extremities: No clubbing, cyanosis, or edema. Pulses are 2+ Skin: Clean and intact without signs of breakdown Neuro: Ox3 Cranial nerves 2-12 are intact except for left facial droop.Dysarthric speech with occasional wet voice. Able to follow basic commands without difficulty.LLE 2/5 HF, KE, appropriate  Assessment/Plan: 1. Functional deficits secondary to acute/subacute CVA with chronic lacunar infarcts which require 3+ hours per day of interdisciplinary therapy in a comprehensive inpatient rehab setting.  Physiatrist is providing close team supervision and 24 hour management of active medical problems listed below.  Physiatrist and rehab team continue to assess barriers to discharge/monitor patient progress toward functional and medical goals  Care  Tool:  Bathing              Bathing assist       Upper Body Dressing/Undressing Upper body dressing   What is the patient wearing?: Hospital gown only    Upper body assist Assist Level: Moderate Assistance - Patient 50 - 74%    Lower Body Dressing/Undressing Lower body dressing      What is the patient wearing?: Incontinence brief     Lower body assist Assist for lower body dressing: Maximal Assistance - Patient 25 - 49%     Toileting Toileting    Toileting assist Assist for toileting: Maximal Assistance - Patient 25 - 49%     Transfers Chair/bed transfer  Transfers assist           Locomotion Ambulation   Ambulation assist              Walk 10 feet activity   Assist           Walk 50 feet activity   Assist           Walk 150 feet activity   Assist           Walk 10 feet on uneven surface  activity   Assist           Wheelchair     Assist               Wheelchair 50 feet with 2 turns activity    Assist  Wheelchair 150 feet activity     Assist          Blood pressure 138/76, pulse 61, temperature 97.8 F (36.6 C), resp. rate 18, height 4\' 11"  (1.499 m), weight 66.1 kg, SpO2 98 %.  Medical Problem List and Plan: 1.  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  Antithrombotics: -DVT/anticoagulation:  Pharmaceutical: Lovenox             -antiplatelet therapy: DAPT x3 weeks followed by Plavix alone 3. Pain Management: Tylenol prn.  4. Mood: LCSW to follow for evaluation and support.              -antipsychotic agents: N/A 5. Neuropsych: This patient is capable of making decisions on her own behalf. 6. Skin/Wound Care: Routine pressure-relief measures 7. Fluids/Electrolytes/Nutrition: Monitor intake/output. Electrolytes  stable on 9/3. Check electrolytes in a.m. 8. HTN: Monitor blood pressure 3 times daily. Continue amlodipine. 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 10. Obstipation: Has resolved and is refusing laxative--multiple BMs 09/01 and BP on 9/3. Will encourage miralax daily.    11. Prediabetes: Hgb A1c-5.8. Educate on appropriate diet  12. Leukocytosis  9/4- likely Prednisone is cause of Leukocytosis, afebrile, however will double check and Check U/A and Cx as well as CXR- just in case.  13. GERD/Reflux Sx's  9/4- SLP said pt belching and having a lot of reflux Sx's- asked [place] to start PPI- was started.  14. Prerenal azotemia  9/4- BUN 27- Cr 0.94- will encourage PO    LOS: 1 days A FACE TO FACE EVALUATION WAS PERFORMED  Melissa Holt 10/13/2019, 1:44 PM

## 2019-10-14 LAB — GLUCOSE, CAPILLARY
Glucose-Capillary: 91 mg/dL (ref 70–99)
Glucose-Capillary: 91 mg/dL (ref 70–99)
Glucose-Capillary: 132 mg/dL — ABNORMAL HIGH (ref 70–99)
Glucose-Capillary: 181 mg/dL — ABNORMAL HIGH (ref 70–99)

## 2019-10-14 LAB — URINALYSIS, ROUTINE W REFLEX MICROSCOPIC
Bilirubin Urine: NEGATIVE
Glucose, UA: NEGATIVE mg/dL
Hgb urine dipstick: NEGATIVE
Ketones, ur: NEGATIVE mg/dL
Leukocytes,Ua: NEGATIVE
Nitrite: NEGATIVE
Protein, ur: NEGATIVE mg/dL
Specific Gravity, Urine: 1.014 (ref 1.005–1.030)
pH: 5 (ref 5.0–8.0)

## 2019-10-14 LAB — BASIC METABOLIC PANEL
Anion gap: 9 (ref 5–15)
BUN: 24 mg/dL — ABNORMAL HIGH (ref 8–23)
CO2: 22 mmol/L (ref 22–32)
Calcium: 9.6 mg/dL (ref 8.9–10.3)
Chloride: 107 mmol/L (ref 98–111)
Creatinine, Ser: 0.9 mg/dL (ref 0.44–1.00)
GFR calc Af Amer: >60 mL/min (ref >60)
GFR calc non Af Amer: >60 mL/min (ref >60)
Glucose, Bld: 99 mg/dL (ref 70–99)
Potassium: 4.1 mmol/L (ref 3.5–5.1)
Sodium: 138 mmol/L (ref 135–145)

## 2019-10-14 LAB — CBC WITH DIFFERENTIAL/PLATELET
Abs Immature Granulocytes: 0 10*3/uL (ref 0.00–0.07)
Basophils Absolute: 0 10*3/uL (ref 0.0–0.1)
Basophils Relative: 0 %
Eosinophils Absolute: 0 10*3/uL (ref 0.0–0.5)
Eosinophils Relative: 0 %
HCT: 39.2 % (ref 36.0–46.0)
Hemoglobin: 12.5 g/dL (ref 12.0–15.0)
Lymphocytes Relative: 24 %
Lymphs Abs: 2.8 10*3/uL (ref 0.7–4.0)
MCH: 30 pg (ref 26.0–34.0)
MCHC: 31.9 g/dL (ref 30.0–36.0)
MCV: 94.2 fL (ref 80.0–100.0)
Monocytes Absolute: 0.6 10*3/uL (ref 0.1–1.0)
Monocytes Relative: 5 %
Neutro Abs: 8.3 10*3/uL — ABNORMAL HIGH (ref 1.7–7.7)
Neutrophils Relative %: 71 %
Platelets: 196 10*3/uL (ref 150–400)
RBC: 4.16 MIL/uL (ref 3.87–5.11)
RDW: 12.5 % (ref 11.5–15.5)
WBC: 11.7 10*3/uL — ABNORMAL HIGH (ref 4.0–10.5)
nRBC: 0 % (ref 0.0–0.2)
nRBC: 0 /100 WBC

## 2019-10-14 NOTE — Progress Notes (Signed)
[place] PHYSICAL MEDICINE & REHABILITATION PROGRESS NOTE   Subjective/Complaints:   Pt reports 2 BMs yesterday- went well- but they were loose.  Also, walking to bathroom with staff to deal with B/B issues.   Having chills all over- intermittent- day and night- "goes down to legs"- couldn't explain what she meant further.    ROS:  Pt denies SOB, abd pain, CP, N/V/C/D, and vision changes   Objective:   DG CHEST PORT 1 VIEW  Result Date: 10/13/2019 CLINICAL DATA:  Leukocytosis. EXAM: PORTABLE CHEST 1 VIEW COMPARISON:  Thoracic spine CT dated 09/28/2019 FINDINGS: Mild enlargement of the cardiopericardial silhouette. Masslike opacity in the right superior mediastinum deviates the trachea to the left. No other mediastinal masses. No hilar masses or enlarged lymph nodes. There are calcified right hilar lymph nodes. Lungs are clear. No convincing pleural effusion and no pneumothorax. Skeletal structures are demineralized, but grossly intact. IMPRESSION: 1. No acute cardiopulmonary disease. 2. Cardiomegaly. Enlarged thyroid deviating the trachea to the left. 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 Electronically Signed   By: Lajean Manes M.D.   On: 10/13/2019 14:15   Recent Labs    10/13/19 1143 10/14/19 0500  WBC 13.9* 11.7*  HGB 14.1 12.5  HCT 43.7 39.2  PLT 215 196   Recent Labs    10/13/19 1143 10/14/19 0500  NA 137 138  K 4.0 4.1  CL 105 107  CO2 22 22  GLUCOSE 102* 99  BUN 27* 24*  CREATININE 0.94 0.90  CALCIUM 10.1 9.6    Intake/Output Summary (Last 24 hours) at 10/14/2019 1259 Last data filed at 10/14/2019 0727 Gross per 24 hour  Intake 360 ml  Output 0 ml  Net 360 ml     Physical Exam: Vital Signs Blood pressure 126/72, pulse (!) 57, temperature 98.1 F (36.7 C), resp. rate 18, height 4\' 11"  (1.499 m), weight 66.1 kg, SpO2 98 %.  General: pt sitting up in bedside chair, appropriate, NAD HEENT: conjugate gaze Heart: mild  bradycardia, regular rhythm Chest: CTA B/L- no W/R/R- good air movement Abdomen: Soft, NT, ND, (+)BS  Extremities: No clubbing, cyanosis, or edema. Pulses are 2+ Skin: Clean and intact without signs of breakdown Neuro: Ox3 Cranial nerves 2-12 are intact except for left facial droop.Dysarthric speech with occasional wet voice. Able to follow basic commands without difficulty.LLE 2/5 HF, KE, appropriate Has goose bumps all over right now  Assessment/Plan: 1. Functional deficits secondary to acute/subacute CVA with chronic lacunar infarcts which require 3+ hours per day of interdisciplinary therapy in a comprehensive inpatient rehab setting.  Physiatrist is providing close team supervision and 24 hour management of active medical problems listed below.  Physiatrist and rehab team continue to assess barriers to discharge/monitor patient progress toward functional and medical goals  Care Tool:  Bathing    Body parts bathed by patient: Left arm, Chest, Abdomen, Front perineal area, Right upper leg, Left upper leg, Face   Body parts bathed by helper: Right lower leg, Left lower leg, Right arm, Buttocks     Bathing assist Assist Level: Minimal Assistance - Patient > 75%     Upper Body Dressing/Undressing Upper body dressing   What is the patient wearing?: Pull over shirt    Upper body assist Assist Level: Moderate Assistance - Patient 50 - 74%    Lower Body Dressing/Undressing Lower body dressing      What is the patient wearing?: Underwear/pull up     Lower body assist Assist  for lower body dressing: Maximal Assistance - Patient 25 - 49%     Toileting Toileting    Toileting assist Assist for toileting: Minimal Assistance - Patient > 75%     Transfers Chair/bed transfer  Transfers assist     Chair/bed transfer assist level: Minimal Assistance - Patient > 75%     Locomotion Ambulation   Ambulation assist      Assist level: Moderate Assistance - Patient 50 -  74% Assistive device: Walker-rolling Max distance: 25 ft   Walk 10 feet activity   Assist     Assist level: Moderate Assistance - Patient - 50 - 74% Assistive device: Walker-rolling   Walk 50 feet activity   Assist Walk 50 feet with 2 turns activity did not occur: Safety/medical concerns         Walk 150 feet activity   Assist Walk 150 feet activity did not occur: Safety/medical concerns         Walk 10 feet on uneven surface  activity   Assist Walk 10 feet on uneven surfaces activity did not occur: Safety/medical concerns         Wheelchair     Assist Will patient use wheelchair at discharge?:  (TBD) Type of Wheelchair: Manual    Wheelchair assist level: Supervision/Verbal cueing Max wheelchair distance: 30 ft    Wheelchair 50 feet with 2 turns activity    Assist    Wheelchair 50 feet with 2 turns activity did not occur: Safety/medical concerns       Wheelchair 150 feet activity     Assist  Wheelchair 150 feet activity did not occur: Safety/medical concerns       Blood pressure 126/72, pulse (!) 57, temperature 98.1 F (36.7 C), resp. rate 18, height 4\' 11"  (1.499 m), weight 66.1 kg, SpO2 98 %.  Medical Problem List and Plan: 1.  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  Antithrombotics: -DVT/anticoagulation:  Pharmaceutical: Lovenox             -antiplatelet therapy: DAPT x3 weeks followed by Plavix alone 3. Pain Management: Tylenol prn.  4. Mood: LCSW to follow for evaluation and support.              -antipsychotic agents: N/A 5. Neuropsych: This patient is capable of making decisions on her own behalf. 6. Skin/Wound Care: Routine pressure-relief measures 7. Fluids/Electrolytes/Nutrition: Monitor intake/output. Electrolytes stable on 9/3. Check electrolytes in a.m. 8.  HTN: Monitor blood pressure 3 times daily. Continue amlodipine. 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 10. Obstipation: Has resolved and is refusing laxative--multiple BMs 09/01 and BP on 9/3. Will encourage miralax daily.    11. Prediabetes: Hgb A1c-5.8. Educate on appropriate diet  12. Leukocytosis  9/4- likely Prednisone is cause of Leukocytosis, afebrile, however will double check and Check U/A and Cx as well as CXR- just in case.   9/5- WBC down to 11.7- CXR looks OK; U/A (-)- is Steroids- con't regimen 13. GERD/Reflux Sx's  9/4- SLP said pt belching and having a lot of reflux Sx's- asked [place] to start PPI- was started.  14. Prerenal azotemia  9/4- BUN 27- Cr 0.94- will encourage PO  9/5- BUN down to 24- drinking a little better  LOS: 2 days A FACE TO FACE EVALUATION WAS PERFORMED  Trooper Olander 10/14/2019, 12:59 PM

## 2019-10-14 NOTE — Progress Notes (Signed)
Pt up in chair this morning ate 50% of breakfast, c/o pain in Right 5th (pinky) toe states she broke it PTA tylenol given with relief.

## 2019-10-14 NOTE — Progress Notes (Signed)
Clean catch, UA C&S sent at 0056. Melissa Holt

## 2019-10-15 ENCOUNTER — Inpatient Hospital Stay (HOSPITAL_COMMUNITY): Payer: Medicare Other

## 2019-10-15 ENCOUNTER — Inpatient Hospital Stay (HOSPITAL_COMMUNITY): Payer: Medicare Other | Admitting: Occupational Therapy

## 2019-10-15 ENCOUNTER — Inpatient Hospital Stay (HOSPITAL_COMMUNITY): Payer: Medicare Other | Admitting: Speech Pathology

## 2019-10-15 DIAGNOSIS — I63521 Cerebral infarction due to unspecified occlusion or stenosis of right anterior cerebral artery: Secondary | ICD-10-CM

## 2019-10-15 LAB — URINE CULTURE

## 2019-10-15 LAB — CBC
HCT: 40.3 % (ref 36.0–46.0)
Hemoglobin: 13 g/dL (ref 12.0–15.0)
MCH: 30.8 pg (ref 26.0–34.0)
MCHC: 32.3 g/dL (ref 30.0–36.0)
MCV: 95.5 fL (ref 80.0–100.0)
Platelets: 204 10*3/uL (ref 150–400)
RBC: 4.22 MIL/uL (ref 3.87–5.11)
RDW: 12.5 % (ref 11.5–15.5)
WBC: 10.5 10*3/uL (ref 4.0–10.5)
nRBC: 0 % (ref 0.0–0.2)

## 2019-10-15 LAB — GLUCOSE, CAPILLARY
Glucose-Capillary: 92 mg/dL (ref 70–99)
Glucose-Capillary: 104 mg/dL — ABNORMAL HIGH (ref 70–99)
Glucose-Capillary: 115 mg/dL — ABNORMAL HIGH (ref 70–99)
Glucose-Capillary: 128 mg/dL — ABNORMAL HIGH (ref 70–99)

## 2019-10-15 NOTE — Progress Notes (Signed)
Speech Language Pathology Daily Session Note  Patient Details  Name: Melissa Holt MRN: [id number] Date of Birth: [birth year]  Today's Date: 10/15/2019 SLP Individual Time: 9357-0177 SLP Individual Time Calculation (min): 58 min  Short Term Goals: Week 1: SLP Short Term Goal 1 (Week 1): Patient will consume current diet without overt s/s of aspiration with Mod I for use of swallowing compensatory strategies. SLP Short Term Goal 2 (Week 1): Patient will demonstrate functional problem solving for mildly complex tasks with supervision verbal cues. SLP Short Term Goal 3 (Week 1): Patient will recall new, daily information with Min A verbal and visual cues.  Skilled Therapeutic Interventions: Pt was seen for skilled ST targeting dysphagia and cognitive goals. SLP facilitated session with skilled observation of pt consuming thin liquids to assess tolerance. No overt s/sx aspiration observed throughout intake via straw with Supervision A verbal cues for use of safe swallow strategies. Recommend continue current diet. SLP further facilitated session with Min A verbal and visual cues for problem solving and error awareness during basic money and medication management tasks from the AFL[facility]. Most cueing required for calculating change (subtraction) when using cash and coins, and for accurate organization of "pills" within on basic QID pill chart when interpreting medication labels. Pt left sitting in wheelchair with alarm set and needs within reach. Continue per current plan of care.         Pain Pain Assessment Pain Scale: 0-10 Pain Score: 0-No pain  Therapy/Group: Individual Therapy  Arbutus Leas 10/15/2019, 2:36 PM

## 2019-10-15 NOTE — Progress Notes (Signed)
Patient declined request to go to toilet. Unknown when the last time patient urinated per documentation. Patient states that she was taken, but could not tell who & refused to try to void or be catheterized. Bladder was scanned & revealed <130ml in bladder.  Will check again in a few hours to see if patient will void & to rescan her bladder.

## 2019-10-15 NOTE — Progress Notes (Signed)
Occupational Therapy Session Note  Patient Details  Name: Melissa Holt MRN: [id number] Date of Birth: 1940/05/07  Today's Date: 10/15/2019 OT Individual Time: 1101-1157 OT Individual Time Calculation (min): 56 min    Short Term Goals: Week 1:  OT Short Term Goal 1 (Week 1): Pt will complete LB dressing using AE, prn, with min assist. OT Short Term Goal 2 (Week 1): Pt will complete toilet transfer with supervision and no more than min cues for sequencing/safety. OT Short Term Goal 3 (Week 1): Pt will complete bathing with supervision and no more than min cues for sequencing/safety.  Skilled Therapeutic Interventions/Progress Updates:    Pt up in wheelchair to begin session.  She was rolled down to the therapy gym for session with min guard assist transfer to the therapy mat using the RW for support.  Had her work on LUE strengthening and coordination during session.  She was able to complete shoulder flexion bilaterally in sitting using 1 lb dowel rod for 4 sets of 6-8 reps with min assist.  She then progressed to completion of nine hole peg test with BUEs.  She was able to complete this on the right in 36 seconds and on the left in 43 seconds.  Provided education on FM coordination task of picking up small foam pieces with the LUE one at a time and translation to the palm, then placing them in the cup. She needed increased time, with some difficulty noted when attempting to translate them out to the fingertips from the palm.  She was able to complete 98% of the pieces without dropping them.  Finished session with transfer back to the wheelchair and return to the room.  She was left with the call button and phone in reach and safety belt in place.    Therapy Documentation Precautions:  Precautions Precautions: Fall Restrictions Weight Bearing Restrictions: No  Pain: Pain Assessment Pain Scale: Faces Pain Score: 0-No pain ADL: See Care Tool Section for some details of mobility and  selfcare  Therapy/Group: Individual Therapy  [name] OTR/L 10/15/2019, 12:45 PM

## 2019-10-15 NOTE — Progress Notes (Signed)
Physical Therapy Session Note  Patient Details  Name: Melissa Holt MRN: [id number] Date of Birth: 1940-09-16  Today's Date: 10/15/2019 PT Individual Time: 0800-0914 PT Individual Time Calculation (min): 74 min   Short Term Goals: Week 1:  PT Short Term Goal 1 (Week 1): Pt will negotiate 4 steps with B rails & CGA. PT Short Term Goal 2 (Week 1): Pt will ambulate 75 ft with LRAD & min assist. PT Short Term Goal 3 (Week 1): Pt will complete bed mobility with min assist. PT Short Term Goal 4 (Week 1): Pt will complete bed<>w/c with LRAD & CGA.  Skilled Therapeutic Interventions/Progress Updates:   Patient received sitting up in bed agreeable to PT. She endorses pain at R met-heads and relates this to fx 5th metatarsal a few weeks ago. She did not rate pain. PT provided seated rest breaks throughout therapy for pain management. Patient requesting to complete morning ADLs at sink. MinA for upper body dressing and verbal cues to engage L UE. ModA for lower body dressing and MaxA for footwear. Patient demonstrating slight L lateral lean in sitting as she fatigues, but is able to self correct without verbal cuing. Patient completing STS with FWW and CGA. Patient ambulating 5x30f with FWW and CGA-MinA. She displays decreased L foot clearance with slight foot drop. Difficult to assess d/t long pants- but patient with intermittent genu recurvatum to L LE in stance. She may benefit from AFO trial to assess for improved foot clearance and knee stability. Patient completed 10 mins on NuStep level 3 for improved coordination and reciprocal sequencing of B LE. Patient returned to room in chair, seatbelt alarm in place, call light within reach.   Therapy Documentation Precautions:  Precautions Precautions: Back Restrictions Weight Bearing Restrictions: No    Therapy/Group: Individual Therapy  JKaroline Caldwell PT, DPT, CBIS 10/15/2019, 7:41 AM

## 2019-10-15 NOTE — Progress Notes (Signed)
[place] PHYSICAL MEDICINE & REHABILITATION PROGRESS NOTE   Subjective/Complaints: Patient is eating lunch, feels little tired after therapy but otherwise no complaints Has chills but no sweats, has been afebrile no pain with urination no cough ROS:  Pt denies SOB, abd pain, CP, negative for nausea vomiting diarrhea   Objective:   DG CHEST PORT 1 VIEW  Result Date: 10/13/2019 CLINICAL DATA:  Leukocytosis. EXAM: PORTABLE CHEST 1 VIEW COMPARISON:  Thoracic spine CT dated 09/28/2019 FINDINGS: Mild enlargement of the cardiopericardial silhouette. Masslike opacity in the right superior mediastinum deviates the trachea to the left. No other mediastinal masses. No hilar masses or enlarged lymph nodes. There are calcified right hilar lymph nodes. Lungs are clear. No convincing pleural effusion and no pneumothorax. Skeletal structures are demineralized, but grossly intact. IMPRESSION: 1. No acute cardiopulmonary disease. 2. Cardiomegaly. Enlarged thyroid deviating the trachea to the left. 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 Electronically Signed   By: Lajean Manes M.D.   On: 10/13/2019 14:15   Recent Labs    10/14/19 0500 10/15/19 0529  WBC 11.7* 10.5  HGB 12.5 13.0  HCT 39.2 40.3  PLT 196 204   Recent Labs    10/13/19 1143 10/14/19 0500  NA 137 138  K 4.0 4.1  CL 105 107  CO2 22 22  GLUCOSE 102* 99  BUN 27* 24*  CREATININE 0.94 0.90  CALCIUM 10.1 9.6    Intake/Output Summary (Last 24 hours) at 10/15/2019 1213 Last data filed at 10/15/2019 0700 Gross per 24 hour  Intake 360 ml  Output 425 ml  Net -65 ml     Physical Exam: Vital Signs Blood pressure (!) 148/81, pulse 60, temperature 97.6 F (36.4 C), resp. rate 16, height 4\' 11"  (1.499 m), weight 66.1 kg, SpO2 99 %.   General: No acute distress Mood and affect are appropriate Heart: Regular rate and rhythm no rubs murmurs or extra sounds Lungs: Clear to auscultation, breathing unlabored,  no rales or wheezes Abdomen: Positive bowel sounds, soft nontender to palpation, nondistended Extremities: No clubbing, cyanosis, or edema Skin: No evidence of breakdown, no evidence of rash Neurologic: Cranial nerves II through XII intact, motor strength is 5/5 in right and 4/5 left deltoid, bicep, tricep, grip, hip flexor, knee extensors, ankle dorsiflexor and plantar flexor Decreased fine motor left upper extremity Musculoskeletal: Full range of motion in all 4 extremities. No joint swelling   Assessment/Plan: 1. Functional deficits secondary to acute/subacute CVA with chronic lacunar infarcts which require 3+ hours per day of interdisciplinary therapy in a comprehensive inpatient rehab setting.  Physiatrist is providing close team supervision and 24 hour management of active medical problems listed below.  Physiatrist and rehab team continue to assess barriers to discharge/monitor patient progress toward functional and medical goals  Care Tool:  Bathing    Body parts bathed by patient: Left arm, Chest, Abdomen, Front perineal area, Right upper leg, Left upper leg, Face   Body parts bathed by helper: Right lower leg, Left lower leg, Right arm, Buttocks     Bathing assist Assist Level: Minimal Assistance - Patient > 75%     Upper Body Dressing/Undressing Upper body dressing   What is the patient wearing?: Pull over shirt    Upper body assist Assist Level: Minimal Assistance - Patient > 75%    Lower Body Dressing/Undressing Lower body dressing      What is the patient wearing?: Underwear/pull up, Pants     Lower body assist Assist  for lower body dressing: Moderate Assistance - Patient 50 - 74%     Toileting Toileting    Toileting assist Assist for toileting: Moderate Assistance - Patient 50 - 74% (assist with urinal, pants and brief)     Transfers Chair/bed transfer  Transfers assist     Chair/bed transfer assist level: Minimal Assistance - Patient > 75%      Locomotion Ambulation   Ambulation assist      Assist level: Moderate Assistance - Patient 50 - 74% Assistive device: Walker-rolling Max distance: 25 ft   Walk 10 feet activity   Assist     Assist level: Moderate Assistance - Patient - 50 - 74% Assistive device: Walker-rolling   Walk 50 feet activity   Assist Walk 50 feet with 2 turns activity did not occur: Safety/medical concerns         Walk 150 feet activity   Assist Walk 150 feet activity did not occur: Safety/medical concerns         Walk 10 feet on uneven surface  activity   Assist Walk 10 feet on uneven surfaces activity did not occur: Safety/medical concerns         Wheelchair     Assist Will patient use wheelchair at discharge?:  (TBD) Type of Wheelchair: Manual    Wheelchair assist level: Supervision/Verbal cueing Max wheelchair distance: 30 ft    Wheelchair 50 feet with 2 turns activity    Assist    Wheelchair 50 feet with 2 turns activity did not occur: Safety/medical concerns       Wheelchair 150 feet activity     Assist  Wheelchair 150 feet activity did not occur: Safety/medical concerns       Blood pressure (!) 148/81, pulse 60, temperature 97.6 F (36.4 C), resp. rate 16, height 4\' 11"  (1.499 m), weight 66.1 kg, SpO2 99 %.  Medical Problem List and Plan: 1.  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  Antithrombotics: -DVT/anticoagulation:  Pharmaceutical: Lovenox             -antiplatelet therapy: DAPT x3 weeks followed by Plavix alone 3. Pain Management: Tylenol prn.  4. Mood: LCSW to follow for evaluation and support.              -antipsychotic agents: N/A 5. Neuropsych: This patient is capable of making decisions on her own behalf. 6. Skin/Wound Care: Routine pressure-relief measures 7.  Fluids/Electrolytes/Nutrition: Monitor intake/output. Electrolytes stable on 9/3. Check electrolytes in a.m. 8. HTN: Monitor blood pressure 3 times daily. Continue amlodipine. 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 10. Obstipation: Has resolved and is refusing laxative--multiple BMs 09/01 and BP on 9/3. Will encourage miralax daily.    11. Prediabetes: Hgb A1c-5.8. Educate on appropriate diet  12. Leukocytosis resolved  9/4- likely Prednisone is cause of Leukocytosis, afebrile, negative chest x-ray, negative UA 13. GERD/Reflux Sx's  9/4- SLP said pt belching and having a lot of reflux Sx's- asked [place] to start PPI- was started.  14. Prerenal azotemia  9/4- BUN 27-  Cr 0.94- will encourage PO  9/5- BUN down to 24- drinking a little better    LOS: 3 days A FACE TO FACE EVALUATION WAS PERFORMED  Charlett Blake 10/15/2019, 12:13 PM

## 2019-10-16 ENCOUNTER — Inpatient Hospital Stay (HOSPITAL_COMMUNITY): Payer: Medicare Other | Admitting: Occupational Therapy

## 2019-10-16 ENCOUNTER — Inpatient Hospital Stay (HOSPITAL_COMMUNITY): Payer: Medicare Other | Admitting: Physical Therapy

## 2019-10-16 ENCOUNTER — Inpatient Hospital Stay (HOSPITAL_COMMUNITY): Payer: Medicare Other | Admitting: Speech Pathology

## 2019-10-16 LAB — GLUCOSE, CAPILLARY
Glucose-Capillary: 88 mg/dL (ref 70–99)
Glucose-Capillary: 76 mg/dL (ref 70–99)
Glucose-Capillary: 104 mg/dL — ABNORMAL HIGH (ref 70–99)
Glucose-Capillary: 122 mg/dL — ABNORMAL HIGH (ref 70–99)

## 2019-10-16 MED ORDER — BLOOD PRESSURE CONTROL BOOK
Freq: Once | Status: AC
  Filled 2019-10-16: qty 1

## 2019-10-16 NOTE — Progress Notes (Signed)
Speech Language Pathology Daily Session Note  Patient Details  Name: Kayron Kalmar MRN: [id number] Date of Birth: October 07, 1940  Today's Date: 10/16/2019 SLP Individual Time: 0800-0900 SLP Individual Time Calculation (min): 60 min  Short Term Goals: Week 1: SLP Short Term Goal 1 (Week 1): Patient will consume current diet without overt s/s of aspiration with Mod I for use of swallowing compensatory strategies. SLP Short Term Goal 2 (Week 1): Patient will demonstrate functional problem solving for mildly complex tasks with supervision verbal cues. SLP Short Term Goal 3 (Week 1): Patient will recall new, daily information with Min A verbal and visual cues.  Skilled Therapeutic Interventions:   Patient seen for skilled ST session focusing on cognitive goals. Patient was oriented to time, place, situation and did not have significant difficulty with attention during complex level planning/problem solving task. Patient told SLP that she rarely goes to the doctor "unless I really need to" and doesn't take medications as she tries to use more natural type remedies. During simulated daily schedule planning activity, SLP had to frequently tell patient to go by the rules/constraints that were presented rather than telling what she would do, "I'd have someone else go pick up the cake", etc. Overall, she required min-mod frequency but only minA intensity to complete this task. Patient continues to benefit from skilled SLP intervention to maximize cognitive and swallow function prior to discharge.  Pain Pain Assessment Pain Scale: 0-10 Pain Score: 0-No pain  Therapy/Group: Individual Therapy  Sonia Baller, MA, CCC-SLP 10/16/19 12:11 PM

## 2019-10-16 NOTE — Progress Notes (Signed)
Occupational Therapy Session Note  Patient Details  Name: Melissa Holt MRN: [id number] Date of Birth: 02-28-1940  Today's Date: 10/16/2019 OT Individual Time: 1106-1200 OT Individual Time Calculation (min): 54 min    Short Term Goals: Week 1:  OT Short Term Goal 1 (Week 1): Pt will complete LB dressing using AE, prn, with min assist. OT Short Term Goal 2 (Week 1): Pt will complete toilet transfer with supervision and no more than min cues for sequencing/safety. OT Short Term Goal 3 (Week 1): Pt will complete bathing with supervision and no more than min cues for sequencing/safety.  Skilled Therapeutic Interventions/Progress Updates:    Pt in wheelchair to start session.  Had her focus on doffing shoes and socks and then donning them to practice for transition home.  Had her transfer to a regular chair to work on this as the wheelchair was too high.  She needed min assist for transfer and then she was able to remove her socks and shoes with increased time and min guard assist.  She then donned them at the same level, but therapist had to guard her to keep from possibly sliding off of the chair as she reached down to her feet.  She demonstrated moderate difficulty trying to donn the left shoe secondary to increased swelling.  Next, therapist had her transfer back to the wheelchair at min assist level and then she was taken down to the ortho gym.  She worked on [employer] with use of the UE ergonometer.  She completed 2 sets of 3 mins each using BUEs with the first 1 at level 5 resistance and the second at level 8.  She maintained RPMs at 22-25.  She needed occasional re-gripping of the LUE during each set.  Two other sets of 2 mins each were completed on level 5 resistance with isolated use of the LUE only.  RPMs were maintained around 20 as well.  She demonstrated some difficulty with peddling backwards with isolated use of the LUE and needed occasional min assist for support.  No assist was  needed when peddling forward.  Returned to the room at the end of the session with the call button and phone in reach and safety belt in place. NT in to check BP.    Therapy Documentation Precautions:  Precautions Precautions: Fall Restrictions Weight Bearing Restrictions: No  Pain: Pain Assessment Pain Scale: 0-10 Pain Score: 0-No pain ADL: See Care Tool Section for some details of mobility and selfcare  Therapy/Group: Individual Therapy  Jaydence Arnesen OTR/L 10/16/2019, 12:50 PM

## 2019-10-16 NOTE — Progress Notes (Signed)
° °  Patient Details  Name: Melissa Holt MRN: [id number] Date of Birth: 1940/05/25  Today's Date: 10/16/2019  Hospital Problems: Active Problems:   * No active hospital problems. *  Past Medical History:  Past Medical History:  Diagnosis Date   Cancer [facility])    Past Surgical History:  Past Surgical History:  Procedure Laterality Date   COLON SURGERY     Social History:  reports that she has never smoked. Her smokeless tobacco use includes snuff. She reports current alcohol use. She reports that she does not use drugs.  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 Informed her we cannot allow initionally   Abuse/Neglect Abuse/Neglect Assessment Can Be Completed: Yes Physical Abuse: Denies Verbal Abuse: Denies Sexual Abuse: Denies Exploitation of patient/patient's resources: Denies Self-Neglect: Denies  Emotional Status Pt's affect, behavior and adjustment status: PAtient focused on returning to work. Reccommended to Neuro for coping Recent Psychosocial Issues: no Psychiatric History: no Substance Abuse History: Tobacco  Patient / Family Perceptions, Expectations & Goals Pt/Family understanding of illness & functional limitations: Family informed thus far Premorbid pt/family roles/activities: MOD w/ RW. Typically independent until 1 week ago. Needs assistance (started using rw 1 week ago) Anticipated changes in roles/activities/participation: Needs assistance Pt/family expectations/goals: Goal of family for patient to  dicharge back to mobile home (Independent)  Recruitment consultant: None Premorbid Home Care/DME Agencies: None Librarian, academic) Transportation available at discharge: Family able to Scientist, clinical (histocompatibility and immunogenetics) referrals recommended: Neuropsychology  Discharge Planning Living Arrangements: Alone Support Systems: Children, Other (Comment) (granddaughter) Type of Residence: Private residence (4 Steps to enter home (L and R Railing)) Insurance Resources: Multimedia programmer (specify) Living Expenses: Rent Money Management: Patient Does the patient have any problems obtaining your medications?: No Home Management: Previously independent Patient/Family Preliminary Plans: Will need assistance Care Coordinator Barriers to Discharge: Inaccessible home environment Care Coordinator Barriers to Discharge Comments: Son building ramp Care Coordinator Anticipated Follow Up Needs: HH/OP, SNF Expected length of stay: 10-14 Days  Clinical Impression Sw entered room, son at bedside. Introduced self, explained role and process, addressed questions and concerns. Will continue to follow up with questions and concerns.   Dyanne Iha 10/16/2019, 2:06 PM

## 2019-10-16 NOTE — Progress Notes (Signed)
Inpatient Rehabilitation  Patient information reviewed and entered into eRehab system by Sheletha Bow M. Batina Dougan, M.A., CCC/SLP, PPS Coordinator.  Information including medical coding, functional ability and quality indicators will be reviewed and updated through discharge.    

## 2019-10-16 NOTE — Progress Notes (Signed)
Inpatient [facility] Statement of Services  Patient Name:  Melissa Holt  Date:  10/16/2019  Welcome to the [facility].  Our goal is to provide you with an individualized program based on your diagnosis and situation, designed to meet your specific needs.  With this comprehensive rehabilitation program, you will be expected to participate in at least 3 hours of rehabilitation therapies Monday-Friday, with modified therapy programming on the weekends.  Your rehabilitation program will include the following services:  Physical Therapy (PT), Occupational Therapy (OT), Speech Therapy (ST), 24 hour per day rehabilitation nursing, Therapeutic Recreaction (TR), Neuropsychology, Care Coordinator, Rehabilitation Medicine, Nutrition Services, Pharmacy Services and Other  Weekly team conferences will be held on Wednesdays to discuss your progress.  Your Inpatient Rehabilitation Care Coordinator will talk with you frequently to get your input and to update you on team discussions.  Team conferences with you and your family in attendance may also be held.  Expected length of stay: 10-14 Days  Overall anticipated outcome: MOD I  Depending on your progress and recovery, your program may change. Your Inpatient Rehabilitation Care Coordinator will coordinate services and will keep you informed of any changes. Your Inpatient Rehabilitation Care Coordinator's name and contact numbers are listed  below.  The following services may also be recommended but are not provided by the [facility]:    [place] will be made to provide these services after discharge if needed.  Arrangements include referral to agencies that provide these services.  Your insurance has been verified to be:  [facility] Medicare Your primary doctor is:  NO PCP  Pertinent information will be shared with your  doctor and your insurance company.  Inpatient Rehabilitation Care Coordinator:  Erlene Quan, [place] or [phone]  Information discussed with and copy given to patient by: Dyanne Iha, 10/16/2019, 10:09 AM

## 2019-10-16 NOTE — Progress Notes (Signed)
Physical Therapy Session Note  Patient Details  Name: Melissa Holt MRN: [id number] Date of Birth: 07-28-1940  Today's Date: 10/16/2019 PT Individual Time: 0910-0950 and 1420-1449 PT Individual Time Calculation (min): 40 min and 29 min  Short Term Goals: Week 1:  PT Short Term Goal 1 (Week 1): Pt will negotiate 4 steps with B rails & CGA. PT Short Term Goal 2 (Week 1): Pt will ambulate 75 ft with LRAD & min assist. PT Short Term Goal 3 (Week 1): Pt will complete bed mobility with min assist. PT Short Term Goal 4 (Week 1): Pt will complete bed<>w/c with LRAD & CGA.  Skilled Therapeutic Interventions/Progress Updates:    Session 1: Pt received sitting in w/c and agreeable to therapy session. Pt reports need to use bathroom and get cleaned up at the sink for the day - pt declining waiting for OT session to perform bathing. Transported in/out of bathroom in w/c. Stand pivot w/c<>toilet using grab bars with CGA for steadying. Standing with CGA performed LB clothing management with cuing to integrate L UE into task. Continent of bladder and performed standing peri-care with L UE support on grab bar and CGA for steadying. Performed UB and LB bathing and dressing via sit<>stands at sink for UE support with CGA for steadying balance - total assist for washing back and feet, max assist for washing peri-area. Reports feeling "200% better after having washed up." Gait training ~76ft using RW with CGA/min assist for steadying - demos decreased L LE foot clearance during swing due to inadequate hip/knee flexion and lack of ankle DF as well as slight L knee hyperextension during stance, therapist cuing for improvement throughout. Pt left seated in w/c with needs in reach and seat belt alarm on.  Of note: Pt reports she has been having "chills" that run down the L side of her body and "pulls that whole side down towards the floor."   Session 2: Pt received sitting in w/c with her son, Konrad Dolores, present and pt  agreeable to therapy session though reporting some fatigue. Pt's son very motivating to her during the session. Sit<>stands using RW with CGA for steadying - demos correct UE placement on RW without cuing. Gait training ~39ft using RW with CGA progressed to constant min assist for balance - initially pt able to advance L LE during swing though continues to demo lack of adequate hip/knee flexion and lack of ankle DF but with increased distance L LE becomes fatigued requiring max assist to advance - demos increased difficulty with lateral/medial steps with L LE during turns. Therapist educated pt's son on her CLOF and pending L LE progression may require AFO for improved gait mechanics. Pt reports need to use bathroom. Transported in/out bathroom in w/c. Stand pivot w/c<>toilet using grab bar UE support with CGA/min assist for balance - standing with L UE support on grab bar and min assist for balance performed LB clothing management with min assist. Continent of bladder and bowels - performed standing peri-care with L UE support on grab bar and min assist for balance. Standing hand hygiene at sink with CGA for steadying. Repeated sit<>stands to/from w/c x8 reps with BHHA to increase B LE weightbearing with min assist for balance. Standing with RW L LE NMR via foot taps on/off 6" step with CGA/min assist for balance and max assist for lifting L LE as pt lacking adequate hip/knee flexion to perform full movement x10 reps. Pt left seated in w/c with needs in reach and seat  belt alarm on. Pt continues to report having the "chills."  Therapy Documentation Precautions:  Precautions Precautions: Fall Restrictions Weight Bearing Restrictions: No  Pain:   Session 1: Denies pain during session.  Session 2: Denies pain during session.    Therapy/Group: Individual Therapy  Tawana Scale , PT, DPT, CSRS  10/16/2019, 7:56 AM

## 2019-10-16 NOTE — Progress Notes (Signed)
Patient ID: Melissa Holt, female   DOB: 07/29/1940, 79 y.o.   MRN: [id number] Met with the patient to review role of the nurse CM and discuss educational needs for secondary stroke prevention and management of risks including HLD, HTN, non-smoking tobacco use and prediabetes. Discussed usual diet and recommendations for modifications. Patient reported that she did not have a PCP and before now did not feel it was necessary. Reports that she works 4(10 hour) days and can sit if she needed or get up and walk if she needed at her work. She is anxious to get back to her pre-admission schedule. Patient given handouts on information reviewed so she can review information with her family. Discussed DAPT x 3 weeks per MD then change to Plavix; reported she was taking a Bayer ASA since a previous TIA for which she did not need rehab or have any residual weakness. No other concerns noted at present. Margarito Liner

## 2019-10-17 ENCOUNTER — Inpatient Hospital Stay (HOSPITAL_COMMUNITY): Payer: Medicare Other | Admitting: Occupational Therapy

## 2019-10-17 ENCOUNTER — Inpatient Hospital Stay (HOSPITAL_COMMUNITY): Payer: Medicare Other

## 2019-10-17 ENCOUNTER — Inpatient Hospital Stay (HOSPITAL_COMMUNITY): Payer: Medicare Other | Admitting: Physical Therapy

## 2019-10-17 ENCOUNTER — Encounter (HOSPITAL_COMMUNITY): Payer: Self-pay | Admitting: Physical Medicine & Rehabilitation

## 2019-10-17 LAB — GLUCOSE, CAPILLARY
Glucose-Capillary: 86 mg/dL (ref 70–99)
Glucose-Capillary: 83 mg/dL (ref 70–99)
Glucose-Capillary: 140 mg/dL — ABNORMAL HIGH (ref 70–99)
Glucose-Capillary: 152 mg/dL — ABNORMAL HIGH (ref 70–99)

## 2019-10-17 MED ORDER — TROLAMINE SALICYLATE 10 % EX CREA
TOPICAL_CREAM | Freq: Two times a day (BID) | CUTANEOUS | Status: DC | PRN
  Filled 2019-10-17: qty 85

## 2019-10-17 MED ORDER — MUSCLE RUB 10-15 % EX CREA
TOPICAL_CREAM | Freq: Two times a day (BID) | CUTANEOUS | Status: DC | PRN
  Filled 2019-10-17: qty 85

## 2019-10-17 NOTE — Progress Notes (Signed)
[place] PHYSICAL MEDICINE & REHABILITATION PROGRESS NOTE   Subjective/Complaints: No new issues, c/o feeling cold and feet swelling, no SOB, mild upper back pain ROS:  Pt denies SOB, abd pain, CP, negative for nausea vomiting diarrhea   Objective:   No results found. Recent Labs    10/15/19 0529  WBC 10.5  HGB 13.0  HCT 40.3  PLT 204   No results for input(s): NA, K, CL, CO2, GLUCOSE, BUN, CREATININE, CALCIUM in the last 72 hours.  Intake/Output Summary (Last 24 hours) at 10/17/2019 0916 Last data filed at 10/17/2019 0700 Gross per 24 hour  Intake 356 ml  Output --  Net 356 ml     Physical Exam: Vital Signs Blood pressure (!) 151/64, pulse 60, temperature 98.6 F (37 C), resp. rate 15, height $RemoveBe'4\' 11"'ceUBAPlKq$  (1.499 m), weight 66.1 kg, SpO2 96 %.  General: No acute distress Mood and affect are appropriate Heart: Regular rate and rhythm no rubs murmurs or extra sounds Lungs: Clear to auscultation, breathing unlabored, no rales or wheezes Abdomen: Positive bowel sounds, soft nontender to palpation, nondistended Extremities: No clubbing, cyanosis, or edema Skin: No evidence of breakdown, no evidence of rash Neurologic: Cranial nerves II through XII intact, motor strength is 5/5 in right and 4/5 left deltoid, bicep, tricep, grip, hip flexor, knee extensors, ankle dorsiflexor and plantar flexor Decreased fine motor left upper extremity Musculoskeletal: Full range of motion in all 4 extremities. No joint swelling, mild tenderness Bilateral infraspinatus fossa Ext trace bilat pedal edema L>R, no MTP swelling or tenderness, full painfree Foot and ankle ROM Assessment/Plan: 1. Functional deficits secondary to acute/subacute CVA with chronic lacunar infarcts which require 3+ hours per day of interdisciplinary therapy in a comprehensive inpatient rehab setting.  Physiatrist is providing close team supervision and 24 hour management of active medical problems listed below.  Physiatrist  and rehab team continue to assess barriers to discharge/monitor patient progress toward functional and medical goals  Care Tool:  Bathing    Body parts bathed by patient: Left arm, Chest, Abdomen, Front perineal area, Right upper leg, Left upper leg, Face   Body parts bathed by helper: Right lower leg, Left lower leg, Right arm, Buttocks     Bathing assist Assist Level: Minimal Assistance - Patient > 75%     Upper Body Dressing/Undressing Upper body dressing   What is the patient wearing?: Pull over shirt    Upper body assist Assist Level: Minimal Assistance - Patient > 75%    Lower Body Dressing/Undressing Lower body dressing      What is the patient wearing?: Underwear/pull up, Pants     Lower body assist Assist for lower body dressing: Moderate Assistance - Patient 50 - 74%     Toileting Toileting    Toileting assist Assist for toileting: Moderate Assistance - Patient 50 - 74%     Transfers Chair/bed transfer  Transfers assist     Chair/bed transfer assist level: Contact Guard/Touching assist Chair/bed transfer assistive device: Programmer, multimedia   Ambulation assist      Assist level: Minimal Assistance - Patient > 75% Assistive device: Walker-rolling Max distance: 13ft   Walk 10 feet activity   Assist     Assist level: Minimal Assistance - Patient > 75% Assistive device: Walker-rolling   Walk 50 feet activity   Assist Walk 50 feet with 2 turns activity did not occur: Safety/medical concerns  Assist level: Minimal Assistance - Patient > 75% Assistive device: Walker-rolling  Walk 150 feet activity   Assist Walk 150 feet activity did not occur: Safety/medical concerns         Walk 10 feet on uneven surface  activity   Assist Walk 10 feet on uneven surfaces activity did not occur: Safety/medical concerns         Wheelchair     Assist Will patient use wheelchair at discharge?: No (No PT long term goals  ) Type of Wheelchair: Manual    Wheelchair assist level: Supervision/Verbal cueing Max wheelchair distance: 30 ft    Wheelchair 50 feet with 2 turns activity    Assist    Wheelchair 50 feet with 2 turns activity did not occur: Safety/medical concerns       Wheelchair 150 feet activity     Assist  Wheelchair 150 feet activity did not occur: Safety/medical concerns       Blood pressure (!) 151/64, pulse 60, temperature 98.6 F (37 C), resp. rate 15, height $RemoveBe'4\' 11"'yWFeaoCro$  (1.499 m), weight 66.1 kg, SpO2 96 %.  Medical Problem List and Plan: 1.  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 Formulized individual treatment plan based on medical history, underlying problem and comorbidities.  2.  Antithrombotics: -DVT/anticoagulation:  Pharmaceutical: Lovenox             -antiplatelet therapy: DAPT x3 weeks followed by Plavix alone 3. Pain Management: Tylenol prn. Kpad and sports cream for myofascial pain upper back  4. Mood: LCSW to follow for evaluation and support.              -antipsychotic agents: N/A 5. Neuropsych: This patient is capable of making decisions on her own behalf. 6. Skin/Wound Care: Routine pressure-relief measures 7. Fluids/Electrolytes/Nutrition: Monitor intake/output. Electrolytes stable on 9/3. Check electrolytes in a.m. 8. HTN: Monitor blood pressure 3 times daily. Continue amlodipine. 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 10. Obstipation: Has resolved and is refusing  laxative--multiple BMs 09/01 and BP on 9/3. Will encourage miralax daily.    11. Prediabetes: Hgb A1c-5.8. Educate on appropriate diet  12. Leukocytosis resolved  9/4- likely Prednisone is cause of Leukocytosis, afebrile, negative chest x-ray, negative UA 13. GERD/Reflux Sx's  9/4- SLP said pt belching and having a lot of reflux Sx's- asked [place] to start PPI- was started.  14. Prerenal azotemia  9/4- BUN 27- Cr 0.94- will encourage PO  9/5- BUN down to 24- drinking a little better    LOS: 5 days A FACE TO FACE EVALUATION WAS PERFORMED  Charlett Blake 10/17/2019, 9:16 AM

## 2019-10-17 NOTE — Progress Notes (Signed)
Patient ID: Melissa Holt, female   DOB: Jul 05, 1940, 79 y.o.   MRN: [id number]  Team Conference Report to Patient/Family  Team Conference discussion was reviewed with the patient and caregiver, including goals, any changes in plan of care and target discharge date.  Patient and caregiver express understanding and are in agreement.  The patient has a target discharge date of 10/26/19.  Dyanne Iha 10/17/2019, 1:49 PM

## 2019-10-17 NOTE — Patient Care Conference (Signed)
Inpatient RehabilitationTeam Conference and Plan of Care Update Date: 10/17/2019   Time: 10:47 AM    Patient Name: Melissa Holt      Medical Record Number: [id number]  Date of Birth: 1941/01/19 Sex: Female         Room/Bed: 4W02C/4W02C-01 Payor Info: Payor: /    Admit Date/Time:  10/12/2019  4:35 PM  Primary Diagnosis:  Stroke [facility])  Hospital Problems: Active Problems:   * No active hospital problems. *    Expected Discharge Date: Expected Discharge Date: 10/26/19  Team Members Present: Physician leading conference: Dr. Alysia Penna Care Coodinator Present: Dorien Chihuahua, RN, BSN, CRRN;Christina Sampson Goon, [place] Nurse Present: [name], RN PT Present: Barrie Folk, PT OT Present: Clyda Greener, OT SLP Present: Jettie Booze, CF-SLP PPS Coordinator present : Ileana Ladd, Burna Mortimer, SLP     Current Status/Progress Goal Weekly Team Focus  Bowel/Bladder   incontinent of bladder at times, coninent of bowel, LBM 10/16/19  less incontinence episodes  assidt as needed & monitor   Swallow/Nutrition/ Hydration   supervision A  mod I least restrictive diet  supervision with meals, assess toleration of diet   ADL's   Supervision for UB selfcare with min assist for LB selfcare sit to stand.  Transfers to the toilet with min assist using the RW for support.  LUE function impriving and currently at a diminshed level overall.  modified independent  selfcare retraining, transfer training, neuromuscular re-education, balance retraining, DME education, pt education, therapeutic exercise   Mobility   min assist bed mobility, min assist sit<>stand and stand pivot transfers using RW, min assist gait up to 34ft using RW with pt requiring max assist for L LE advancement towards end, mod assist 4 stair navigation using B HRs  supervision/mod-I  activity tolerance, B LE strengthening, L LE NMR, transfer training, gait training, pt education, standing balance, stair navigation    Communication             Safety/Cognition/ Behavioral Observations  min A  mod I complex level, supervision memory  complex level problem solving, awareness, memory   Pain   c/o numbness to the top of her feet, no c/o pain  pain scale <2/10  assess & treat as needed   Skin   bruising to the left anterior forearm from old IV site  no new areas of skin breakdown  assess Q shift     Discharge Planning:  Goal to discharge home. Family will rotate to provide 24/7 care, son building ramp enterance   Team Discussion: Right foot pain with undetermined cause. Left UE movement although continue to note shoulder weakness. Fatigue in left leg.  Patient on target to meet rehab goals: yes, Mod I goals set except supervision for memory and steps  *See Care Plan and progress notes for long and short-term goals.   Revisions to Treatment Plan:   Teaching Needs: Transfers, toileting, medications, etc  Current Barriers to Discharge: Decreased caregiver support, access to home environment,   Possible Resolutions to Barriers: Son and daughter to arrange 24/7 care Ramp for entry to home (4 step entry)     Medical Summary Current Status: mild cognitive issues, Left lower ext weakness  Barriers to Discharge: Medical stability;Home enviroment access/layout   Possible Resolutions to [employer]: work on balance, endurance , pain complaints   Continued Need for Acute Rehabilitation Level of Care: The patient requires daily medical management by a physician with specialized training in physical medicine and rehabilitation for the following  reasons: Direction of a multidisciplinary physical rehabilitation program to maximize functional independence : Yes Medical management of patient stability for increased activity during participation in an intensive rehabilitation regime.: Yes Analysis of laboratory values and/or radiology reports with any subsequent need for medication adjustment  and/or medical intervention. : Yes   I attest that I was present, lead the team conference, and concur with the assessment and plan of the team.   Dorien Chihuahua B 10/17/2019, 4:36 PM

## 2019-10-17 NOTE — Progress Notes (Signed)
Speech Language Pathology Daily Session Note  Patient Details  Name: Melissa Holt MRN: [id number] Date of Birth: 22-Jul-1940  Today's Date: 10/17/2019 SLP Individual Time: 1330-1430 SLP Individual Time Calculation (min): 60 min  Short Term Goals: Week 1: SLP Short Term Goal 1 (Week 1): Patient will consume current diet without overt s/s of aspiration with Mod I for use of swallowing compensatory strategies. SLP Short Term Goal 2 (Week 1): Patient will demonstrate functional problem solving for mildly complex tasks with supervision verbal cues. SLP Short Term Goal 3 (Week 1): Patient will recall new, daily information with Min A verbal and visual cues.  Skilled Therapeutic Interventions:Skilled ST services focused on cognitive skills. Pt demonstrated recall of yesterday's ST events with initial supervision A verbal cues. Pt did not consume medication on a regular basis prior to admission, therefore SLP provided medication management education. Pt demonstrated recall of medication name/function and times per day with min A verbal cues fade to mod I for use of written list. SLP facilitated functional problem solving skills with personal medication task using once a day pill organizer. Pt demonstrated x2 errors however were self corrected mod I. SLP facilitated conversation pertaining to money management and pt appeared to have a functional system for money management, including a set income and paying bills via money orders the first of the month. SLP provided education pertaining to healthy diet choices and provided references in pt's stroke notebook. All questions were answered to staisfaction and SLP recommends nursing continues diet education to prevent occurrence of CVAs. SLP also recommends grocery store simulation task in upcoming sessions with a set budge for healthy options. Pt was left in room with call bell within reach and chair alarm set. SLP recommends to continue skilled services.      Pain Pain Assessment Pain Scale: Faces Pain Score: 0-No pain  Therapy/Group: Individual Therapy  Harpreet Pompey  [facility] 10/17/2019, 2:32 PM

## 2019-10-17 NOTE — Progress Notes (Signed)
Occupational Therapy Session Note  Patient Details  Name: Melissa Holt MRN: [id number] Date of Birth: [birth year]  Today's Date: 10/17/2019 OT Individual Time: 2841-3244 OT Individual Time Calculation (min): 54 min    Short Term Goals: Week 1:  OT Short Term Goal 1 (Week 1): Pt will complete LB dressing using AE, prn, with min assist. OT Short Term Goal 2 (Week 1): Pt will complete toilet transfer with supervision and no more than min cues for sequencing/safety. OT Short Term Goal 3 (Week 1): Pt will complete bathing with supervision and no more than min cues for sequencing/safety.  Skilled Therapeutic Interventions/Progress Updates:    Pt completed work on standing balance and LUE coordination with use of the [employer].  She was able to complete several intervals of standing with min guard assist while having to isolate the left index finger for pushing out the lights.  She was able to complete the first 2 intervals of 1 min with an average time of 3 and then 2 seconds.  The next times were completed with a set time of 3 seconds for each light and one minute duration.  She was able to complete 17/25,13/24, 22/28, and 25/28.  Returned to the room via wheelchair to finish session with pt left working on picking up her small foam pieces one at a time until 4-5 were in her hand.  Then she would work on manipulating them out to her fingertips to place in the container.  Occasional drops noted with pt knocking the cup over on one occasion.  Call button and phone in reach with safety belt alarm in place.    Therapy Documentation Precautions:  Precautions Precautions: Fall Restrictions Weight Bearing Restrictions: No  Pain: Pain Assessment Pain Scale: Faces Pain Score: 0-No pain ADL: See Care Tool Section for some details of mobility and selfcare  Therapy/Group: Individual Therapy  Lukis Bunt OTR/L 10/17/2019, 12:04 PM

## 2019-10-17 NOTE — Progress Notes (Signed)
Physical Therapy Session Note  Patient Details  Name: Melissa Holt MRN: [id number] Date of Birth: 1940-10-25  Today's Date: 10/17/2019 PT Individual Time: 1630-1700 PT Individual Time Calculation (min): 30 min   Short Term Goals: Week 1:  PT Short Term Goal 1 (Week 1): Pt will negotiate 4 steps with B rails & CGA. PT Short Term Goal 2 (Week 1): Pt will ambulate 75 ft with LRAD & min assist. PT Short Term Goal 3 (Week 1): Pt will complete bed mobility with min assist. PT Short Term Goal 4 (Week 1): Pt will complete bed<>w/c with LRAD & CGA.  Skilled Therapeutic Interventions/Progress Updates:    Pt received seated in bed, agreeable with encouragement to participate in therapy session. Pt reports some soreness in R upper back/shoulder region. Utilized kpad at end of session for pain management. Supine to sit with min A for LLE management. Sit to stand with min A to RW. Pt then reports urge to use the bathroom. Ambulation into bathroom with RW and min A, decreased gait speed, flexed trunk, and decreased DF and LE clearance during gait. Toilet transfer with min A, pt is independent for clothing management and pericare. See Flowsheet for details of void. Ambulation back to bed with RW and min A. Sit to supine mod A for BLE management. Pt left semi-reclined in bed with needs in reach, kpad in place at end of session.  Therapy Documentation Precautions:  Precautions Precautions: Fall Restrictions Weight Bearing Restrictions: No    Therapy/Group: Individual Therapy   Excell Seltzer, PT, DPT  10/17/2019, 5:07 PM

## 2019-10-17 NOTE — Progress Notes (Signed)
Physical Therapy Session Note  Patient Details  Name: Melissa Holt MRN: [id number] Date of Birth: Aug 06, 1940  Today's Date: 10/17/2019 PT Individual Time: 0370-9643 PT Individual Time Calculation (min): 28 min   Short Term Goals: Week 1:  PT Short Term Goal 1 (Week 1): Pt will negotiate 4 steps with B rails & CGA. PT Short Term Goal 2 (Week 1): Pt will ambulate 75 ft with LRAD & min assist. PT Short Term Goal 3 (Week 1): Pt will complete bed mobility with min assist. PT Short Term Goal 4 (Week 1): Pt will complete bed<>w/c with LRAD & CGA.  Skilled Therapeutic Interventions/Progress Updates: Pt presents supine in bed and states "just got into bed".  Pt convinced to participate in therapy and pleasant throughout.  Pt required CGA for sup to sit transfers using side rail.  Pt required management of covers to clear toes LLE.  Pt sat EOB for donning of shoes by PT.  Pt transfers sit to stand multiple trials for gait w/ CGA.  Pt amb multiple trials w/ RW and CGA for 60' including turns to return to bed.  Pt required verbal cues for posture and manual facilitation to weight shift for advancement of LLE.  Pt able to advance LLE appropriately w/ step length approx. 30% of time.  Pt w/ toe drag each tep, but able to advance w/ either self weight-shift w/ cueing or PT facilitation.  Pt returned to bed sit to supine w/ mod A and bed alarm on w/ all needs in reach.      Therapy Documentation Precautions:  Precautions Precautions: Fall Restrictions Weight Bearing Restrictions: No General:   Vital Signs: Therapy Vitals Temp: 98 F (36.7 C) Temp Source: Oral Pulse Rate: 75 Resp: 16 BP: (!) 123/55 Patient Position (if appropriate): Sitting Oxygen Therapy SpO2: 99 % O2 Device: Room Air Pain: no c/o pain. Pain Assessment Pain Score: 0-No pain Mobility:    Therapy/Group: Individual Therapy  Ladoris Gene 10/17/2019, 4:06 PM

## 2019-10-17 NOTE — IPOC Note (Signed)
Overall Plan of Care [facility]) Patient Details Name: Melissa Holt MRN: [id number] DOB: 1940-10-13  Admitting Diagnosis: Stroke [facility])  Hospital Problems: Active Problems:   * No active hospital problems. *     Functional Problem List: Nursing Bladder, Bowel, Endurance, Medication Management, Pain, Safety, Skin Integrity  PT Balance, Behavior, Safety, Endurance, Motor, Pain  OT Balance, Perception, Safety, Sensory, Behavior, Cognition, Endurance, Motor  SLP Cognition  TR         Basic ADLs: OT Grooming, Bathing, Dressing, Toileting     Advanced  ADLs: OT Simple Meal Preparation     Transfers: PT Bed Mobility, Bed to Chair, Car, [employer]  OT Toilet     Locomotion: PT Ambulation, Emergency planning/management officer, Stairs     Additional Impairments: OT Fuctional Use of Upper Extremity  SLP Swallowing, Social Cognition   Problem Solving, Memory  TR      Anticipated Outcomes Item Anticipated Outcome  Self Feeding n/a  Swallowing  Mod I   Basic self-care  Mod I  Toileting  Mod I   Bathroom Transfers Mod I  Bowel/Bladder  Will be contitnent with Min assist  Transfers  supervision with LRAD  Locomotion  supervision with LRAD  Communication     Cognition  Mod I  Pain  Pain less than 3 on 0-10 pain scale.  Safety/Judgment  patient will have no falls this admission   Therapy Plan: PT Intensity: Minimum of 1-2 x/day ,45 to 90 minutes PT Frequency: 5 out of 7 days PT Duration Estimated Length of Stay: 1.5-2 weeks OT Intensity: Minimum of 1-2 x/day, 45 to 90 minutes OT Frequency: 5 out of 7 days OT Duration/Estimated Length of Stay: 10-14 days SLP Intensity: Minumum of 1-2 x/day, 30 to 90 minutes SLP Frequency: 3 to 5 out of 7 days SLP Duration/Estimated Length of Stay: 10-14 days   Due to the current state of emergency, patients may not be receiving their 3-hours of Medicare-mandated therapy.   Team Interventions: Nursing Interventions Patient/Family Education,  Bladder Management, Bowel Management, Disease Management/Prevention, Pain Management, Medication Management, Psychosocial Support, Discharge Planning, Skin Care/Wound Management  PT interventions Ambulation/gait training, Cognitive remediation/compensation, Discharge planning, DME/adaptive equipment instruction, Functional mobility training, Pain management, Psychosocial support, Splinting/orthotics, Therapeutic Activities, UE/LE Strength taining/ROM, UE/LE Coordination activities, Wheelchair propulsion/positioning, Therapeutic Exercise, Stair training, Skin care/wound management, Patient/family education, Neuromuscular re-education, Disease management/prevention, Functional electrical stimulation, Community reintegration, Training and development officer  OT Interventions Training and development officer, Discharge planning, Functional electrical stimulation, Pain management, Therapeutic Activities, Self Care/advanced ADL retraining, UE/LE Coordination activities, Cognitive remediation/compensation, Disease mangement/prevention, Functional mobility training, Patient/family education, Skin care/wound managment, Therapeutic Exercise, Visual/perceptual remediation/compensation, Academic librarian, Engineer, drilling, Neuromuscular re-education, Psychosocial support, Splinting/orthotics, UE/LE Strength taining/ROM, Wheelchair propulsion/positioning  SLP Interventions Cognitive remediation/compensation, Dysphagia/aspiration precaution training, Internal/external aids, Therapeutic Activities, Environmental controls, Cueing hierarchy, Functional tasks, Patient/family education  TR Interventions    SW/CM Interventions Discharge Planning, Psychosocial Support, Patient/Family Education   Barriers to Discharge MD  Medical stability, Home enviroment access/loayout and Lack of/limited family support  Nursing Medication compliance, Decreased caregiver support    PT Lack of/limited family support unsure if  family can provide supervision at d/c  OT Decreased caregiver support    SLP      SW Inaccessible home environment Son building ramp   Team Discharge Planning: Destination: PT-Home ,OT- Home , SLP-Home Projected Follow-up: PT-24 hour supervision/assistance (HHPT vs OPPT), OT-  Home health OT, SLP-Home Health SLP Projected Equipment Needs: PT-To be determined, OT- To be determined, SLP-None recommended by SLP  Equipment Details: PT- , OT-  Patient/family involved in discharge planning: PT- Patient,  OT-Patient, SLP-Patient  MD ELOS: 10-14d Medical Rehab Prognosis:  Good Assessment:  45 female with history of colon cancer--no medical care since colon surgery and had been in relatively good health till fall 1 week prior to admission. She developed bilateral hip pain then started having difficulty standing due to BLE weakness. She was found to have LLE weakness and was transferred to [facility] on 10/05/2019 for work-up and management. MRI brain was negative for acute changes. MRI lumbar and thoracic spine revealed acute mild endplate T12 fracture without stenosis and L5-S1 anterolisthesis with subarticular stenosis. MRI pelvis showed normal lumbosacral plexus with mild bilateral hip OA. 2D echo showed EF of 60 to 65% with moderate LVH and mild MVR. Carotid Dopplers were negative for ICA stenosis and showed incidental line large heterogeneous right thyroid.  She started developing progressive symptoms on 08/30 with LUE weakness and mild facial asymmetry. MRI brain done revealing 2 mm acute/early subacute infarct within right ventral medulla, chronic small vessel infarcts and moderate generalized parenchymal atrophy. 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 MRI C-spine showed mild canal stenosis C3-C4 and C4-C5 and neural  foraminal stenosis. Dr. Leonie Man felt that stroke secondary to small vessel disease with resultant LLE weakness and recommended DAPT x3 weeks followed by Plavix alone. Outpatient follow-up recommended for small cerebral aneurysms. Dr. Marcello Moores evaluated spine films and felt that no structural spine disease responsible for leg weakness and to follow-up on outpatient basis for nonsurgical therapies.  Now requiring 24/7 Rehab RN,MD, as well as CIR level PT, OT and SLP.  Treatment team will focus on ADLs and mobility with goals set at Sup  See Team Conference Notes for weekly updates to the plan of care

## 2019-10-18 ENCOUNTER — Inpatient Hospital Stay (HOSPITAL_COMMUNITY): Payer: Medicare Other

## 2019-10-18 ENCOUNTER — Inpatient Hospital Stay (HOSPITAL_COMMUNITY): Payer: Medicare Other | Admitting: Occupational Therapy

## 2019-10-18 LAB — GLUCOSE, CAPILLARY
Glucose-Capillary: 100 mg/dL — ABNORMAL HIGH (ref 70–99)
Glucose-Capillary: 105 mg/dL — ABNORMAL HIGH (ref 70–99)
Glucose-Capillary: 134 mg/dL — ABNORMAL HIGH (ref 70–99)
Glucose-Capillary: 144 mg/dL — ABNORMAL HIGH (ref 70–99)

## 2019-10-18 NOTE — Progress Notes (Signed)
Physical Therapy Session Note  Patient Details  Name: Melissa Holt MRN: [id number] Date of Birth: 1940/05/28  Today's Date: 10/18/2019 PT Individual Time: 1300-1415 PT Individual Time Calculation (min): 75 min   Short Term Goals: Week 1:  PT Short Term Goal 1 (Week 1): Pt will negotiate 4 steps with B rails & CGA. PT Short Term Goal 2 (Week 1): Pt will ambulate 75 ft with LRAD & min assist. PT Short Term Goal 3 (Week 1): Pt will complete bed mobility with min assist. PT Short Term Goal 4 (Week 1): Pt will complete bed<>w/c with LRAD & CGA.  Skilled Therapeutic Interventions/Progress Updates:    Patient received sitting in wc, agreeable to PT. She denies pain. Patient able to ambulate from wc to bathroom with CGA-MinA. Increased L foot clearance noted. Patient ambulating 4x15 ft with CGA-MinA (for turns) and seated rest breaks between bouts. L knee hyperextension noted during terminal stance and decreased L foot clearance through swing as patient fatigued. PT trialing L Ottoblock AFO to assist with foot clearance and knee hyperextension with mild-moderate improvement. Patient completing high intensity gait tx on LiteGait over treadmill: 33mins, 0.5 mph, 48ft with ModA to advance L LE; 75mins, 0.68mph, 61ft, MinA to advance L LE; 90mins 0.50mph, 57ft, intermittent assist to increase step length to L LE. Patient completing 8 mins on NuStep for increased reciprocal movements to B LE and improved mm endurance. Patient returning to room in wc, seatbelt alarm on, call light within reach.   Therapy Documentation Precautions:  Precautions Precautions: Fall Restrictions Weight Bearing Restrictions: No    Therapy/Group: Individual Therapy  Karoline Caldwell, PT, DPT, CBIS 10/18/2019, 7:49 AM

## 2019-10-18 NOTE — Progress Notes (Signed)
Speech Language Pathology Daily Session Note  Patient Details  Name: Melissa Holt MRN: [id number] Date of Birth: December 30, 1940  Today's Date: 10/18/2019 SLP Individual Time: 0830-0930 SLP Individual Time Calculation (min): 60 min  Short Term Goals: Week 1: SLP Short Term Goal 1 (Week 1): Patient will consume current diet without overt s/s of aspiration with Mod I for use of swallowing compensatory strategies. SLP Short Term Goal 2 (Week 1): Patient will demonstrate functional problem solving for mildly complex tasks with supervision verbal cues. SLP Short Term Goal 3 (Week 1): Patient will recall new, daily information with Min A verbal and visual cues.  Skilled Therapeutic Interventions:Skilled ST services focused on cognitive skills. Pt demonstrated excellent delayed recall of 5 out 5 medication functions without use of visual aid, learned in yesterday's session. Pt supports cognitive skills appear at baseline function, therefore SLP reassessed with SLUMS. Pt scored 24 out 30 (n=>27), improvement from evaluation date with a score of 22 out 30. Pt continued to demonstrate deficits in memory, error awareness and higher level problem solving. Pt refused to participate in presented calendar and grocery store task with monetary constraints, pt supported utilizing basic system at home and task did not directly reflect personal set up. Pt appeared to demonstrated appropriate verbal problem solving pertaining to managing appointments and budgeting for meals during informal conversation. SLP facilitated semi-complex verbal problem solving skills utilizing questions from ALFA, pt demonstrated accuracy in 4 out 5 opportunities when utilizing pen/paper to assist in working memory and mod A verbal cues for error awareness. Pt was left in room with call bell within reach and bed alarm set. SLP recommends to continue skilled services.     Pain Pain Assessment Pain Scale: Faces Pain Score: 0-No  pain  Therapy/Group: Individual Therapy  Daun Rens  [facility] 10/18/2019, 2:49 PM

## 2019-10-18 NOTE — Progress Notes (Signed)
Occupational Therapy Session Note  Patient Details  Name: Melissa Holt MRN: [id number] Date of Birth: 10/23/1940  Today's Date: 10/18/2019 OT Individual Time: 1009-1105 OT Individual Time Calculation (min): 56 min    Short Term Goals: Week 1:  OT Short Term Goal 1 (Week 1): Pt will complete LB dressing using AE, prn, with min assist. OT Short Term Goal 2 (Week 1): Pt will complete toilet transfer with supervision and no more than min cues for sequencing/safety. OT Short Term Goal 3 (Week 1): Pt will complete bathing with supervision and no more than min cues for sequencing/safety.  Skilled Therapeutic Interventions/Progress Updates:    Pt completed bathing and dressing sit to stand at the sink.  She was able to perform all UB selfcare at supervision level.  LB bathing was completed at min assist level sit to stand.  Therapist provided step stool for her to prop her LEs on to assist with reaching her feet to wash then.  She was able to donn her underpants and pants with min assist sit to stand as well, with only slight assist to pull the tighter pants up in the back.  She was then able to donn her socks with increased time and min guard assist as not to slide out of the front of the wheelchair.  She donned her shoes with min assist but insisted on standing to put her left heel in the shoe.  Discussed that this will not be safe at home and therapist discussed other options to try next visit such as a LH sponge and a shoe funnel.  Finished session with pt completing oral hygiene with setup and then resting in the wheelchair with the call button and phone in reach and safety belt in place.    Therapy Documentation Precautions:  Precautions Precautions: Fall Restrictions Weight Bearing Restrictions: No   Pain: Pain Assessment Pain Scale: Faces Pain Score: 0-No pain ADL: See Care Tool Section for some details of mobility and selfcare  Therapy/Group: Individual Therapy  Shaden Higley  OTR/L 10/18/2019, 12:27 PM

## 2019-10-18 NOTE — Progress Notes (Addendum)
Patient ID: Melissa Holt, female   DOB: November 20, 1940, 79 y.o.   MRN: [id number]   SW received call from [facility]. Facility is anticipating accepting patient, just wants to confirm there will be a [facility] therapist available in pt's area. Will confirm by the end of the day tomorrow.  ** Facility able to accept patient for [facility].

## 2019-10-18 NOTE — Progress Notes (Signed)
[place] PHYSICAL MEDICINE & REHABILITATION PROGRESS NOTE   Subjective/Complaints: Is getting into bed with nursing aide assistance.  Discussed discharge date, denies any new complaints today.  No pain complaints ROS:  Pt denies SOB, abd pain, CP, negative for nausea vomiting diarrhea   Objective:   No results found. No results for input(s): WBC, HGB, HCT, PLT in the last 72 hours. No results for input(s): NA, K, CL, CO2, GLUCOSE, BUN, CREATININE, CALCIUM in the last 72 hours.  Intake/Output Summary (Last 24 hours) at 10/18/2019 0837 Last data filed at 10/17/2019 1706 Gross per 24 hour  Intake 472 ml  Output --  Net 472 ml     Physical Exam: Vital Signs Blood pressure 119/70, pulse 60, temperature 98.5 F (36.9 C), resp. rate 16, height 4\' 11"  (1.499 m), weight 71.2 kg, SpO2 96 %.   General: No acute distress Mood and affect are appropriate Heart: Regular rate and rhythm no rubs murmurs or extra sounds Lungs: Clear to auscultation, breathing unlabored, no rales or wheezes Abdomen: Positive bowel sounds, soft nontender to palpation, nondistended Extremities: No clubbing, cyanosis, or edema Skin: No evidence of breakdown, no evidence of rash   Neurologic: Cranial nerves II through XII intact, motor strength is 5/5 in right and 4/5 left deltoid, bicep, tricep, grip, hip flexor, knee extensors, ankle dorsiflexor and plantar flexor Decreased fine motor left upper extremity Musculoskeletal: Full range of motion in all 4 extremities. No joint swelling, mild tenderness Bilateral infraspinatus fossa Ext trace bilat pedal edema L>R, no MTP swelling or tenderness, full painfree Foot and ankle ROM Assessment/Plan: 1. Functional deficits secondary to acute/subacute CVA with chronic lacunar infarcts which require 3+ hours per day of interdisciplinary therapy in a comprehensive inpatient rehab setting.  Physiatrist is providing close team supervision and 24 hour management of active  medical problems listed below.  Physiatrist and rehab team continue to assess barriers to discharge/monitor patient progress toward functional and medical goals  Care Tool:  Bathing    Body parts bathed by patient: Right arm, Left arm, Chest, Abdomen, Front perineal area, Buttocks, Right upper leg, Left upper leg, Right lower leg, Left lower leg, Face   Body parts bathed by helper: Right lower leg, Left lower leg, Right arm, Buttocks     Bathing assist Assist Level: Minimal Assistance - Patient > 75%     Upper Body Dressing/Undressing Upper body dressing   What is the patient wearing?: Pull over shirt    Upper body assist Assist Level: Minimal Assistance - Patient > 75%    Lower Body Dressing/Undressing Lower body dressing      What is the patient wearing?: Underwear/pull up, Pants     Lower body assist Assist for lower body dressing: Moderate Assistance - Patient 50 - 74%     Toileting Toileting    Toileting assist Assist for toileting: Minimal Assistance - Patient > 75%     Transfers Chair/bed transfer  Transfers assist     Chair/bed transfer assist level: Contact Guard/Touching assist Chair/bed transfer assistive device: Programmer, multimedia   Ambulation assist      Assist level: Minimal Assistance - Patient > 75% Assistive device: Walker-rolling Max distance: 60   Walk 10 feet activity   Assist     Assist level: Minimal Assistance - Patient > 75% Assistive device: Walker-rolling   Walk 50 feet activity   Assist Walk 50 feet with 2 turns activity did not occur: Safety/medical concerns  Assist level: Minimal Assistance -  Patient > 75% Assistive device: Walker-rolling    Walk 150 feet activity   Assist Walk 150 feet activity did not occur: Safety/medical concerns         Walk 10 feet on uneven surface  activity   Assist Walk 10 feet on uneven surfaces activity did not occur: Safety/medical concerns          Wheelchair     Assist Will patient use wheelchair at discharge?: No (No PT long term goals ) Type of Wheelchair: Manual    Wheelchair assist level: Supervision/Verbal cueing Max wheelchair distance: 30 ft    Wheelchair 50 feet with 2 turns activity    Assist    Wheelchair 50 feet with 2 turns activity did not occur: Safety/medical concerns       Wheelchair 150 feet activity     Assist  Wheelchair 150 feet activity did not occur: Safety/medical concerns       Blood pressure 119/70, pulse 60, temperature 98.5 F (36.9 C), resp. rate 16, height 4\' 11"  (1.499 m), weight 71.2 kg, SpO2 96 %.  Medical Problem List and Plan: 1.  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  Antithrombotics: -DVT/anticoagulation:  Pharmaceutical: Lovenox             -antiplatelet therapy: DAPT x3 weeks followed by Plavix alone 3. Pain Management: Tylenol prn. Kpad and sports cream for myofascial pain upper back  4. Mood: LCSW to follow for evaluation and support.              -antipsychotic agents: N/A 5. Neuropsych: This patient is capable of making decisions on her own behalf. 6. Skin/Wound Care: Routine pressure-relief measures 7. Fluids/Electrolytes/Nutrition: Monitor intake/output. Electrolytes stable on 9/3. Check electrolytes in a.m. 8. HTN: Monitor blood pressure 3 times daily. Continue amlodipine. 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 10. Obstipation: Has resolved and is refusing laxative--multiple BMs 09/01 and BP on 9/3. Will encourage miralax daily.    11. Prediabetes: Hgb A1c-5.8. Educate  on appropriate diet   12. GERD/Reflux Sx's  9/4- SLP said pt belching and having a lot of reflux Sx's- asked [place] to start PPI- was started.  Symptoms resolved 14. Prerenal azotemia    9/5- BUN down to 24- drinking a little better    LOS: 6 days A FACE TO FACE EVALUATION WAS PERFORMED  Charlett Blake 10/18/2019, 8:37 AM

## 2019-10-19 ENCOUNTER — Inpatient Hospital Stay (HOSPITAL_COMMUNITY): Payer: Medicare Other

## 2019-10-19 ENCOUNTER — Inpatient Hospital Stay (HOSPITAL_COMMUNITY): Payer: Medicare Other | Admitting: Occupational Therapy

## 2019-10-19 LAB — GLUCOSE, CAPILLARY
Glucose-Capillary: 93 mg/dL (ref 70–99)
Glucose-Capillary: 106 mg/dL — ABNORMAL HIGH (ref 70–99)
Glucose-Capillary: 260 mg/dL — ABNORMAL HIGH (ref 70–99)
Glucose-Capillary: 115 mg/dL — ABNORMAL HIGH (ref 70–99)

## 2019-10-19 NOTE — Progress Notes (Signed)
Occupational Therapy Weekly Progress Note  Patient Details  Name: Melissa Holt MRN: [id number] Date of Birth: 1940-10-18  Beginning of progress report period: October 13, 2019 End of progress report period: October 19, 2019  Today's Date: 10/19/2019 OT Individual Time: 1007-1219 OT Individual Time Calculation (min): 66 min    Patient has met 1 of 3 short term goals.  Pt is making steady progress with OT at this time.  She completes UB selfcare with supervision.  She completes LB bathing at min guard assist. She needs min assist for LB dressing sit to stand.  Toilet transfers are at min guard assist level for with use of the RW for support.  She continues to increase LUE functional coordination and use at a diminished level, using it for tying and pulling up clothing with increased time.  Feel her progress is great at this time and will recommend continued CIR level therapy until expected discharge on 9/17 at modified independent to supervision level.    Patient continues to demonstrate the following deficits: muscle weakness, impaired timing and sequencing, unbalanced muscle activation and decreased coordination, decreased memory and decreased standing balance, hemiplegia and decreased balance strategies and therefore will continue to benefit from skilled OT intervention to enhance overall performance with BADL.  Patient progressing toward long term goals..  Continue plan of care.  OT Short Term Goals Week 2:  OT Short Term Goal 1 (Week 2): Continue working on established LTGs set at modified independent to supervision.  Skilled Therapeutic Interventions/Progress Updates:    Pt worked on donning her shoes to start session.  She was able to donn them in the wheelchair with the foot rests in place secondary to height of the wheelchair being too tall.  Min assist was needed to donn the left shoe with use of the shoe funnel.  She donned her right shoe with setup.  Next, took her down to the  therapy gym via wheelchair.  She was able to complete Box and Blocks Test sitting on the mat.  RUE scored 44 blocks compared to 46 on the left.  She then worked on placing small round 1" pegs in the peg board with the LUE as well.  Emphasis on picking them up and manipulating them from fingertips to palm and back for placement.  She demonstrated slight difficulty with in-hand manipulation, dropping 1 out of every 10 pegs.  Finished session with transfer back to the wheelchair at min guard and return to the room.  She was left sitting up in the wheelchair with the call button and phone in reach and safety belt in place.    Therapy Documentation Precautions:  Precautions Precautions: Fall Restrictions Weight Bearing Restrictions: No  Pain: Pain Assessment Pain Scale: Faces Pain Score: 0-No pain ADL: See Care Tool Section for some details of mobility and selfcare  Therapy/Group: Individual Therapy  Andi Mahaffy OTR/L 10/19/2019, 4:36 PM

## 2019-10-19 NOTE — Progress Notes (Signed)
[place] PHYSICAL MEDICINE & REHABILITATION PROGRESS NOTE   Subjective/Complaints: No issues overnight, remembers me but cannot tell what my position is.  Oriented to person and rehab but not CVA ROS:  Pt denies SOB, abd pain, CP, negative for nausea vomiting diarrhea   Objective:   No results found. No results for input(s): WBC, HGB, HCT, PLT in the last 72 hours. No results for input(s): NA, K, CL, CO2, GLUCOSE, BUN, CREATININE, CALCIUM in the last 72 hours.  Intake/Output Summary (Last 24 hours) at 10/19/2019 0845 Last data filed at 10/19/2019 0734 Gross per 24 hour  Intake 476 ml  Output --  Net 476 ml     Physical Exam: Vital Signs Blood pressure 140/65, pulse 62, temperature 98.2 F (36.8 C), resp. rate 17, height 4\' 11"  (1.499 m), weight 71.2 kg, SpO2 98 %.    General: No acute distress Mood and affect are appropriate Heart: Regular rate and rhythm no rubs murmurs or extra sounds Lungs: Clear to auscultation, breathing unlabored, no rales or wheezes Abdomen: Positive bowel sounds, soft nontender to palpation, nondistended Extremities: No clubbing, cyanosis, or edema Skin: No evidence of breakdown, no evidence of rash   Neurologic: Cranial nerves II through XII intact, motor strength is 5/5 in right and 4/5 left deltoid, bicep, tricep, grip, hip flexor, knee extensors, ankle dorsiflexor and plantar flexor Decreased fine motor left upper extremity Musculoskeletal: Full range of motion in all 4 extremities. No joint swelling, mild tenderness Bilateral infraspinatus fossa Ext trace bilat pedal edema L>R, no MTP swelling or tenderness, full painfree Foot and ankle ROM Assessment/Plan: 1. Functional deficits secondary to acute/subacute CVA with chronic lacunar infarcts which require 3+ hours per day of interdisciplinary therapy in a comprehensive inpatient rehab setting.  Physiatrist is providing close team supervision and 24 hour management of active medical problems  listed below.  Physiatrist and rehab team continue to assess barriers to discharge/monitor patient progress toward functional and medical goals  Care Tool:  Bathing    Body parts bathed by patient: Right arm, Left arm, Chest, Abdomen, Front perineal area, Buttocks, Right upper leg, Left upper leg, Right lower leg, Left lower leg, Face   Body parts bathed by helper: Right lower leg, Left lower leg, Right arm, Buttocks     Bathing assist Assist Level: Minimal Assistance - Patient > 75%     Upper Body Dressing/Undressing Upper body dressing   What is the patient wearing?: Pull over shirt    Upper body assist Assist Level: Set up assist    Lower Body Dressing/Undressing Lower body dressing      What is the patient wearing?: Underwear/pull up, Pants     Lower body assist Assist for lower body dressing: Minimal Assistance - Patient > 75%     Toileting Toileting    Toileting assist Assist for toileting: Minimal Assistance - Patient > 75%     Transfers Chair/bed transfer  Transfers assist     Chair/bed transfer assist level: Contact Guard/Touching assist Chair/bed transfer assistive device: Programmer, multimedia   Ambulation assist      Assist level: Contact Guard/Touching assist Assistive device: Walker-rolling Max distance: 15   Walk 10 feet activity   Assist     Assist level: Contact Guard/Touching assist Assistive device: Walker-rolling   Walk 50 feet activity   Assist Walk 50 feet with 2 turns activity did not occur: Safety/medical concerns  Assist level: Minimal Assistance - Patient > 75% Assistive device: Walker-rolling  Walk 150 feet activity   Assist Walk 150 feet activity did not occur: Safety/medical concerns         Walk 10 feet on uneven surface  activity   Assist Walk 10 feet on uneven surfaces activity did not occur: Safety/medical concerns         Wheelchair     Assist Will patient use wheelchair  at discharge?: No (No PT long term goals ) Type of Wheelchair: Manual    Wheelchair assist level: Supervision/Verbal cueing Max wheelchair distance: 30 ft    Wheelchair 50 feet with 2 turns activity    Assist    Wheelchair 50 feet with 2 turns activity did not occur: Safety/medical concerns       Wheelchair 150 feet activity     Assist  Wheelchair 150 feet activity did not occur: Safety/medical concerns       Blood pressure 140/65, pulse 62, temperature 98.2 F (36.8 C), resp. rate 17, height 4\' 11"  (1.499 m), weight 71.2 kg, SpO2 98 %.  Medical Problem List and Plan: 1.  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  Antithrombotics: -DVT/anticoagulation:  Pharmaceutical: Lovenox             -antiplatelet therapy: DAPT x3 weeks followed by Plavix alone 3. Pain Management: Tylenol prn. Kpad and sports cream for myofascial pain upper back  4. Mood: LCSW to follow for evaluation and support.              -antipsychotic agents: N/A 5. Neuropsych: This patient is capable of making decisions on her own behalf. 6. Skin/Wound Care: Routine pressure-relief measures 7. Fluids/Electrolytes/Nutrition: Monitor intake/output. Electrolytes stable on 9/3. Check electrolytes in a.m. 8. HTN: Monitor blood pressure 3 times daily. Continue amlodipine. 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 10. Obstipation: Has resolved and is refusing laxative--multiple BMs 09/01 and BP on 9/3. Will encourage miralax daily.    11. Prediabetes: Hgb A1c-5.8. Educate on appropriate diet   12. GERD/Reflux Sx's  9/4- SLP said pt  belching and having a lot of reflux Sx's- asked [place] to start PPI- was started.  Symptoms resolved 14. Prerenal azotemia    9/5- BUN down to 24- drinking a little better, recheck 9/11    LOS: 7 days A FACE TO [place] E Olga Bourbeau 10/19/2019, 8:45 AM

## 2019-10-19 NOTE — Progress Notes (Signed)
Physical Therapy Session Note  Patient Details  Name: Melissa Holt MRN: [id number] Date of Birth: May 22, 1940  Today's Date: 10/19/2019 PT Individual Time: 0904-1002 PT Individual Time Calculation (min): 58 min   Short Term Goals: Week 1:  PT Short Term Goal 1 (Week 1): Pt will negotiate 4 steps with B rails & CGA. PT Short Term Goal 2 (Week 1): Pt will ambulate 75 ft with LRAD & min assist. PT Short Term Goal 3 (Week 1): Pt will complete bed mobility with min assist. PT Short Term Goal 4 (Week 1): Pt will complete bed<>w/c with LRAD & CGA.  Skilled Therapeutic Interventions/Progress Updates:    Patient received sitting up in wc agreeable to PT. She denies pain at this time. Patient able to remain standing for >2 mins with no UE support to complete L UE task crossing midline. Patient completed same task again on foam with no UE and required MinA-ModA to maintain balance. She displays posterior lean with no balance strategies. Patient completing retro stepping x3 laps in // bars + CGA-MinA to encourage larger steps backward for balance strategies. MinA required to facilitate weight shift to allow for adequate steps backwards. Patient able to ambulate x118ft with dorsiflexion assist ACE wrap + FWW + CGA. Verbal cues needed to encourage increased L knee flexion through swing phase of gait. Patient returning to room in chair, seatbelt alarm in place, call light within reach.   Therapy Documentation Precautions:  Precautions Precautions: Fall Restrictions Weight Bearing Restrictions: No    Therapy/Group: Individual Therapy  Karoline Caldwell, PT, DPT, CBIS 10/19/2019, 7:47 AM

## 2019-10-19 NOTE — Progress Notes (Signed)
Physical Therapy Weekly Progress Note  Patient Details  Name: Melissa Holt MRN: [id number] Date of Birth: 01/02/1941  Beginning of progress report period: October 13, 2019 End of progress report period: October 19, 2019   Patient has met 3 of 4 short term goals.  She is progressing well toward meeting stair negotiating goal, but does currently require MinA to complete 4 steps. She has progressed well with her gait goals and is able to ambulate a total of 186f with CGA and FWW. Variable L LE clearance, but patient is receptive to verbal and tactile cuing in order to improve her gait mechanics. She is able to complete all bed mobility with MinA and completes stand pivot transfers with CGA. She will benefit from further skilled interventions to improve safety and efficiency of gait mechanics as well as progress independence with standing balance, transfers and stair negotiation prior to dc home.   Patient continues to demonstrate the following deficits muscle weakness, decreased cardiorespiratoy endurance, impaired timing and sequencing, abnormal tone, unbalanced muscle activation and decreased coordination, decreased initiation and decreased problem solving and decreased standing balance, hemiplegia and decreased balance strategies and therefore will continue to benefit from skilled PT intervention to increase functional independence with mobility.  Patient progressing toward long term goals..  Continue plan of care.  PT Short Term Goals Week 1:  PT Short Term Goal 1 (Week 1): Pt will negotiate 4 steps with B rails & CGA. PT Short Term Goal 1 - Progress (Week 1): Progressing toward goal PT Short Term Goal 2 (Week 1): Pt will ambulate 75 ft with LRAD & min assist. PT Short Term Goal 2 - Progress (Week 1): Met PT Short Term Goal 3 (Week 1): Pt will complete bed mobility with min assist. PT Short Term Goal 3 - Progress (Week 1): Met PT Short Term Goal 4 (Week 1): Pt will complete bed<>w/c with  LRAD & CGA. PT Short Term Goal 4 - Progress (Week 1): Met Week 2:  PT Short Term Goal 1 (Week 2): STG= LTG due to ELOS    Therapy Documentation Precautions:  Precautions Precautions: Fall Restrictions Weight Bearing Restrictions: No     JDebbora Dus9/11/2019, 7:48 AM

## 2019-10-19 NOTE — Progress Notes (Signed)
Speech Language Pathology Weekly Progress and Session Note  Patient Details  Name: Melissa Holt MRN: [id number] Date of Birth: 09-12-1940  Beginning of progress report period: October 12, 2019 End of progress report period: October 19, 2019  Today's Date: 10/19/2019 SLP Individual Time: 1100-1200 SLP Individual Time Calculation (min): 60 min  Short Term Goals: Week 1: SLP Short Term Goal 1 (Week 1): Patient will consume current diet without overt s/s of aspiration with Mod I for use of swallowing compensatory strategies. SLP Short Term Goal 1 - Progress (Week 1): Not met SLP Short Term Goal 2 (Week 1): Patient will demonstrate functional problem solving for mildly complex tasks with supervision verbal cues. SLP Short Term Goal 2 - Progress (Week 1): Not met SLP Short Term Goal 3 (Week 1): Patient will recall new, daily information with Min A verbal and visual cues. SLP Short Term Goal 3 - Progress (Week 1): Met    New Short Term Goals: Week 2: SLP Short Term Goal 1 (Week 2): STG=LTG due to short ELOS  Weekly Progress Updates: Pt met 1 out 3 goals this reporting period. Swallowing goals were not addressed this reporting period due to focus on cognitive skills. SLP facilitated functional problem solving, and short term recall in medication management, conversation pertaining to money management and insurance task. Pt supports baseline cognitive skills and SLP re-administered SLUMs in which pt scored 24 out 30 (n=>27) 2 points higher than on evaluation. Pt continues to demonstrate reduced mental flexibility when asked to simulate a task that does not directly reflect personal set up. SLP suspects pt is nearing cognitive baseline, therefore downgraded ST service time with the possibility of early discharge from [place] to leaving the hospital. Education is on going with pt. PT would continue to benefit from skills ST services in order to maximize functional independence and reduce  burden of care, no f/u ST services needed.      Intensity: Minumum of 1-2 x/day, 30 to 90 minutes Frequency: 1 to 3 out of 7 days Duration/Length of Stay: 9/17 Treatment/Interventions: Cognitive remediation/compensation;Dysphagia/aspiration precaution training;Internal/external aids;Therapeutic Activities;Environmental controls;Cueing hierarchy;Functional tasks;Patient/family education   Daily Session  Skilled Therapeutic Interventions: Skilled ST services focused on cognitive skills. SLP attempted to assess tolerance of diet with lunch tray, however meal did not arrive in time. SLP facilitated complex problem solving, and recall skills utilizing functional insurance task, pt demonstrated ability to compare 6 options with 3 set parameters to select the most appropriate option. Pt required mod A verbal cues fade to min A verbal cues for problem solving and recall within task while using external aids. Pt was agreeable to begin semi-complex calendar task, however not completed due to time restraints. SLP believes pt is approaching baseline and will reduce skilled services with the possibility of an early discharge for ST services. Pt was left in room with call bell within reach and chair alarm set. ST recommends to continue skilled ST services.      General    Pain Pain Assessment Pain Score: 0-No pain  Therapy/Group: Individual Therapy  Gerlene Glassburn  [facility] 10/19/2019, 3:28 PM

## 2019-10-20 ENCOUNTER — Inpatient Hospital Stay (HOSPITAL_COMMUNITY): Payer: Medicare Other

## 2019-10-20 DIAGNOSIS — R7303 Prediabetes: Secondary | ICD-10-CM

## 2019-10-20 DIAGNOSIS — I639 Cerebral infarction, unspecified: Secondary | ICD-10-CM

## 2019-10-20 DIAGNOSIS — K5901 Slow transit constipation: Secondary | ICD-10-CM

## 2019-10-20 DIAGNOSIS — I10 Essential (primary) hypertension: Secondary | ICD-10-CM

## 2019-10-20 DIAGNOSIS — R739 Hyperglycemia, unspecified: Secondary | ICD-10-CM

## 2019-10-20 DIAGNOSIS — K219 Gastro-esophageal reflux disease without esophagitis: Secondary | ICD-10-CM

## 2019-10-20 DIAGNOSIS — R7309 Other abnormal glucose: Secondary | ICD-10-CM

## 2019-10-20 DIAGNOSIS — T380X5A Adverse effect of glucocorticoids and synthetic analogues, initial encounter: Secondary | ICD-10-CM

## 2019-10-20 LAB — BASIC METABOLIC PANEL
Anion gap: 8 (ref 5–15)
BUN: 15 mg/dL (ref 8–23)
CO2: 26 mmol/L (ref 22–32)
Calcium: 9.1 mg/dL (ref 8.9–10.3)
Chloride: 104 mmol/L (ref 98–111)
Creatinine, Ser: 0.67 mg/dL (ref 0.44–1.00)
GFR calc Af Amer: >60 mL/min (ref >60)
GFR calc non Af Amer: >60 mL/min (ref >60)
Glucose, Bld: 95 mg/dL (ref 70–99)
Potassium: 4 mmol/L (ref 3.5–5.1)
Sodium: 138 mmol/L (ref 135–145)

## 2019-10-20 LAB — GLUCOSE, CAPILLARY
Glucose-Capillary: 87 mg/dL (ref 70–99)
Glucose-Capillary: 55 mg/dL — ABNORMAL LOW (ref 70–99)
Glucose-Capillary: 112 mg/dL — ABNORMAL HIGH (ref 70–99)
Glucose-Capillary: 133 mg/dL — ABNORMAL HIGH (ref 70–99)
Glucose-Capillary: 127 mg/dL — ABNORMAL HIGH (ref 70–99)

## 2019-10-20 NOTE — Progress Notes (Signed)
Occupational Therapy Session Note  Patient Details  Name: Melissa Holt MRN: [id number] Date of Birth: March 18, 1940  Today's Date: 10/20/2019 OT Individual Time: 9747-1855 OT Individual Time Calculation (min): 45 min    Short Term Goals: Week 2:  OT Short Term Goal 1 (Week 2): Continue working on established LTGs set at modified independent to supervision.  Skilled Therapeutic Interventions/Progress Updates:    OT session focused on functional transfers, standing balance, and L-NMR. Pt received in bed agreeable to therapy and requesting to toilet. Completed stand pivot transfer bed>w/c with CGA then completed toileting/toilet tx with SBA. Transitioned to therapy gym focusing on functional use of L hand. Pt placed 25 pegs into foam board with minimal dropping. Pt completed bead string x18 beads with mild-moderate difficulty depending on size of beads. At end of session, pt returned to room and left seated in w/c with safety belt and all needs in reach.   Therapy Documentation Precautions:  Precautions Precautions: Fall Restrictions Weight Bearing Restrictions: No General:   Vital Signs:  Pain:   ADL:   Vision   Perception    Praxis   Exercises:   Other Treatments:     Therapy/Group: Individual Therapy  Duayne Cal 10/20/2019, 12:15 PM

## 2019-10-20 NOTE — Progress Notes (Signed)
[place] PHYSICAL MEDICINE & REHABILITATION PROGRESS NOTE   Subjective/Complaints: Patient seen laying in bed this morning. She states she slept well overnight. She notes her right foot is sore.  ROS: + Right foot soreness. Denies CP, SOB, N/V/D  Objective:   No results found. No results for input(s): WBC, HGB, HCT, PLT in the last 72 hours. Recent Labs    10/20/19 0512  NA 138  K 4.0  CL 104  CO2 26  GLUCOSE 95  BUN 15  CREATININE 0.67  CALCIUM 9.1    Intake/Output Summary (Last 24 hours) at 10/20/2019 0806 Last data filed at 10/20/2019 0720 Gross per 24 hour  Intake 120 ml  Output --  Net 120 ml     Physical Exam: Vital Signs Blood pressure 122/62, pulse (!) 59, temperature 97.6 F (36.4 C), temperature source Oral, resp. rate 16, height 4\' 11"  (1.499 m), weight 71.2 kg, SpO2 98 %. Constitutional: No distress . Vital signs reviewed. HENT: Normocephalic.  Atraumatic. Eyes: EOMI. No discharge. Cardiovascular: No JVD.  RRR. Respiratory: Normal effort.  No stridor.  Bilateral clear to auscultation. GI: Non-distended.  BS +. Skin: Warm and dry.  Intact. Psych: Normal mood.  Normal behavior. Musc: No edema in extremities. Mild tenderness in the right foot Neuro: Alert Motor: RUE/RLE: 5/5 proximal distal LUE/LLE: 4+/5 proximal distal  Assessment/Plan: 1. Functional deficits secondary to acute/subacute CVA with chronic lacunar infarcts which require 3+ hours per day of interdisciplinary therapy in a comprehensive inpatient rehab setting.  Physiatrist is providing close team supervision and 24 hour management of active medical problems listed below.  Physiatrist and rehab team continue to assess barriers to discharge/monitor patient progress toward functional and medical goals  Care Tool:  Bathing    Body parts bathed by patient: Right arm, Left arm, Chest, Abdomen, Front perineal area, Buttocks, Right upper leg, Left upper leg, Right lower leg, Left lower leg,  Face   Body parts bathed by helper: Right lower leg, Left lower leg, Right arm, Buttocks     Bathing assist Assist Level: Minimal Assistance - Patient > 75%     Upper Body Dressing/Undressing Upper body dressing   What is the patient wearing?: Pull over shirt    Upper body assist Assist Level: Set up assist    Lower Body Dressing/Undressing Lower body dressing      What is the patient wearing?: Underwear/pull up, Pants     Lower body assist Assist for lower body dressing: Minimal Assistance - Patient > 75%     Toileting Toileting    Toileting assist Assist for toileting: Minimal Assistance - Patient > 75%     Transfers Chair/bed transfer  Transfers assist     Chair/bed transfer assist level: Contact Guard/Touching assist Chair/bed transfer assistive device: Programmer, multimedia   Ambulation assist      Assist level: Contact Guard/Touching assist Assistive device: Walker-rolling Max distance: 175   Walk 10 feet activity   Assist     Assist level: Contact Guard/Touching assist Assistive device: Walker-rolling   Walk 50 feet activity   Assist Walk 50 feet with 2 turns activity did not occur: Safety/medical concerns  Assist level: Contact Guard/Touching assist Assistive device: Walker-rolling    Walk 150 feet activity   Assist Walk 150 feet activity did not occur: Safety/medical concerns  Assist level: Contact Guard/Touching assist Assistive device: Walker-rolling    Walk 10 feet on uneven surface  activity   Assist Walk 10 feet on uneven surfaces  activity did not occur: Safety/medical concerns         Wheelchair     Assist Will patient use wheelchair at discharge?: No (No PT long term goals ) Type of Wheelchair: Manual    Wheelchair assist level: Supervision/Verbal cueing Max wheelchair distance: 30 ft    Wheelchair 50 feet with 2 turns activity    Assist    Wheelchair 50 feet with 2 turns activity did  not occur: Safety/medical concerns       Wheelchair 150 feet activity     Assist  Wheelchair 150 feet activity did not occur: Safety/medical concerns       Blood pressure 122/62, pulse (!) 59, temperature 97.6 F (36.4 C), temperature source Oral, resp. rate 16, height 4\' 11"  (1.499 m), weight 71.2 kg, SpO2 98 %.  Medical Problem List and Plan: 1.  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  Antithrombotics: -DVT/anticoagulation:  Pharmaceutical: Lovenox             -antiplatelet therapy: DAPT x3 weeks followed by Plavix alone 3. Pain Management: Tylenol prn. Kpad and sports cream for myofascial pain upper back, added for right foot as well 4. Mood: LCSW to follow for evaluation and support.              -antipsychotic agents: N/A 5. Neuropsych: This patient is capable of making decisions on her own behalf. 6. Skin/Wound Care: Routine pressure-relief measures 7. Fluids/Electrolytes/Nutrition: Monitor intake/output.  8. HTN: Monitor blood pressure 3 times daily. Continue amlodipine. Very well controlled Vitals:   10/19/19 1940 10/20/19 0349  BP: 140/67 122/62  Pulse: 68 (!) 59  Resp: 17 16  Temp: 97.8 F (36.6 C) 97.6 F (36.4 C)  SpO2: 98% 98%   Controlled on 9/11 9. DDD lumbar spine: Completed prednisone. 10. Obstipation:   Continue bowel meds  Improved 11. Prediabetes: Hgb A1c-5.8. Educate on appropriate diet  Labile on 9/11, monitor for trend 12. GERD/Reflux Sx's  Continue PPI, with improvement 14. Prerenal azotemia: Resolved  LOS: 8 days A FACE TO FACE EVALUATION WAS PERFORMED  Sherre Wooton Lorie Phenix 10/20/2019, 8:06 AM

## 2019-10-20 NOTE — Progress Notes (Signed)
Hypoglycemic Event  CBG: 55  Treatment: Lunch served  Symptoms: None  Follow-up CBG: Time:1314 CBG Result:112  Possible Reasons for Event: Unknown  Comments/MD notified:Treated per protocol. No insulin orders    Brigante, Warnell Bureau

## 2019-10-20 NOTE — Plan of Care (Signed)
  Problem: Consults Goal: RH GENERAL PATIENT EDUCATION Description: See Patient Education module for education specifics. Outcome: Progressing   Problem: RH BOWEL ELIMINATION Goal: RH STG MANAGE BOWEL WITH ASSISTANCE Description: STG Manage Bowel with Assistance. Patient will manage bowel with min assist. Outcome: Progressing   Problem: RH BLADDER ELIMINATION Goal: RH STG MANAGE BLADDER WITH ASSISTANCE Description: STG Manage Bladder With  mod  I Assistance  Outcome: Progressing   Problem: RH SKIN INTEGRITY Goal: RH STG SKIN FREE OF INFECTION/BREAKDOWN Description: Manage wit mod I Outcome: Progressing Goal: RH STG MAINTAIN SKIN INTEGRITY WITH ASSISTANCE Description: STG Maintain Skin Integrity With Assistance. Patient will main skin integrity with mod I assist. Outcome: Progressing   Problem: RH SAFETY Goal: RH STG ADHERE TO SAFETY PRECAUTIONS W/ASSISTANCE/DEVICE Description: STG Adhere to Safety Precautions With supervision Assistance/Device. Patient will utilize call bell for assistance to ambulate wit cues/reminders. Patient will not fall during this admission. Outcome: Progressing   Problem: RH PAIN MANAGEMENT Goal: RH STG PAIN MANAGED AT OR BELOW PT'S PAIN GOAL Description: Pain less than 3 on 0-10 pain scale. Outcome: Progressing   Problem: RH KNOWLEDGE DEFICIT GENERAL Goal: RH STG INCREASE KNOWLEDGE OF SELF CARE AFTER HOSPITALIZATION Description: Patient will be able to manage care at discharge wit mod I assist using handouts and c resources Outcome: Progressing   Problem: Consults Goal: RH STROKE PATIENT EDUCATION Description: See Patient Education module for education specifics  Outcome: Progressing   Problem: Consults Goal: RH STROKE PATIENT EDUCATION Description: See Patient Education module for education specifics  Outcome: Progressing Goal: Nutrition Consult-if indicated Description: Pt will have adequate oral intake will voice importance of maintaining  good nutritional intake with verbal cues Outcome: Progressing Goal: Diabetes Guidelines if Diabetic/Glucose > 140 Description: If diabetic or lab glucose is > 140 mg/dl - Initiate Diabetes/Hyperglycemia Guidelines & Document Interventions  Pt will demonstrate use of equipment with min cues, pt will have understanding of diet, medication, and need for f/u appointments with min cues Outcome: Progressing   Problem: RH KNOWLEDGE DEFICIT Goal: RH STG INCREASE KNOWLEDGE OF DIABETES Description: Patient will be able to manage DM with diet, medications and exercise using handouts and educational materials independently Outcome: Progressing Goal: RH STG INCREASE KNOWLEDGE OF HYPERTENSION Description: Pt will voice understanding of risks associated with hypertension and stoke. Pt will voice understanding of medications to control hypertension and dietary restrictions with cues Outcome: Progressing Goal: RH STG INCREASE KNOWLEGDE OF HYPERLIPIDEMIA Description: Patient will voice understanding of effects of hyperlipidemia on health and medications used to control hyperlipidemia with cues Outcome: Progressing Goal: RH STG INCREASE KNOWLEDGE OF STROKE PROPHYLAXIS Description: Patient will voice understanding of stroke prevention and warning signs of stroke. Patient will voice understanding of medications and diet that are prescribed and rationale with cues Outcome: Progressing

## 2019-10-21 ENCOUNTER — Inpatient Hospital Stay (HOSPITAL_COMMUNITY): Payer: Medicare Other

## 2019-10-21 ENCOUNTER — Encounter (HOSPITAL_COMMUNITY): Payer: Medicare Other | Admitting: Occupational Therapy

## 2019-10-21 ENCOUNTER — Inpatient Hospital Stay (HOSPITAL_COMMUNITY): Payer: Medicare Other | Admitting: Speech Pathology

## 2019-10-21 DIAGNOSIS — M79671 Pain in right foot: Secondary | ICD-10-CM

## 2019-10-21 DIAGNOSIS — M79672 Pain in left foot: Secondary | ICD-10-CM

## 2019-10-21 LAB — GLUCOSE, CAPILLARY
Glucose-Capillary: 84 mg/dL (ref 70–99)
Glucose-Capillary: 120 mg/dL — ABNORMAL HIGH (ref 70–99)
Glucose-Capillary: 100 mg/dL — ABNORMAL HIGH (ref 70–99)
Glucose-Capillary: 144 mg/dL — ABNORMAL HIGH (ref 70–99)

## 2019-10-21 MED ORDER — GABAPENTIN 100 MG PO CAPS
100.0000 mg | ORAL_CAPSULE | Freq: Two times a day (BID) | ORAL | Status: DC
  Administered 2019-10-21 – 2019-10-24 (×7): 100 mg via ORAL
  Filled 2019-10-21 (×7): qty 1

## 2019-10-21 NOTE — Progress Notes (Signed)
Physical Therapy Session Note  Patient Details  Name: Melissa Holt MRN: [id number] Date of Birth: 09-22-1940  Today's Date: 10/21/2019 PT Individual Time: 0800-0825 PT Individual Time Calculation (min): 25 min   Short Term Goals: Week 1:  PT Short Term Goal 1 (Week 1): Pt will negotiate 4 steps with B rails & CGA. PT Short Term Goal 1 - Progress (Week 1): Progressing toward goal PT Short Term Goal 2 (Week 1): Pt will ambulate 75 ft with LRAD & min assist. PT Short Term Goal 2 - Progress (Week 1): Met PT Short Term Goal 3 (Week 1): Pt will complete bed mobility with min assist. PT Short Term Goal 3 - Progress (Week 1): Met PT Short Term Goal 4 (Week 1): Pt will complete bed<>w/c with LRAD & CGA. PT Short Term Goal 4 - Progress (Week 1): Met Week 2:  PT Short Term Goal 1 (Week 2): STG= LTG due to ELOS  Skilled Therapeutic Interventions/Progress Updates:   Received pt supine in bed, pt agreeable to therapy, and reported bilateral heel pain but did not state pain number. Pt reported she received topical cream this morning and declined pain medication stating "it doesn't help." Session with emphasis on functional mobility/transfers, toileting, dressing, generalized strengthening, dynamic standing balance/coordination, ambulation, and improved activity tolerance. Pt transferred supine<>sitting EOB with HOB elevated and use of bedrails with supervision. Pt transferred sit<>stand with RW CGA/min A and ambulated 45ft x 2 trials to/from bathroom with RW and CGA with 2 LOB to R requiring min A to correct. Pt able to void and perform hygeine management with supervision while sitting on toilet. Doffed dirty shirt and donned clean pull over shirt with min A. Donned pants sitting on toilet with mod A and pt transferred sit<>stand with CGA and use of grab bar x 2 trials and required max A to adjust brief and pull pants over hips. Doffed non-skid socks and donned socks and shoes with total A for time management  purposes. Concluded session with pt sitting in WC, needs within reach, and seatbelt alarm on.   Therapy Documentation Precautions:  Precautions Precautions: Fall Restrictions Weight Bearing Restrictions: No  Therapy/Group: Individual Therapy Alfonse Alpers PT, DPT   10/21/2019, 7:15 AM

## 2019-10-21 NOTE — Progress Notes (Signed)
Slept good. Continent B & B, LBM-09/11. C/O pain to right foot and "all of my toes on my right foot." Patient reports feeling like she has a "plank on the bottom of my foot." Denied tylenol, "it doesn't work." Agreed to PRN sports cream. Cornell Barman

## 2019-10-21 NOTE — Progress Notes (Signed)
Occupational Therapy Session Note  Patient Details  Name: Melissa Holt MRN: [id number] Date of Birth: [birth year]  Today's Date: 10/21/2019 OT Group Time: 1100-1200 OT Group Time Calculation (min): 60 min  Skilled Therapeutic Interventions/Progress Updates:    Pt engaged in therapeutic w/c level dance group focusing on patient choice, UE/LE strengthening, salience, activity tolerance, and social participation. Pt was guided through various dance-based exercises involving UEs/LEs and trunk. All music was selected by group members. Emphasis placed on Lt NMR and standing balance. Pt did very well in group, engaged socially with others and was mindful of working on her Lt side. She stood with OT and Min balance assistance during 2 songs, either without UE support on RW or bilateral support on RW while stepping with her feet. At end of session pt was returned to room by PT.    Therapy Documentation Precautions:  Precautions Precautions: Fall Restrictions Weight Bearing Restrictions: No Pain: no s/s pain during tx Pain Assessment Pain Scale: 0-10 Pain Score: 0-No pain ADL:        Therapy/Group: Group Therapy  [name] 10/21/2019, 12:50 PM

## 2019-10-21 NOTE — Progress Notes (Signed)
[facility] PHYSICAL MEDICINE & REHABILITATION PROGRESS NOTE   Subjective/Complaints: Patient seen laying in bed this morning.  She states she slept well overnight.  She notes improvement in foot pain, but now states that her heels are sore.  ROS: Sore to bilateral heels.  Denies CP, SOB, N/V/D  Objective:   No results found. No results for input(s): WBC, HGB, HCT, PLT in the last 72 hours. Recent Labs    10/20/19 0512  NA 138  K 4.0  CL 104  CO2 26  GLUCOSE 95  BUN 15  CREATININE 0.67  CALCIUM 9.1    Intake/Output Summary (Last 24 hours) at 10/21/2019 0903 Last data filed at 10/20/2019 2300 Gross per 24 hour  Intake 480 ml  Output --  Net 480 ml     Physical Exam: Vital Signs Blood pressure 132/68, pulse 65, temperature 98.5 F (36.9 C), resp. rate 18, height 4\' 11"  (1.499 m), weight 71.2 kg, SpO2 98 %. Constitutional: No distress . Vital signs reviewed. HENT: Normocephalic.  Atraumatic. Eyes: EOMI. No discharge. Cardiovascular: No JVD.  RRR. Respiratory: Normal effort.  No stridor.  Bilateral clear to auscultation. GI: Non-distended.  BS +. Skin: Warm and dry.  Intact. Psych: Normal mood.  Normal behavior. Musc: No edema in extremities.  No tenderness in extremities, including heels. Neuro: Alert Motor: RUE/RLE: 5/5 proximal distal LUE/LLE: 4+/5 proximal distal, unchanged  Assessment/Plan: 1. Functional deficits secondary to acute/subacute CVA with chronic lacunar infarcts which require 3+ hours per day of interdisciplinary therapy in a comprehensive inpatient rehab setting.  Physiatrist is providing close team supervision and 24 hour management of active medical problems listed below.  Physiatrist and rehab team continue to assess barriers to discharge/monitor patient progress toward functional and medical goals  Care Tool:  Bathing    Body parts bathed by patient: Right arm, Left arm, Chest, Abdomen, Front perineal area, Buttocks, Right upper leg, Left  upper leg, Right lower leg, Left lower leg, Face   Body parts bathed by helper: Right lower leg, Left lower leg, Right arm, Buttocks     Bathing assist Assist Level: Minimal Assistance - Patient > 75%     Upper Body Dressing/Undressing Upper body dressing   What is the patient wearing?: Pull over shirt    Upper body assist Assist Level: Set up assist    Lower Body Dressing/Undressing Lower body dressing      What is the patient wearing?: Underwear/pull up, Pants     Lower body assist Assist for lower body dressing: Minimal Assistance - Patient > 75%     Toileting Toileting    Toileting assist Assist for toileting: Supervision/Verbal cueing     Transfers Chair/bed transfer  Transfers assist     Chair/bed transfer assist level: Contact Guard/Touching assist Chair/bed transfer assistive device: Programmer, multimedia   Ambulation assist      Assist level: Contact Guard/Touching assist Assistive device: Walker-rolling Max distance: 175   Walk 10 feet activity   Assist     Assist level: Contact Guard/Touching assist Assistive device: Walker-rolling   Walk 50 feet activity   Assist Walk 50 feet with 2 turns activity did not occur: Safety/medical concerns  Assist level: Contact Guard/Touching assist Assistive device: Walker-rolling    Walk 150 feet activity   Assist Walk 150 feet activity did not occur: Safety/medical concerns  Assist level: Contact Guard/Touching assist Assistive device: Walker-rolling    Walk 10 feet on uneven surface  activity   Assist Walk 10  feet on uneven surfaces activity did not occur: Safety/medical concerns         Wheelchair     Assist Will patient use wheelchair at discharge?: No (No PT long term goals ) Type of Wheelchair: Manual    Wheelchair assist level: Supervision/Verbal cueing Max wheelchair distance: 30 ft    Wheelchair 50 feet with 2 turns activity    Assist    Wheelchair  50 feet with 2 turns activity did not occur: Safety/medical concerns       Wheelchair 150 feet activity     Assist  Wheelchair 150 feet activity did not occur: Safety/medical concerns       Blood pressure 132/68, pulse 65, temperature 98.5 F (36.9 C), resp. rate 18, height 4\' 11"  (1.499 m), weight 71.2 kg, SpO2 98 %.  Medical Problem List and Plan: 1.  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  Antithrombotics: -DVT/anticoagulation:  Pharmaceutical: Lovenox             -antiplatelet therapy: DAPT x3 weeks followed by Plavix alone 3. Pain Management: Tylenol prn. Kpad and sports cream for myofascial pain upper back, added for right foot as well  Low-dose gabapentin added for foot pain on 9/12 4. Mood: LCSW to follow for evaluation and support.              -antipsychotic agents: N/A 5. Neuropsych: This patient is capable of making decisions on her own behalf. 6. Skin/Wound Care: Routine pressure-relief measures 7. Fluids/Electrolytes/Nutrition: Monitor intake/output.   BMP within normal limits on 9/6 8. HTN: Monitor blood pressure 3 times daily. Continue amlodipine. Very well controlled Vitals:   10/20/19 1947 10/21/19 0412  BP: 123/65 132/68  Pulse: 76 65  Resp: 18 18  Temp: 98.4 F (36.9 C) 98.5 F (36.9 C)  SpO2: 100% 98%   Controlled on 9/12 9. DDD lumbar spine: Completed prednisone. 10. Obstipation:   Continue bowel meds  Improved 11. Prediabetes: Hgb A1c-5.8. Educate on appropriate diet  Labile on 9/12, monitor for trend 12. GERD/Reflux Sx's  Continue PPI, with improvement 14. Prerenal azotemia: Resolved  LOS: 9 days A FACE TO FACE EVALUATION WAS PERFORMED  Melissa Holt Lorie Phenix 10/21/2019, 9:03 AM

## 2019-10-21 NOTE — Progress Notes (Signed)
Speech Language Pathology Daily Session Note  Patient Details  Name: Melissa Holt MRN: [id number] Date of Birth: 23-Mar-1940  Today's Date: 10/21/2019 SLP Individual Time: 6962-9528 SLP Individual Time Calculation (min): 41 min  Short Term Goals: Week 2: SLP Short Term Goal 1 (Week 2): STG=LTG due to short ELOS  Skilled Therapeutic Interventions: Pt was seen for skilled ST targeting swallow and cognitive goals. SLP facilitated session with a regular texture snack and thin liquid to assess tolerance. Pt demonstrated efficient mastication and oral clearance of regular textures and timely transit of all POs without overt s/sx aspiration. She used swallow precautions Mod I. Pt used external aid to recall current medications with Min A verbal and visual cues. She used daily schedule to anticipate appointments for the day Mod I. Min A question cues required for pt to anticipate potential equipment needs, and to recall 1 out of 2 safety precautions with use of rolling walker (stay inside walker when walking with it). Pt left sitting in chair with alarm set and needs within reach. Continue per current plan of care.          Pain Pain Assessment Pain Scale: 0-10 Pain Score: 0-No pain  Therapy/Group: Individual Therapy  Melissa Holt 10/21/2019, 12:07 PM

## 2019-10-22 ENCOUNTER — Inpatient Hospital Stay (HOSPITAL_COMMUNITY): Payer: Medicare Other | Admitting: Occupational Therapy

## 2019-10-22 ENCOUNTER — Inpatient Hospital Stay (HOSPITAL_COMMUNITY): Payer: Medicare Other | Admitting: Speech Pathology

## 2019-10-22 ENCOUNTER — Inpatient Hospital Stay (HOSPITAL_COMMUNITY): Payer: Medicare Other

## 2019-10-22 DIAGNOSIS — I6389 Other cerebral infarction: Secondary | ICD-10-CM

## 2019-10-22 LAB — CBC
HCT: 35.9 % — ABNORMAL LOW (ref 36.0–46.0)
Hemoglobin: 11.2 g/dL — ABNORMAL LOW (ref 12.0–15.0)
MCH: 30 pg (ref 26.0–34.0)
MCHC: 31.2 g/dL (ref 30.0–36.0)
MCV: 96.2 fL (ref 80.0–100.0)
Platelets: 186 10*3/uL (ref 150–400)
RBC: 3.73 MIL/uL — ABNORMAL LOW (ref 3.87–5.11)
RDW: 12.7 % (ref 11.5–15.5)
WBC: 8.9 10*3/uL (ref 4.0–10.5)
nRBC: 0 % (ref 0.0–0.2)

## 2019-10-22 LAB — GLUCOSE, CAPILLARY
Glucose-Capillary: 96 mg/dL (ref 70–99)
Glucose-Capillary: 79 mg/dL (ref 70–99)
Glucose-Capillary: 144 mg/dL — ABNORMAL HIGH (ref 70–99)
Glucose-Capillary: 118 mg/dL — ABNORMAL HIGH (ref 70–99)

## 2019-10-22 NOTE — Progress Notes (Signed)
Occupational Therapy Session Note  Patient Details  Name: Perla Echavarria MRN: [id number] Date of Birth: August 15, 1940  Today's Date: 10/22/2019 OT Individual Time: 1002-1100 OT Individual Time Calculation (min): 58 min    Short Term Goals: Week 2:  OT Short Term Goal 1 (Week 2): Continue working on established LTGs set at modified independent to supervision.  Skilled Therapeutic Interventions/Progress Updates:    Pt worked on bathing and dressing sit to stand at the sink during session.  She was able to completed all UB bathing in sitting with setup assistance.  LB bathing was completed with min guard assist sit to stand.  LB dressing was min assist overall with assist needed for donning her pants over the LLE as well as donning her left shoe and tying both of them.  She was able to complete oral hygiene with setup from the wheelchair to complete session.  Call button and phone in reach with safety belt in place.    Therapy Documentation Precautions:  Precautions Precautions: Fall Restrictions Weight Bearing Restrictions: No   Pain: Pain Assessment Pain Scale: Faces Pain Score: 0-No pain ADL: See Care Tool Section for some details of mobility and selfcare  Therapy/Group: Individual Therapy  Reene Harlacher OTR/L 10/22/2019, 11:01 AM

## 2019-10-22 NOTE — Progress Notes (Signed)
Physical Therapy Session Note  Patient Details  Name: Melissa Holt MRN: [id number] Date of Birth: 1940-04-11  Today's Date: 10/22/2019 PT Individual Time: 1411-1535 PT Individual Time Calculation (min): 84 min   Short Term Goals: Week 2:  PT Short Term Goal 1 (Week 2): STG= LTG due to ELOS  Skilled Therapeutic Interventions/Progress Updates:    Patient received sitting up in wc agreeable to PT. She denies pain at this time. Patient able to propel wc ~82ft using B UE before requesting assist. Patient initially requiring CGA-MinA to ascend 6" steps x4 using B HR. She did have difficulty clearing L foot onto step, but was able to compensate with R lateral lean and hip hike. Upon descending stairs, patient requiring up to [place] d/t difficulty advancing L LE down stairs. Patient found to have R knee in excessive flexion making it more difficult for L foot to clear step. Patient agreeable to trialing stairs again with L AFO to assist with toe clearance after a rest break. Patient initially requiring MinA with excellent foot clearance when ascending stairs. Upon descending, patient maintained crouched posture requiring up to MaxA to prevent B knees from buckling and advance L LE down stairs. Patient stating that while she will not have someone home with her 24/7, she will have assist when she is negotiating her stairs to get into/out of home. Patient requesting to return to room to use restroom. MinA transfer to toilet, MinA transfer back to wc. Patient completing NMR in // bars: standing L LE D1 PNF diagonal onto 1.75" step 4x8. As patient would fatigue, greater difficulty clearing L foot. Patient ambulating from wc to therapy mat x79ft with FWW + CGA. She required SPV + verbal cues for sit > supine to assist with L LE management. Supine heelslides with 2.5# AW 3x4, hooklying marches with 2.5# AW 3x4, hooklying bridges 3x4. Patient requiring up to MinA for supine > sit. PT discussed possibility of patient  adding bed rails to her bed at home to assist with independence with bed mobility. Patient returning to room in wc, seatbelt alarm in place, call light within reach.    Therapy Documentation Precautions:  Precautions Precautions: Fall Restrictions Weight Bearing Restrictions: No   Therapy/Group: Individual Therapy  Karoline Caldwell, PT, DPT, CBIS 10/22/2019, 7:51 AM

## 2019-10-22 NOTE — Progress Notes (Signed)
[place] PHYSICAL MEDICINE & REHABILITATION PROGRESS NOTE   Subjective/Complaints: No initial complaints. When asked, said slept so-so. Does not want melatonin. Bowels regular. Has some pain in bilateral ankles- not new- does not want ice.   ROS: Denies CP, SOB, N/V/D  Objective:   No results found. Recent Labs    10/22/19 0550  WBC 8.9  HGB 11.2*  HCT 35.9*  PLT 186   Recent Labs    10/20/19 0512  NA 138  K 4.0  CL 104  CO2 26  GLUCOSE 95  BUN 15  CREATININE 0.67  CALCIUM 9.1    Intake/Output Summary (Last 24 hours) at 10/22/2019 0903 Last data filed at 10/22/2019 0750 Gross per 24 hour  Intake 600 ml  Output --  Net 600 ml     Physical Exam: Vital Signs Blood pressure 130/63, pulse 66, temperature 98.2 F (36.8 C), resp. rate 18, height 4\' 11"  (1.499 m), weight 71.2 kg, SpO2 100 %. General: Alert, No apparent distress HEENT: Head is normocephalic, atraumatic, PERRLA, EOMI, sclera anicteric, oral mucosa pink and moist, dentition intact, ext ear canals clear,  Neck: Supple without JVD or lymphadenopathy Heart: Reg rate and rhythm. No murmurs rubs or gallops Chest: CTA bilaterally without wheezes, rales, or rhonchi; no distress Abdomen: Soft, non-tender, non-distended, bowel sounds positive. Extremities: No clubbing, cyanosis, or edema. Pulses are 2+ Skin: Clean and intact without signs of breakdown Neuro: Alert Motor: RUE/RLE: 5/5 proximal distal LUE/LLE: 4+/5 proximal distal, unchanged Reports pain with palpation of foot diffusely, mild   Assessment/Plan: 1. Functional deficits secondary to acute/subacute CVA with chronic lacunar infarcts which require 3+ hours per day of interdisciplinary therapy in a comprehensive inpatient rehab setting.  Physiatrist is providing close team supervision and 24 hour management of active medical problems listed below.  Physiatrist and rehab team continue to assess barriers to discharge/monitor patient progress  toward functional and medical goals  Care Tool:  Bathing    Body parts bathed by patient: Right arm, Left arm, Chest, Abdomen, Front perineal area, Buttocks, Right upper leg, Left upper leg, Right lower leg, Left lower leg, Face   Body parts bathed by helper: Right lower leg, Left lower leg, Right arm, Buttocks     Bathing assist Assist Level: Minimal Assistance - Patient > 75%     Upper Body Dressing/Undressing Upper body dressing   What is the patient wearing?: Pull over shirt    Upper body assist Assist Level: Set up assist    Lower Body Dressing/Undressing Lower body dressing      What is the patient wearing?: Underwear/pull up, Pants     Lower body assist Assist for lower body dressing: Minimal Assistance - Patient > 75%     Toileting Toileting    Toileting assist Assist for toileting: Supervision/Verbal cueing     Transfers Chair/bed transfer  Transfers assist     Chair/bed transfer assist level: Contact Guard/Touching assist Chair/bed transfer assistive device: Programmer, multimedia   Ambulation assist      Assist level: Contact Guard/Touching assist Assistive device: Walker-rolling Max distance: 175   Walk 10 feet activity   Assist     Assist level: Contact Guard/Touching assist Assistive device: Walker-rolling   Walk 50 feet activity   Assist Walk 50 feet with 2 turns activity did not occur: Safety/medical concerns  Assist level: Contact Guard/Touching assist Assistive device: Walker-rolling    Walk 150 feet activity   Assist Walk 150 feet activity did not occur: Safety/medical  concerns  Assist level: Contact Guard/Touching assist Assistive device: Walker-rolling    Walk 10 feet on uneven surface  activity   Assist Walk 10 feet on uneven surfaces activity did not occur: Safety/medical concerns         Wheelchair     Assist Will patient use wheelchair at discharge?: No (No PT long term goals ) Type of  Wheelchair: Manual    Wheelchair assist level: Supervision/Verbal cueing Max wheelchair distance: 30 ft    Wheelchair 50 feet with 2 turns activity    Assist    Wheelchair 50 feet with 2 turns activity did not occur: Safety/medical concerns       Wheelchair 150 feet activity     Assist  Wheelchair 150 feet activity did not occur: Safety/medical concerns       Blood pressure 130/63, pulse 66, temperature 98.2 F (36.8 C), resp. rate 18, height 4\' 11"  (1.499 m), weight 71.2 kg, SpO2 100 %.  Medical Problem List and Plan: 1.  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  Antithrombotics: -DVT/anticoagulation:  Pharmaceutical: Lovenox             -antiplatelet therapy: DAPT x3 weeks followed by Plavix alone 3. Pain Management: Tylenol prn. Kpad and sports cream for myofascial pain upper back, added for right foot as well  Low-dose gabapentin added for foot pain on 9/12  9/13: she feels this helped a little bit.  4. Mood: LCSW to follow for evaluation and support.              -antipsychotic agents: N/A 5. Neuropsych: This patient is capable of making decisions on her own behalf. 6. Skin/Wound Care: Routine pressure-relief measures 7. Fluids/Electrolytes/Nutrition: Monitor intake/output.   BMP within normal limits 9/13 8. HTN: Monitor blood pressure 3 times daily. Continue amlodipine. Very well controlled Vitals:   10/21/19 1953 10/22/19 0502  BP: 134/64 130/63  Pulse: 76 66  Resp: 20 18  Temp: 98 F (36.7 C) 98.2 F (36.8 C)  SpO2: 98% 100%   Controlled on 9/12 9. DDD lumbar spine: Completed prednisone. 10. Obstipation:   Continue bowel meds  Improved 11. Prediabetes: Hgb A1c-5.8. Educate on appropriate diet  Labile on 9/12, monitor for trend 12. GERD/Reflux Sx's  Continue PPI, with improvement 14. Prerenal azotemia: Resolved 15. Acute blood loss anemia: Hgb down  to 11.2. Denies weakness.   LOS: 10 days A FACE TO FACE EVALUATION WAS PERFORMED  Melissa Holt 10/22/2019, 9:03 AM

## 2019-10-22 NOTE — Progress Notes (Signed)
Speech Language Pathology Discharge Summary  Patient Details  Name: Melissa Holt MRN: [id number] Date of Birth: 11/30/1940  Today's Date: 10/22/2019 SLP Individual Time: 0730-0827 SLP Individual Time Calculation (min): 57 min   Skilled Therapeutic Interventions:  Pt was seen for skilled ST targeting cognitive goals. SLP facilitated session with a semi-complex monthly calendar/scheduling task, during which pt required Supervision A for use of compensatory memory strategies. During a novel semi-complex card task, pt demonstrated ability to use external aid as memory strategy with Supervision A verbal cues, and was Mod I for problem solving abilities, but increased Supervision A level verbal cues required for planning and error awareness. Pt also demonstrated ability to divide her attention between card task and phone call conversation near end of session without need for cues. Given that pt has met all ST treatment goals and is believed to be at baseline level of cognitive functioning, pt with discharge from [facility] today - no further services are indicated while inpatient. Pt left sitting in chair with alarm set and needs within reach. Continue per current plan of care.    Patient has met 3 of 3 long term goals.  Patient to discharge at overall Modified Independent;Supervision level.  Reasons goals not met: n/a   Clinical Impression/Discharge Summary:   Pt made excellent functional gains and met 3 out of 3 long term goals this admission. Pt currently requires Supervision assist for use of strategies to facilitate recall of novel information, however is Mod I for complex functional problem solving and use of safe swallowing strategies. She would benefit from 24/7 supervision at home due to memory deficits, however her safety awareness and judgement in regards to safe decision making has been excellent while inpatient, therefore pt could be left alone for short intervals of time throughout the day if needed  (intermittent supervision). Pt is consuming a regular texture diet with thin liquids Mod I. Given that pt is believed to be at baseline level of cognitive functioning and Mod I-Supervision for complex tasks, no follow up ST is indicated. Pt education is also complete at this time.    Care Partner:  Caregiver Able to Provide Assistance: Yes  Type of Caregiver Assistance: Cognitive  Recommendation:  24 hour supervision/assistance  Rationale for SLP Follow Up:  (n/a)   Equipment: none   Reasons for discharge: Treatment goals met   Patient/Family Agrees with Progress Made and Goals Achieved: Yes    Arbutus Leas 10/22/2019, 10:23 AM

## 2019-10-22 NOTE — Plan of Care (Signed)
  Problem: RH Ambulation Goal: LTG Patient will ambulate in controlled environment (PT) Description: LTG: Patient will ambulate in a controlled environment, # of feet with assistance (PT). Flowsheets (Taken 10/22/2019 1607) LTG: Pt will ambulate in controlled environ  assist needed:: Contact Guard/Touching assist Goal: LTG Patient will ambulate in home environment (PT) Description: LTG: Patient will ambulate in home environment, # of feet with assistance (PT). Flowsheets (Taken 10/22/2019 1607) LTG: Pt will ambulate in home environ  assist needed:: Contact Guard/Touching assist   Problem: RH Stairs Goal: LTG Patient will ambulate up and down stairs w/assist (PT) Description: LTG: Patient will ambulate up and down # of stairs with assistance (PT) Flowsheets (Taken 10/22/2019 1607) LTG: Pt will ambulate up/down stairs assist needed:: Minimal Assistance - Patient > 75%

## 2019-10-22 NOTE — Plan of Care (Signed)
  Problem: RH Bathing Goal: LTG Patient will bathe all body parts with assist levels (OT) Description: LTG: Patient will bathe all body parts with assist levels (OT) Flowsheets (Taken 10/22/2019 1209) LTG: Pt will perform bathing with assistance level/cueing: (Goals downgraded based on progress.) Supervision/Verbal cueing LTG: Position pt will perform bathing: Sit to Stand Note: Goals downgraded based on progress.   Problem: RH Dressing Goal: LTG Patient will perform lower body dressing w/assist (OT) Description: LTG: Patient will perform lower body dressing with assist, with/without cues in positioning using equipment (OT) Flowsheets (Taken 10/22/2019 1209) LTG: Pt will perform lower body dressing with assistance level of: (Goals downgraded based on progress.) Minimal Assistance - Patient > 75% Note: Goals downgraded based on progress.   Problem: RH Toileting Goal: LTG Patient will perform toileting task (3/3 steps) with assistance level (OT) Description: LTG: Patient will perform toileting task (3/3 steps) with assistance level (OT)  Flowsheets (Taken 10/22/2019 1209) LTG: Pt will perform toileting task (3/3 steps) with assistance level: (Goals downgraded based on progress.) Supervision/Verbal cueing Note: Goals downgraded based on progress.   Problem: RH Simple Meal Prep Goal: LTG Patient will perform simple meal prep w/assist (OT) Description: LTG: Patient will perform simple meal prep with assistance, with/without cues (OT). Flowsheets (Taken 10/22/2019 1209) LTG: Pt will perform simple meal prep with assistance level of: (Goals downgraded based on progress.) Supervision/Verbal cueing Note: Goals downgraded based on progress.   Problem: RH Toilet Transfers Goal: LTG Patient will perform toilet transfers w/assist (OT) Description: LTG: Patient will perform toilet transfers with assist, with/without cues using equipment (OT) Flowsheets (Taken 10/22/2019 1209) LTG: Pt will perform  toilet transfers with assistance level of: (Goals downgraded based on progress.) Supervision/Verbal cueing Note: Goals downgraded based on progress.

## 2019-10-23 ENCOUNTER — Inpatient Hospital Stay (HOSPITAL_COMMUNITY): Payer: Medicare Other | Admitting: Occupational Therapy

## 2019-10-23 ENCOUNTER — Inpatient Hospital Stay (HOSPITAL_COMMUNITY): Payer: Medicare Other

## 2019-10-23 DIAGNOSIS — I6612 Occlusion and stenosis of left anterior cerebral artery: Secondary | ICD-10-CM

## 2019-10-23 LAB — GLUCOSE, CAPILLARY
Glucose-Capillary: 92 mg/dL (ref 70–99)
Glucose-Capillary: 93 mg/dL (ref 70–99)
Glucose-Capillary: 116 mg/dL — ABNORMAL HIGH (ref 70–99)
Glucose-Capillary: 145 mg/dL — ABNORMAL HIGH (ref 70–99)

## 2019-10-23 NOTE — Progress Notes (Signed)
[facility] PHYSICAL MEDICINE & REHABILITATION PROGRESS NOTE   Subjective/Complaints: Patient had a birthday yesterday. She has no issues today. Went to physical therapy. Discussed her discharge date, she has 3-4 steps to get into her home, has been practicing steps with therapy  ROS: Denies CP, SOB, N/V/D  Objective:   No results found. Recent Labs    10/22/19 0550  WBC 8.9  HGB 11.2*  HCT 35.9*  PLT 186   No results for input(s): NA, K, CL, CO2, GLUCOSE, BUN, CREATININE, CALCIUM in the last 72 hours.  Intake/Output Summary (Last 24 hours) at 10/23/2019 0827 Last data filed at 10/23/2019 0748 Gross per 24 hour  Intake 538 ml  Output --  Net 538 ml     Physical Exam: Vital Signs Blood pressure 135/70, pulse 76, temperature 98.2 F (36.8 C), resp. rate 18, height 4\' 11"  (1.499 m), weight 71.2 kg, SpO2 98 %.  General: No acute distress Mood and affect are appropriate Heart: Regular rate and rhythm no rubs murmurs or extra sounds Lungs: Clear to auscultation, breathing unlabored, no rales or wheezes Abdomen: Positive bowel sounds, soft nontender to palpation, nondistended Extremities: No clubbing, cyanosis, or edema Skin: No evidence of breakdown, no evidence of rash   Neuro: Alert Motor: RUE/RLE: 5/5 proximal distal LUE/LLE: 4+/5 proximal distal, unchanged Reports pain with palpation of foot diffusely, mild   Assessment/Plan: 1. Functional deficits secondary to acute/subacute CVA with chronic lacunar infarcts which require 3+ hours per day of interdisciplinary therapy in a comprehensive inpatient rehab setting.  Physiatrist is providing close team supervision and 24 hour management of active medical problems listed below.  Physiatrist and rehab team continue to assess barriers to discharge/monitor patient progress toward functional and medical goals  Care Tool:  Bathing    Body parts bathed by patient: Right arm, Left arm, Chest, Abdomen, Front perineal area,  Buttocks, Right upper leg, Left upper leg, Right lower leg, Left lower leg, Face   Body parts bathed by helper: Right lower leg, Left lower leg, Right arm, Buttocks     Bathing assist Assist Level: Contact Guard/Touching assist     Upper Body Dressing/Undressing Upper body dressing   What is the patient wearing?: Pull over shirt    Upper body assist Assist Level: Set up assist    Lower Body Dressing/Undressing Lower body dressing      What is the patient wearing?: Underwear/pull up, Pants     Lower body assist Assist for lower body dressing: Minimal Assistance - Patient > 75%     Toileting Toileting    Toileting assist Assist for toileting: Supervision/Verbal cueing     Transfers Chair/bed transfer  Transfers assist     Chair/bed transfer assist level: Contact Guard/Touching assist Chair/bed transfer assistive device: Programmer, multimedia   Ambulation assist      Assist level: Contact Guard/Touching assist Assistive device: Walker-rolling Max distance: 15   Walk 10 feet activity   Assist     Assist level: Contact Guard/Touching assist Assistive device: Walker-rolling   Walk 50 feet activity   Assist Walk 50 feet with 2 turns activity did not occur: Safety/medical concerns  Assist level: Contact Guard/Touching assist Assistive device: Walker-rolling    Walk 150 feet activity   Assist Walk 150 feet activity did not occur: Safety/medical concerns  Assist level: Contact Guard/Touching assist Assistive device: Walker-rolling    Walk 10 feet on uneven surface  activity   Assist Walk 10 feet on uneven surfaces activity did  not occur: Safety/medical concerns         Wheelchair     Assist Will patient use wheelchair at discharge?: No (No PT long term goals ) Type of Wheelchair: Manual    Wheelchair assist level: Supervision/Verbal cueing Max wheelchair distance: 30 ft    Wheelchair 50 feet with 2 turns  activity    Assist    Wheelchair 50 feet with 2 turns activity did not occur: Safety/medical concerns       Wheelchair 150 feet activity     Assist  Wheelchair 150 feet activity did not occur: Safety/medical concerns       Blood pressure 135/70, pulse 76, temperature 98.2 F (36.8 C), resp. rate 18, height 4\' 11"  (1.499 m), weight 71.2 kg, SpO2 98 %.  Medical Problem List and Plan: 1.  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 2.  Antithrombotics: -DVT/anticoagulation:  Pharmaceutical: Lovenox             -antiplatelet therapy: DAPT x3 weeks followed by Plavix alone 3. Pain Management: Tylenol prn. Kpad and sports cream for myofascial pain upper back, added for right foot as well  Low-dose gabapentin added for foot pain on 9/12  9/13: she feels this helped a little bit.  4. Mood: LCSW to follow for evaluation and support.              -antipsychotic agents: N/A 5. Neuropsych: This patient is capable of making decisions on her own behalf. 6. Skin/Wound Care: Routine pressure-relief measures 7. Fluids/Electrolytes/Nutrition: Monitor intake/output.   BMP within normal limits 9/13 8. HTN: Monitor blood pressure 3 times daily. Continue amlodipine. Very well controlled Vitals:   10/22/19 2006 10/23/19 0517  BP: (!) 134/57 135/70  Pulse:  76  Resp: 16 18  Temp: 98.5 F (36.9 C) 98.2 F (36.8 C)  SpO2: 97% 98%   Controlled on 9/14 9. DDD lumbar spine: Completed prednisone. 10. Obstipation:   Continue bowel meds  Improved 11. Prediabetes: Hgb A1c-5.8. Educate on appropriate diet  Labile on 9/12, monitor for trend 12. GERD/Reflux Sx's  Continue PPI, with improvement 14. Prerenal azotemia: Resolved 15. Acute blood loss anemia: Hgb down to 11.2. Denies weakness.   LOS: 11 days A FACE TO FACE EVALUATION  WAS PERFORMED  Charlett Blake 10/23/2019, 8:27 AM

## 2019-10-23 NOTE — Progress Notes (Signed)
Physical Therapy Session Note  Patient Details  Name: Melissa Holt MRN: [id number] Date of Birth: [birth year]  Today's Date: 10/23/2019 PT Individual Time: 0800-0857 PT Individual Time Calculation (min): 57 min   Short Term Goals: Week 2:  PT Short Term Goal 1 (Week 2): STG= LTG due to ELOS  Skilled Therapeutic Interventions/Progress Updates:    Patient received sitting up in wc agreeable to PT. She denies pain at this time. Patient able to negotiate 4 steps with B HR use + CGA-MinA. Foot clearance was improved from yesterday. Patient able to complete STS x5 with no UE assist and no LOB when coming to standing. Patient maintaining modified semi-tandem with R LE on 6" box to complete dynamic UE task crossing midline. MinA provided to help maintain balance. In the // bars, patient ambulating NBOS + retro stepping x4 laps with U UE support + CGA. Shortened step length R LE with L genu recurvatum when stepping backwards. Lateral stepping x4 laps in // bars with good activation of B glute med to prevent excessive pelvic drop. Patient able to propel wc 160ft with SPV back to room. Seatbelt alarm on, call light within reach.   Therapy Documentation Precautions:  Precautions Precautions: Fall Restrictions Weight Bearing Restrictions: No    Therapy/Group: Individual Therapy  Karoline Caldwell, PT, DPT, CBIS 10/23/2019, 7:38 AM

## 2019-10-23 NOTE — Assessment & Plan Note (Signed)
Hospitalized at [facility] inpatient stroke rehabilitation unit

## 2019-10-23 NOTE — Progress Notes (Signed)
Patient ID: Melissa Holt, female   DOB: 1941-01-31, 79 y.o.   MRN: [id number]   Patient wheelchair and bedside commode ordered through [place].   [place], [place]

## 2019-10-23 NOTE — Progress Notes (Signed)
Occupational Therapy Session Note  Patient Details  Name: Melissa Holt MRN: [id number] Date of Birth: [birth year]  Today's Date: 10/23/2019 OT Individual Time: 1003-1100 OT Individual Time Calculation (min): 57 min    Short Term Goals: Week 2:  OT Short Term Goal 1 (Week 2): Continue working on established LTGs set at modified independent to supervision.  Skilled Therapeutic Interventions/Progress Updates:    Session 1: (1003-1100)  Pt up in the wheelchair to start session.  She was taken down to the ADL kitchen via wheelchair for session.  Therapist provided education on completion of simple meal prep with use of the RW for support.  She was given a walker bag for support and then worked on ambulating around the kitchen in order to retrieve and replace items from various places.  She needed min guard assist most of the time with occasional decreased efficiency for advancing the LLE.  On one occasion when bending down to place a skillet back in the cabinet, she needed mod assist secondary to her not being able to extend her knees and hips to come back up to standing.  She was able to then work on LUE strengthening from the wheelchair with use of the UE ergonometer.  She completed 1 set of 3 mins on level 5 resistance using BUEs.  She then completed 1 set of 2 mins and another of 1 minute using the isolated LUE only.  She was able to peddle forward with supervision on the 1st set of 2 mins and then needed min assist to peddle backwards for the 1 minute interval.  She completed task with two more mins of reverse peddling using BUEs.  Finished session with return to the room and pt left sitting up in the wheelchair with the call button and phone in reach and safety belt in place.    Session 2 [phone]) Pt was rolled down to the therapy gym for session.  She worked on [employer] functional use in standing at the high/low table while engaged in Engineer, materials.  Min instructional cueing to use only the left arm  throughout session.  She needed max demonstrational cueing with occasional min assist for standing in order to maintain hip and knee extension on the left side.  While standing, she would slowly fall into flexion requiring therapist to give her cueing at the hips and trunk to maintain upright posture.  She was able to consistently reach with the LUE for grasping and placing checkers at a diminished level.  Rest breaks were needed every 2-3 mins.  Finished session with return to the room and with pt sitting up in the wheelchair.  NT in to take vitals with call button and phone in reach and safety alarm belt in place.    Therapy Documentation Precautions:  Precautions Precautions: Fall Restrictions Weight Bearing Restrictions: No   Pain: Pain Assessment Pain Scale: Faces Pain Score: 0-No pain ADL: See Care Tool Section for some details of mobility and selfcare  Therapy/Group: Individual Therapy  Esbeidy Mclaine OTR/L 10/23/2019, 12:32 PM

## 2019-10-23 NOTE — Progress Notes (Signed)
Patient ID: Melissa Holt, female   DOB: 1940-12-21, 79 y.o.   MRN: [id number]   Sw awaiting confirmation from [name])  to see if he will be available on Thursday for family education. [name] will not be available until Friday, she has taken a vacation day to be able to pick patient up for discharge on Friday. Keep in mind patient and family live in [place]. Sw will await sons response.   [place], [place]

## 2019-10-23 NOTE — Progress Notes (Signed)
Physical Therapy Session Note  Patient Details  Name: Melissa Holt MRN: [id number] Date of Birth: [birth year]  Today's Date: 10/23/2019 PT Individual Time: 1300-1345 PT Individual Time Calculation (min): 45 min   Short Term Goals: Week 2:  PT Short Term Goal 1 (Week 2): STG= LTG due to ELOS  Skilled Therapeutic Interventions/Progress Updates:    Patient received sitting up in wc agreeable to PT. She denies pain, but states that she has to use bathroom urgently. She was able to transfer to toilet with SBA and completed all pericare and clothing management with SBA. PT transported patient to gym for time management. She was able to ambulate 55ft x4 with FWW + SBA. Improved L LE foot clearance and coordination noted. 1 trial with 2# AW to L LE and then removed for next trial with very good carryover for increased step length. Patient completing seated volleyball with back unsupported + 4# dowel noting improved trunk control and appropriate core activation. She was able to complete 5 mins on Nustep for improved, large reciprocal B LE movement. Patient returning to room in wc, seatbelt alarm in place, call light within reach.   Therapy Documentation Precautions:  Precautions Precautions: Fall Restrictions Weight Bearing Restrictions: No    Therapy/Group: Individual Therapy  Karoline Caldwell, PT, DPT, CBIS 10/23/2019, 7:38 AM

## 2019-10-24 ENCOUNTER — Inpatient Hospital Stay (HOSPITAL_COMMUNITY): Payer: Medicare Other | Admitting: Occupational Therapy

## 2019-10-24 ENCOUNTER — Inpatient Hospital Stay (HOSPITAL_COMMUNITY): Payer: Medicare Other | Admitting: Physical Therapy

## 2019-10-24 ENCOUNTER — Inpatient Hospital Stay (HOSPITAL_COMMUNITY): Payer: Medicare Other

## 2019-10-24 LAB — GLUCOSE, CAPILLARY
Glucose-Capillary: 94 mg/dL (ref 70–99)
Glucose-Capillary: 92 mg/dL (ref 70–99)
Glucose-Capillary: 127 mg/dL — ABNORMAL HIGH (ref 70–99)
Glucose-Capillary: 153 mg/dL — ABNORMAL HIGH (ref 70–99)

## 2019-10-24 MED ORDER — GABAPENTIN 300 MG PO CAPS
300.0000 mg | ORAL_CAPSULE | Freq: Every day | ORAL | Status: DC
  Administered 2019-10-25: 300 mg via ORAL
  Filled 2019-10-24: qty 1

## 2019-10-24 NOTE — Progress Notes (Signed)
Physical Therapy Session Note  Patient Details  Name: Melissa Holt MRN: [id number] Date of Birth: 10-11-1940  Today's Date: 10/24/2019 PT Individual Time: 6734-1937 PT Individual Time Calculation (min): 43 min   Short Term Goals: Week 2:  PT Short Term Goal 1 (Week 2): STG= LTG due to ELOS  Skilled Therapeutic Interventions/Progress Updates: Pt presented in w/c agreeable to therapy. Pt denies pain at start of session. Pt propelled approx 156ft with intermittent rests due to fatigue. Pt transported remaining distance to rehab gym. Performed stand pivot transfer with RW to mat CGA. Performed STS with RLE on 2in step for forced use of LLE and LLE strengthening.  Pt participated in L NMR and coordination activities with RW including toe taps to 2in step x 10 and alternating toe taps with RW to 2in step. Pt noted on second bout to consistently clear step with LLE. Pt also participated in toe taps to target on level tile with RW. Pt was able to consistently place foot on marks and clearly lift L foot without moving targets. After brief rest pt ambulated 19ft with RW and CGA. Pt was able to initially clear L foot however with fatigue intermittent toe catching noted. Pt transported back to room at end of session and remained in w/c with belt alarm on, call bell within reach and needs met.      Therapy Documentation Precautions:  Precautions Precautions: Fall Restrictions Weight Bearing Restrictions: No    Therapy/Group: Individual Therapy  Temitope Flammer  Jayke Caul, PTA  10/24/2019, 4:12 PM

## 2019-10-24 NOTE — Progress Notes (Signed)
Physical Therapy Session Note  Patient Details  Name: Melissa Holt MRN: [id number] Date of Birth: 1940-11-17  Today's Date: 10/24/2019 PT Individual Time: 7591-6384 PT Individual Time Calculation (min): 44 min   Short Term Goals: Week 2:  PT Short Term Goal 1 (Week 2): STG= LTG due to ELOS  Skilled Therapeutic Interventions/Progress Updates:    Patient received sitting up in wc agreeable to PT. She denies pain at this time. PT propelled patient in wc to therapy gym for time management. She was able to negotiate 4 steps with B HR + CGA. Patient completing weight shifting task on Biodex encouraging smooth R/L weight shift transitions. Patient then completing limits of stability task on Biodex. Increased difficulty noted with anterior weight shifts requiring tactile cuing from PT to complete this task successfully. Patient able to complete "catch" game on Biodex with reaction time of ~3.4s in order to complete appropriate weight shift. Patient ambulating 158 ft with FWW + CGA. As patient would fatigue, verbal cues needed to encourage larger step/increased foot clearance to L LE. Patient then able to propel herself 119ft to her room. Seatbelt alarm on, call light within reach.   Therapy Documentation Precautions:  Precautions Precautions: Fall Restrictions Weight Bearing Restrictions: No    Therapy/Group: Individual Therapy  Karoline Caldwell, PT, DPT, CBIS 10/24/2019, 7:47 AM

## 2019-10-24 NOTE — Progress Notes (Signed)
Patient ID: Melissa Holt, female   DOB: 1940/03/10, 79 y.o.   MRN: [id number]  Team Conference Report to Patient/Family  Team Conference discussion was reviewed with the patient and caregiver, including goals, any changes in plan of care and target discharge date.  Patient and caregiver express understanding and are in agreement.  The patient has a target discharge date of 10/26/19.  Melissa Holt 10/24/2019, 11:31 AM

## 2019-10-24 NOTE — Progress Notes (Signed)
Patient ID: Melissa Holt, female   DOB: 30-Dec-1940, 79 y.o.   MRN: [id number]  Son confirmed being able to attend family education tomorrow 9/16. [phone]

## 2019-10-24 NOTE — Plan of Care (Signed)
°  Problem: Consults Goal: RH GENERAL PATIENT EDUCATION Description: See Patient Education module for education specifics. Outcome: Progressing   Problem: RH BOWEL ELIMINATION Goal: RH STG MANAGE BOWEL WITH ASSISTANCE Description: STG Manage Bowel with Assistance. Patient will manage bowel with min assist. Outcome: Progressing   Problem: RH BLADDER ELIMINATION Goal: RH STG MANAGE BLADDER WITH ASSISTANCE Description: STG Manage Bladder With  mod  I Assistance  Outcome: Progressing   Problem: RH SKIN INTEGRITY Goal: RH STG SKIN FREE OF INFECTION/BREAKDOWN Description: Manage wit mod I Outcome: Progressing Goal: RH STG MAINTAIN SKIN INTEGRITY WITH ASSISTANCE Description: STG Maintain Skin Integrity With Assistance. Patient will main skin integrity with mod I assist. Outcome: Progressing   Problem: RH SAFETY Goal: RH STG ADHERE TO SAFETY PRECAUTIONS W/ASSISTANCE/DEVICE Description: STG Adhere to Safety Precautions With supervision Assistance/Device. Patient will utilize call bell for assistance to ambulate wit cues/reminders. Patient will not fall during this admission. Outcome: Progressing   Problem: RH PAIN MANAGEMENT Goal: RH STG PAIN MANAGED AT OR BELOW PT'S PAIN GOAL Description: Pain less than 3 on 0-10 pain scale. Outcome: Progressing   Problem: RH KNOWLEDGE DEFICIT GENERAL Goal: RH STG INCREASE KNOWLEDGE OF SELF CARE AFTER HOSPITALIZATION Description: Patient will be able to manage care at discharge wit mod I assist using handouts and c resources Outcome: Progressing   Problem: Consults Goal: RH STROKE PATIENT EDUCATION Description: See Patient Education module for education specifics  Outcome: Progressing   Problem: Consults Goal: RH STROKE PATIENT EDUCATION Description: See Patient Education module for education specifics  Outcome: Progressing Goal: Nutrition Consult-if indicated Description: Pt will have adequate oral intake will voice importance of maintaining  good nutritional intake with verbal cues Outcome: Progressing Goal: Diabetes Guidelines if Diabetic/Glucose > 140 Description: If diabetic or lab glucose is > 140 mg/dl - Initiate Diabetes/Hyperglycemia Guidelines & Document Interventions  Pt will demonstrate use of equipment with min cues, pt will have understanding of diet, medication, and need for f/u appointments with min cues Outcome: Progressing   Problem: RH KNOWLEDGE DEFICIT Goal: RH STG INCREASE KNOWLEDGE OF DIABETES Description: Patient will be able to manage DM with diet, medications and exercise using handouts and educational materials independently Outcome: Progressing Goal: RH STG INCREASE KNOWLEDGE OF HYPERTENSION Description: Pt will voice understanding of risks associated with hypertension and stoke. Pt will voice understanding of medications to control hypertension and dietary restrictions with cues Outcome: Progressing Goal: RH STG INCREASE KNOWLEGDE OF HYPERLIPIDEMIA Description: Patient will voice understanding of effects of hyperlipidemia on health and medications used to control hyperlipidemia with cues Outcome: Progressing Goal: RH STG INCREASE KNOWLEDGE OF STROKE PROPHYLAXIS Description: Patient will voice understanding of stroke prevention and warning signs of stroke. Patient will voice understanding of medications and diet that are prescribed and rationale with cues Outcome: Progressing

## 2019-10-24 NOTE — Patient Care Conference (Signed)
Inpatient RehabilitationTeam Conference and Plan of Care Update Date: 10/24/2019   Time: 09:41 AM  Patient Name: Melissa Holt      Medical Record Number: [id number]  Date of Birth: 12/04/1940 Sex: Female         Room/Bed: 4W02C/4W02C-01 Payor Info: Payor: [employer] MEDICARE / Plan: [facility] MEDICARE / Product Type: *No Product type* /    Admit Date/Time:  10/12/2019  4:35 PM  Primary Diagnosis:  Thrombosis of left anterior cerebral artery  Hospital Problems: Principal Problem:   Thrombosis of left anterior cerebral artery Active Problems:   Gastroesophageal reflux disease   Prediabetes   Labile blood glucose   Steroid-induced hyperglycemia   Essential hypertension   Slow transit constipation   Pain in both feet    Expected Discharge Date: Expected Discharge Date: 10/26/19  Team Members Present: Physician leading conference: Dr. Alysia Penna Care Coodinator Present: Dorien Chihuahua, RN, BSN, CRRN;Christina Ulysses, BSW Nurse Present: [name], RN PT Present:  Ulice Bold, Eduard Clos, PT) OT Present: Clyda Greener, OT SLP Present: Jettie Booze, CF-SLP PPS Coordinator present : Gunnar Fusi, SLP     Current Status/Progress Goal Weekly Team Focus  Bowel/Bladder   Continent of bowel LBM 9/14. Occasional incont of urine-appears to be improving  less incontinence episodes  timed toilet as able.   Swallow/Nutrition/ Hydration   Reg/thin, Mod I  Mod I  goals met, d/c from ST on 10/22/19   ADL's   Supervision for UB selfcare with min guard to min assist for LB selfcare.  Transfers are a min guard assist as well with use of the RW.  LUE is at a diminshed level for selfcare tasks with decreased shoulder strength mostly.  downgraded to supervision level  selfcare retraining, transfer trianing, neuromuscular re-education, balance retraining,  DME education, pt education, therapeutic exercise   Mobility   SPV bed mobility, SPV STS, SPV/CGA stand pivot using FWW, CGA gait up to  2ft, MinA stairs x4 with B HR use  SPV/CGA (for stairs, long distance gait)  B LE strengthening, endurance, L LE NMR, gait tx, stair tx, balance   Communication             Safety/Cognition/ Behavioral Observations  Supervision-Mod I  Supervision-Mod I  goals met, d/c from ST 10/22/19   Pain   Numbness to top of rt foot at times  <3 out of 10  Assess daily/prn and treat pain as needed   Skin   Brusing to abdomen from injections  no areas of skin breakdown  Assess skin q shift     Discharge Planning:  Goal to discharge home. Family will rotate to provide 24/7 care, son building ramp enterance   Team Discussion: No medical issues, note neurogenic pain in right LE and left side weakness, left inattention and fatigue  Patient on target to meet rehab goals: no, goals downgraded for PT/OT  *See Care Plan and progress notes for long and short-term goals.   Revisions to Treatment Plan:   Teaching Needs: Transfers, toileting, supervision requirements, safety, medicaitons, monitoring, etc.   Current Barriers to Discharge: Decreased caregiver support  Possible Resolutions to Barriers: Family education HH follow up and DME ordered     Medical Summary Current Status: RLE pain, LLE weakness, cognitive/safety deficits  Barriers to Discharge: Medical stability   Possible Resolutions to [employer] Focus: balance endurance family ed   Continued Need for Acute Rehabilitation Level of Care: The patient requires daily medical management by a physician with specialized training in  physical medicine and rehabilitation for the following reasons: Direction of a multidisciplinary physical rehabilitation program to maximize functional independence : Yes Medical management of patient stability for increased activity during participation in an intensive rehabilitation regime.: Yes Analysis of laboratory values and/or radiology reports with any subsequent need for medication adjustment and/or  medical intervention. : Yes   I attest that I was present, lead the team conference, and concur with the assessment and plan of the team.   Dorien Chihuahua B 10/24/2019, 3:47 PM

## 2019-10-24 NOTE — Progress Notes (Signed)
Patient ID: Melissa Holt, female   DOB: [birth year], 79 y.o.   MRN: [id number]   Sw attempted to reach patient son again in reference to family education. No, answer, left voicemail. Will. Attempt again this evening.  [place], [place]

## 2019-10-24 NOTE — Progress Notes (Signed)
Occupational Therapy Session Note  Patient Details  Name: Melissa Holt MRN: [id number] Date of Birth: 1940-07-06  Today's Date: 10/24/2019 OT Individual Time: 0800-0910 OT Individual Time Calculation (min): 70 min    Short Term Goals: Week 2:  OT Short Term Goal 1 (Week 2): Continue working on established LTGs set at modified independent to supervision.  Skilled Therapeutic Interventions/Progress Updates:    Session 1: [phone]) Pt worked on bathing and dressing sit to stand at the sink this session.  She was able to complete all UB bathing and dressing with setup and then needed only min guard assist for washing her LB and feet.  Therapist provided foot stool for support to prop her LEs on for washing her feet as well as for donning socks and shoes secondary to the wheelchair being too tall. She was able to donn her brief and pants at min guard assist with min instructional cueing for hand placement on the wheelchair and pulling her feet back under her further.  She completed application of lotion as well as brushing her teeth with setup from seated position. Donning socks was completed with supervision.  She needed mod assist for shoes with the right one being completed at setup with the foot stool and the right requiring mod assist.  She was left sitting in the wheelchair at the end of the session with the call button and phone in reach.       Session 2: ([id number])  Pt was rolled down to the dayroom for session.  She then focused on LUE coordination with use of the UNO stacking game.  Had her work on stacking the small pieces with the left hand in both sitting and in standing.  She continues to demonstrate limited standing endurance with increased trunk flexion and decreased ability to maintain hip extension.  She was able to efficiently pick up and place the pieces with the left hand most of the time.  Returned to the room at the end of the session with pt requesting to stay up in the  wheelchair with the call button and phone in reach and safety belt in place.    Therapy Documentation Precautions:  Precautions Precautions: Fall Restrictions Weight Bearing Restrictions: No   Pain: Pain Assessment Pain Scale: Faces Pain Score: 0-No pain ADL: See Care Tool Section for some details of mobility and selfcare  Therapy/Group: Individual Therapy  Jennifier Smitherman OTR/L 10/24/2019, 9:10 AM

## 2019-10-24 NOTE — Progress Notes (Signed)
[facility] PHYSICAL MEDICINE & REHABILITATION PROGRESS NOTE   Subjective/Complaints:  No issues overnite except RIght foot pain, no pain with standing , no back pain no numbness or tingling  ROS: Denies CP, SOB, N/V/D  Objective:   No results found. Recent Labs    10/22/19 0550  WBC 8.9  HGB 11.2*  HCT 35.9*  PLT 186   No results for input(s): NA, K, CL, CO2, GLUCOSE, BUN, CREATININE, CALCIUM in the last 72 hours.  Intake/Output Summary (Last 24 hours) at 10/24/2019 0853 Last data filed at 10/24/2019 0742 Gross per 24 hour  Intake 1020 ml  Output --  Net 1020 ml     Physical Exam: Vital Signs Blood pressure 132/69, pulse 69, temperature 98.2 F (36.8 C), resp. rate 17, height _0  (1.499 m), weight 71.2 kg, SpO2 99 %.  General: No acute distress Mood and affect are appropriate Heart: Regular rate and rhythm no rubs murmurs or extra sounds Lungs: Clear to auscultation, breathing unlabored, no rales or wheezes Abdomen: Positive bowel sounds, soft nontender to palpation, nondistended Extremities: No clubbing, cyanosis, or edema Skin: No evidence of breakdown, no evidence of rash MSK- trace pedal edema  No pain with ankle ROM, mild plantar heel pain with palpation      Neuro: Alert Motor: RUE/RLE: 5/5 proximal distal LUE/LLE: 4+/5 proximal distal, unchanged Reports pain with palpation of foot diffusely, mild   Assessment/Plan: 1. Functional deficits secondary to acute/subacute CVA with chronic lacunar infarcts which require 3+ hours per day of interdisciplinary therapy in a comprehensive inpatient rehab setting.  Physiatrist is providing close team supervision and 24 hour management of active medical problems listed below.  Physiatrist and rehab team continue to assess barriers to discharge/monitor patient progress toward functional and medical goals  Care Tool:  Bathing    Body parts bathed by patient: Right arm, Left arm, Chest, Abdomen, Front perineal area,  Buttocks, Right upper leg, Left upper leg, Right lower leg, Left lower leg, Face   Body parts bathed by helper: Right lower leg, Left lower leg, Right arm, Buttocks     Bathing assist Assist Level: Contact Guard/Touching assist     Upper Body Dressing/Undressing Upper body dressing   What is the patient wearing?: Pull over shirt    Upper body assist Assist Level: Set up assist    Lower Body Dressing/Undressing Lower body dressing      What is the patient wearing?: Incontinence brief, Pants     Lower body assist Assist for lower body dressing: Contact Guard/Touching assist     Toileting Toileting    Toileting assist Assist for toileting: Supervision/Verbal cueing     Transfers Chair/bed transfer  Transfers assist     Chair/bed transfer assist level: Supervision/Verbal cueing Chair/bed transfer assistive device: Programmer, multimedia   Ambulation assist      Assist level: Supervision/Verbal cueing Assistive device: Walker-rolling Max distance: 30   Walk 10 feet activity   Assist     Assist level: Supervision/Verbal cueing Assistive device: Walker-rolling   Walk 50 feet activity   Assist Walk 50 feet with 2 turns activity did not occur: Safety/medical concerns  Assist level: Contact Guard/Touching assist Assistive device: Walker-rolling    Walk 150 feet activity   Assist Walk 150 feet activity did not occur: Safety/medical concerns  Assist level: Contact Guard/Touching assist Assistive device: Walker-rolling    Walk 10 feet on uneven surface  activity   Assist Walk 10 feet on uneven surfaces activity did  not occur: Safety/medical concerns         Wheelchair     Assist Will patient use wheelchair at discharge?: Yes Type of Wheelchair: Manual    Wheelchair assist level: Supervision/Verbal cueing Max wheelchair distance: 100    Wheelchair 50 feet with 2 turns activity    Assist    Wheelchair 50 feet with 2  turns activity did not occur: Safety/medical concerns   Assist Level: Supervision/Verbal cueing   Wheelchair 150 feet activity     Assist  Wheelchair 150 feet activity did not occur: Safety/medical concerns       Blood pressure 132/69, pulse 69, temperature 98.2 F (36.8 C), resp. rate 17, height _0  (1.499 m), weight 71.2 kg, SpO2 99 %.  Medical Problem List and Plan: 1.  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 Formulized individual treatment plan based on medical history, underlying problem and comorbidities.  2.  Antithrombotics: -DVT/anticoagulation:  Pharmaceutical: Lovenox             -antiplatelet therapy: DAPT x3 weeks followed by Plavix alone 3. Pain Management: Tylenol prn. Kpad and sports cream for myofascial pain upper back, added for right foot as well  Low-dose gabapentin added for foot pain on 9/12  9/13: she feels this helped a little bit. , will increase dose 4. Mood: LCSW to follow for evaluation and support.              -antipsychotic agents: N/A 5. Neuropsych: This patient is capable of making decisions on her own behalf. 6. Skin/Wound Care: Routine pressure-relief measures 7. Fluids/Electrolytes/Nutrition: Monitor intake/output.   BMP within normal limits 9/13 8. HTN: Monitor blood pressure 3 times daily. Continue amlodipine. Very well controlled Vitals:   10/23/19 1917 10/24/19 0252  BP: 125/60 132/69  Pulse: 78 69  Resp: 17 17  Temp: 98.6 F (37 C) 98.2 F (36.8 C)  SpO2: 99% 99%   Controlled on 9/15 9. DDD lumbar spine: Completed prednisone. 10. Obstipation:   Continue bowel meds  Improved 11. Prediabetes: Hgb A1c-5.8. Educate on appropriate diet  Labile on  9/12, monitor for trend 12. GERD/Reflux Sx's  Continue PPI, with improvement 14. Prerenal azotemia: Resolved 15. Acute blood loss anemia: Hgb down to 11.2. Denies weakness.   LOS: 12 days A FACE TO FACE EVALUATION WAS PERFORMED  Charlett Blake 10/24/2019, 8:53 AM

## 2019-10-25 ENCOUNTER — Ambulatory Visit (HOSPITAL_COMMUNITY): Payer: Medicare Other

## 2019-10-25 ENCOUNTER — Inpatient Hospital Stay (HOSPITAL_COMMUNITY): Payer: Medicare Other

## 2019-10-25 ENCOUNTER — Encounter (HOSPITAL_COMMUNITY): Payer: Medicare Other | Admitting: Occupational Therapy

## 2019-10-25 LAB — GLUCOSE, CAPILLARY
Glucose-Capillary: 84 mg/dL (ref 70–99)
Glucose-Capillary: 84 mg/dL (ref 70–99)
Glucose-Capillary: 99 mg/dL (ref 70–99)
Glucose-Capillary: 147 mg/dL — ABNORMAL HIGH (ref 70–99)

## 2019-10-25 LAB — URIC ACID: Uric Acid, Serum: 3.3 mg/dL (ref 2.5–7.1)

## 2019-10-25 MED ORDER — FUROSEMIDE 20 MG PO TABS
20.0000 mg | ORAL_TABLET | Freq: Once | ORAL | Status: AC
  Administered 2019-10-25: 20 mg via ORAL
  Filled 2019-10-25: qty 1

## 2019-10-25 MED ORDER — LOSARTAN POTASSIUM 50 MG PO TABS
25.0000 mg | ORAL_TABLET | Freq: Every day | ORAL | Status: DC
  Administered 2019-10-25 – 2019-10-26 (×2): 25 mg via ORAL
  Filled 2019-10-25 (×2): qty 1

## 2019-10-25 NOTE — Progress Notes (Signed)
Occupational Therapy Discharge Summary  Patient Details  Name: Melissa Holt MRN: [id number] Date of Birth: 1940/03/14  Today's Date: 10/25/2019 OT Individual Time: 1031-5945 OT Individual Time Calculation (min): 53 min   Session Note:  Pt's son in for family education.  Provided hands on education for toilet transfers with use of the RW and he was able to return demonstrate safe assist at pt's current supervision level.  Both pt and her son were educated on therapy putty exercises, FM coordination exercises, and shoulder exercises for the LUE.  Handouts were provided and pt was able to return demonstrate completion of all exercises with supervision.  Medium resistance putty was provided.  Also discussed pt's current level of supervision for bathing and most dressing tasks with the exception of needing min assist for donning the left shoe.  Provided visual reference for a shoe funnel that can be purchased to assist with this.  Also re-emphasized the need for pt to have initial 24 hour supervision for safety with any transfers, selfcare, or meal prep using the RW.  He is arranging between family to provide this.  Pt was left in the wheelchair at the end of the session with the call button and phone in reach.    Patient has met 6 of 7 long term goals due to improved activity tolerance, improved balance, postural control, functional use of  LEFT upper and LEFT lower extremity and improved coordination.  Patient to discharge at overall Supervision level.  Patient's care partner is independent to provide the necessary physical assistance at discharge.    Reasons goals not met: Pt continues to need min assist for donning her left shoe  Recommendation:  Patient will benefit from ongoing skilled OT services in home health setting to continue to advance functional skills in the area of BADL and Reduce care partner burden.  Pt continues to need supervision for functional transfers and mobility as well as for LB  selfcare tasks and toileting.  LUE function continues to improve, but is not WFLs at this time.  Feel she will benefit from continued OT via home health to continue strengthening and continue progression of ADL independence to a modified independent level.    Equipment: 3:1  Reasons for discharge: treatment goals met and discharge from hospital  Patient/family agrees with progress made and goals achieved: Yes  OT Discharge Precautions/Restrictions  Precautions Precautions: Fall Restrictions Weight Bearing Restrictions: No Pain Pain Assessment Pain Scale: Faces Pain Score: 0-No pain ADL ADL Eating: Independent Where Assessed-Eating: Wheelchair Grooming: Modified independent Where Assessed-Grooming: Wheelchair Upper Body Bathing: Setup Where Assessed-Upper Body Bathing: Sitting at sink, Wheelchair Lower Body Bathing: Supervision/safety Where Assessed-Lower Body Bathing: Wheelchair, Standing at sink, Sitting at sink Upper Body Dressing: Independent Where Assessed-Upper Body Dressing: Wheelchair Lower Body Dressing: Minimal assistance Where Assessed-Lower Body Dressing: Wheelchair, Sitting at sink, Standing at sink Toileting: Supervision/safety Where Assessed-Toileting: Bedside Commode Toilet Transfer: Close supervision Toilet Transfer Method: Counselling psychologist: Radiographer, therapeutic: Not assessed Social research officer, government: Not assessed Vision Baseline Vision/History: No visual deficits Wears Glasses: Reading only Patient Visual Report: No change from baseline Vision Assessment?: No apparent visual deficits;Yes Eye Alignment: Within Functional Limits Ocular Range of Motion: Within Functional Limits Alignment/Gaze Preference: Within Defined Limits Tracking/Visual Pursuits: Decreased smoothness of horizontal tracking;Decreased smoothness of vertical tracking Saccades: Within functional limits Visual Fields: No apparent deficits Perception   Perception: Within Functional Limits Praxis Praxis: Intact Cognition Overall Cognitive Status: Within Functional Limits for tasks assessed Arousal/Alertness: Awake/alert Attention:  Focused Sustained Attention: Appears intact Selective Attention: Appears intact Memory: Impaired Memory Impairment: Decreased short term memory Decreased Short Term Memory: Functional complex;Verbal complex Awareness Impairment: Anticipatory impairment Safety/Judgment: Appears intact Comments: Pt occasionally needs cueing for hand placement sit sit to stand transitions. 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 than the LUE Mobility  Transfers Sit to Stand: Supervision/Verbal cueing Stand to Sit: Supervision/Verbal cueing  Trunk/Postural Assessment  Cervical Assessment Cervical Assessment: Exceptions to [facility] (forward head) Thoracic Assessment Thoracic Assessment: Exceptions to [facility] (thoracic rounding) Lumbar Assessment Lumbar Assessment: Exceptions to [facility] (posterior pelvic tilt)  Balance Balance Balance Assessed: Yes Static Sitting Balance Static Sitting - Balance Support: Bilateral upper extremity supported;Feet supported Static Sitting - Level of Assistance: 7: Independent Dynamic Sitting Balance Dynamic Sitting - Balance Support: During functional activity Dynamic Sitting - Level of Assistance: 5: Stand by assistance Static Standing Balance Static Standing - Balance Support: Bilateral upper extremity supported Static  Standing - Level of Assistance: 5: Stand by assistance Dynamic Standing Balance Dynamic Standing - Balance Support: During functional activity Dynamic Standing - Level of Assistance: 5: Stand by assistance Extremity/Trunk Assessment RUE Assessment RUE Assessment: Within Functional Limits LUE Assessment LUE Assessment: Exceptions to [facility] Active Range of Motion (AROM) Comments: Shoulder flexion AROM 0-130 degrees, all other joints AROM WFLs General Strength Comments: Shoulder flexion 3-/5, elbow flexion/ext 3+/5, grip 3+/5.  She is able to demonstrate thumb opposition to the all digits except for the 5th.  Improved FM coordination overall but increased time for use with tying her shoes.   Sanders Manninen OTR/L 10/25/2019, 12:52 PM

## 2019-10-25 NOTE — Progress Notes (Signed)
Patient ID: Melissa Holt, female   DOB: October 20, 1940, 79 y.o.   MRN: [id number]   Sw received call from patient PCP office. Patient is required to call to schedule due to not being seen since 2016. Office contact information has been provided on patient discharge instructions.   [place], [place]

## 2019-10-25 NOTE — Progress Notes (Signed)
Physical Therapy Session Note  Patient Details  Name: Melissa Holt MRN: [id number] Date of Birth: 1940-04-02  Today's Date: 10/25/2019 PT Individual Time: 1000-1043 PT Individual Time Calculation (min): 43 min   Short Term Goals: Week 2:  PT Short Term Goal 1 (Week 2): STG= LTG due to ELOS  Skilled Therapeutic Interventions/Progress Updates:    Patient received sitting up in wc, son present at bed side for family ed. She denies pain, but endorses fatigue due to poor night sleep last night. DME vendor present and dropped off wc + BSC. PT and son lowered wc to appropriate height for patient. Educated son on level of assist that patient needs for ambulation at household distance vs. Community/uneven ground distances, s/s of fatigue and how it will effect patients gait pattern/safety.  Patient able to ambulate 13ft from hospital wc to new wc with SBA and FWW. Patient remains with difficulty propelling wc d/t L UE weakness/incoordination. She was able to negotiate 4 steps with B HR use and CGA, reciprocally step pattern maintained when ascending and step-to when descending. Son able to demonstrate competency with guarding patient. Patient able to transfer into low car with FWW + SPV/verbal cues for sequencing. Patient returning to room in wc, call light within reach, son at bedside.   Therapy Documentation Precautions:  Precautions Precautions: Fall Restrictions Weight Bearing Restrictions: No    Therapy/Group: Individual Therapy  Karoline Caldwell, PT, DPT, CBIS 10/25/2019, 7:40 AM

## 2019-10-25 NOTE — Progress Notes (Addendum)
[facility] PHYSICAL MEDICINE & REHABILITATION PROGRESS NOTE   Subjective/Complaints:  Complains of bilateral foot pain and swelling mainly top of the foot.  Per therapy no pain with weightbearing although the patient states that she does have some pain with standing.  Right side is worse than the left. No history of gout. Reviewed echo normal ejection fraction no history of CHF Reviewed medications was not on any meds prior to admission, now on amlodipine for blood pressure  ROS: Denies CP, SOB, N/V/D  Objective:   No results found. No results for input(s): WBC, HGB, HCT, PLT in the last 72 hours. No results for input(s): NA, K, CL, CO2, GLUCOSE, BUN, CREATININE, CALCIUM in the last 72 hours.  Intake/Output Summary (Last 24 hours) at 10/25/2019 0923 Last data filed at 10/25/2019 0750 Gross per 24 hour  Intake 938 ml  Output --  Net 938 ml     Physical Exam: Vital Signs Blood pressure 140/70, pulse 69, temperature 98.4 F (36.9 C), resp. rate 17, height 4\' 11"  (1.499 m), weight 71.2 kg, SpO2 98 %.  General: No acute distress Mood and affect are appropriate Heart: Regular rate and rhythm no rubs murmurs or extra sounds Lungs: Clear to auscultation, breathing unlabored, no rales or wheezes Abdomen: Positive bowel sounds, soft nontender to palpation, nondistended Extremities: No clubbing, cyanosis, or edema Skin: No evidence of breakdown, no evidence of rash MSK- trace pedal edema left greater than right, there is some tenderness over the MTPs on the right side only.  No erythema no pain with touch. No pain with ankle ROM, mild plantar heel pain with palpation      Neuro: Alert Motor: RUE/RLE: 5/5 proximal distal LUE/LLE: 4+/5 proximal distal, unchanged Reports pain with palpation of foot diffusely, mild   Assessment/Plan: 1. Functional deficits secondary to acute/subacute CVA with chronic lacunar infarcts which require 3+ hours per day of interdisciplinary therapy in a  comprehensive inpatient rehab setting.  Physiatrist is providing close team supervision and 24 hour management of active medical problems listed below.  Physiatrist and rehab team continue to assess barriers to discharge/monitor patient progress toward functional and medical goals  Care Tool:  Bathing    Body parts bathed by patient: Right arm, Left arm, Chest, Abdomen, Front perineal area, Buttocks, Right upper leg, Left upper leg, Right lower leg, Left lower leg, Face   Body parts bathed by helper: Right lower leg, Left lower leg, Right arm, Buttocks     Bathing assist Assist Level: Contact Guard/Touching assist     Upper Body Dressing/Undressing Upper body dressing   What is the patient wearing?: Pull over shirt    Upper body assist Assist Level: Set up assist    Lower Body Dressing/Undressing Lower body dressing      What is the patient wearing?: Incontinence brief, Pants     Lower body assist Assist for lower body dressing: Contact Guard/Touching assist     Toileting Toileting    Toileting assist Assist for toileting: Supervision/Verbal cueing     Transfers Chair/bed transfer  Transfers assist     Chair/bed transfer assist level: Supervision/Verbal cueing Chair/bed transfer assistive device: Programmer, multimedia   Ambulation assist      Assist level: Contact Guard/Touching assist Assistive device: Walker-rolling Max distance: 158   Walk 10 feet activity   Assist     Assist level: Supervision/Verbal cueing Assistive device: Walker-rolling   Walk 50 feet activity   Assist Walk 50 feet with 2 turns activity  did not occur: Safety/medical concerns  Assist level: Contact Guard/Touching assist Assistive device: Walker-rolling    Walk 150 feet activity   Assist Walk 150 feet activity did not occur: Safety/medical concerns  Assist level: Contact Guard/Touching assist Assistive device: Walker-rolling    Walk 10 feet on  uneven surface  activity   Assist Walk 10 feet on uneven surfaces activity did not occur: Safety/medical concerns         Wheelchair     Assist Will patient use wheelchair at discharge?: Yes Type of Wheelchair: Manual    Wheelchair assist level: Supervision/Verbal cueing Max wheelchair distance: 100    Wheelchair 50 feet with 2 turns activity    Assist    Wheelchair 50 feet with 2 turns activity did not occur: Safety/medical concerns   Assist Level: Supervision/Verbal cueing   Wheelchair 150 feet activity     Assist  Wheelchair 150 feet activity did not occur: Safety/medical concerns       Blood pressure 140/70, pulse 69, temperature 98.4 F (36.9 C), resp. rate 17, height 4\' 11"  (1.499 m), weight 71.2 kg, SpO2 98 %.  Medical Problem List and Plan: 1.  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  2.  Antithrombotics: -DVT/anticoagulation:  Pharmaceutical: Lovenox             -antiplatelet therapy: DAPT x3 weeks followed by Plavix alone 3. Pain Management: Tylenol prn. Kpad and sports cream for myofascial pain upper back, added for right foot as well  Low-dose gabapentin added for foot pain on 9/12  9/13: she feels this helped a little bit. , will increase dose The exam today is unremarkable except for some pedal edema bilaterally.  Worse on the left side than on the right side but pain seems to be worse on the right side.  She does have some metatarsalgia on the right side. Check serum urate, foot x-ray to see whether she may have some degenerative changes, consider Solu-Medrol injection depending on work-up  Serum urate neg, Xray shows healed 5th metatarsal fracture (no pain in that area) Edema likely due to amlodipine (Ej fx nl)  Changed to Cozaar Will do one time lasix 20mg  po Foot pumps  tonite TEDs when OOB  4. Mood: LCSW to follow for evaluation and support.              -antipsychotic agents: N/A 5. Neuropsych: This patient is capable of making decisions on her own behalf. 6. Skin/Wound Care: Routine pressure-relief measures 7. Fluids/Electrolytes/Nutrition: Monitor intake/output.   BMP within normal limits 9/13 8. HTN: Monitor blood pressure 3 times daily. Continue amlodipine. Very well controlled Vitals:   10/24/19 1923 10/25/19 0433  BP: (!) 147/81 140/70  Pulse: 77 69  Resp: 16 17  Temp: 97.6 F (36.4 C) 98.4 F (36.9 C)  SpO2: 100% 98%   Controlled on 9/15 however has side effect of lower extremity swelling we will stop amlodipine and trial Cozaar.  Will need to follow-up with PCP 9. DDD lumbar spine: Completed prednisone. 10. Obstipation:   Continue bowel meds  Improved 11. Prediabetes: Hgb A1c-5.8. Educate on appropriate diet  Labile on 9/12, monitor for trend 12. GERD/Reflux Sx's  Continue PPI, with improvement 14. Prerenal azotemia: Resolved, recheck bmet in a.m. 15. Acute blood loss anemia: Hgb down to 11.2. Denies weakness.   LOS: 13 days A FACE TO  FACE EVALUATION WAS PERFORMED  Charlett Blake 10/25/2019, 9:23 AM

## 2019-10-25 NOTE — Progress Notes (Signed)
Physical Therapy Discharge Summary  Patient Details  Name: Melissa Holt MRN: [id number] Date of Birth: 09-Jun-1940   Patient has met 9 of 9 long term goals due to improved activity tolerance, improved balance, improved postural control, increased strength, increased range of motion, ability to compensate for deficits, functional use of  left upper extremity and left lower extremity and improved coordination.  Patient to discharge at an ambulatory level Supervision.   Patient's care partner is independent to provide the necessary physical assistance at discharge.  Recommendation:  Patient will benefit from ongoing skilled PT services in home health setting to continue to advance safe functional mobility, address ongoing impairments in L LE coordination, endurance, standing balance, righting reactions, general strength, and minimize fall risk.  Equipment: 18x18 wc with standard leg rests, anti tippers, break extenders and seat cushion  Reasons for discharge: treatment goals met and discharge from hospital  Patient/family agrees with progress made and goals achieved: Yes   Patient made significant progress toward her goals. She is grossly SPV with all mobility and CGA for stair negotiation and longer gait distances with FWW. Son present for family tx and educated on how to best assist patient. She is safe to d/c home with 24/7 SPV.     PT Discharge Precautions/Restrictions Precautions Precautions: Fall Restrictions Weight Bearing Restrictions: No Vision/Perception  Vision - Assessment Eye Alignment: Within Functional Limits Ocular Range of Motion: Within Functional Limits Alignment/Gaze Preference: Within Defined Limits Tracking/Visual Pursuits: Decreased smoothness of horizontal tracking;Decreased smoothness of vertical tracking Saccades: Within functional limits Perception Perception: Within Functional Limits Praxis Praxis: Intact  Cognition Overall Cognitive Status: Within  Functional Limits for tasks assessed Arousal/Alertness: Awake/alert Orientation Level: Oriented X4 Attention: Focused Sustained Attention: Appears intact Selective Attention: Appears intact Memory: Impaired Memory Impairment: Decreased short term memory Decreased Short Term Memory: Functional complex;Verbal complex Awareness: Impaired Awareness Impairment: Anticipatory impairment Problem Solving: Appears intact Safety/Judgment: Appears intact Comments: Pt occasionally needs cueing for hand placement sit sit to stand transitions. Sensation Sensation Light Touch: Appears Intact Hot/Cold: Not tested Proprioception: Appears Intact Stereognosis: Appears Intact Coordination Gross Motor Movements are Fluid and Coordinated: No Fine Motor Movements are Fluid and Coordinated: No Coordination and Movement Description: Incongruous L LE muscle activation Heel Shin Test: L LE weakness Motor  Motor Motor: Hemiplegia;Ataxia Motor - Skilled Clinical Observations: L LE weakness/ ataxia Motor - Discharge Observations: L LE weakness > L UE weakness  Mobility Bed Mobility Bed Mobility: Rolling Right;Rolling Left;Supine to Sit;Sit to Supine;Scooting to [facility] Rolling Right: Independent Rolling Left: Independent Supine to Sit: Supervision/Verbal cueing Sit to Supine: Supervision/Verbal cueing Scooting to HOB: Supervision/Verbal Cueing Transfers Transfers: Sit to Stand;Stand to Sit;Stand Pivot Transfers Sit to Stand: Supervision/Verbal cueing Stand to Sit: Supervision/Verbal cueing Stand Pivot Transfers: Supervision/Verbal cueing Stand Pivot Transfer Details: Verbal cues for gait pattern;Verbal cues for technique Transfer (Assistive device): Rolling walker  Locomotion  Gait Ambulation: Yes Gait Assistance: Contact Guard/Touching assist Gait Distance (Feet): 168 Feet Assistive device: Rolling walker Gait Assistance Details: Verbal cues for gait pattern;Visual cues for safe use of DME/AE Gait  Assistance Details: CGA for distances >41f, SPV for household distances Gait Gait: Yes Gait Pattern: Impaired Gait Pattern: Decreased step length - left;Decreased hip/knee flexion - left Gait velocity: decreased Stairs / Additional Locomotion Stairs: Yes Stairs Assistance: Contact Guard/Touching assist Stair Management Technique: Two rails Number of Stairs: 4 Height of Stairs: 6 Ramp: Contact Guard/touching assist Curb: CNurse, mental healthMobility: Yes Wheelchair Assistance: SChartered loss adjuster Both  upper extremities Wheelchair Parts Management: Supervision/cueing Distance: 100  Trunk/Postural Assessment  Cervical Assessment Cervical Assessment: Exceptions to [facility] (forward head) Thoracic Assessment Thoracic Assessment: Exceptions to [facility] (increased kyphosis) Lumbar Assessment Lumbar Assessment: Exceptions to [facility] (posterior pelvic tilt) Postural Control Postural Control: Within Functional Limits Righting Reactions: delayed Protective Responses: delayed  Balance Balance Balance Assessed: Yes Static Sitting Balance Static Sitting - Balance Support: No upper extremity supported;Feet supported Static Sitting - Level of Assistance: 7: Independent Dynamic Sitting Balance Dynamic Sitting - Balance Support: During functional activity Dynamic Sitting - Level of Assistance: 5: Stand by assistance Dynamic Sitting - Balance Activities: Lateral lean/weight shifting;Forward lean/weight shifting;[place];Reaching for objects;Reaching across midline Sitting balance - Comments: SBA edge of mat Static Standing Balance Static Standing - Balance Support: No upper extremity supported Static Standing - Level of Assistance: 5: Stand by assistance Dynamic Standing Balance Dynamic Standing - Balance Support: During functional activity Dynamic Standing - Level of Assistance: 5: Stand by assistance Dynamic Standing - Balance  Activities: Lateral lean/weight shifting;Forward lean/weight shifting;Reaching across midline;Reaching for objects Extremity Assessment   RLE Assessment RLE Assessment: Within Functional Limits  LLE Assessment LLE Assessment: Exceptions to [facility] Passive Range of Motion (PROM) Comments: [facility] General Strength Comments: 3-/5 hip flexion (sitting), 4-/5 DF, 4/5 PF, 4-/5 knee extension, 3+/5 knee flexion    Debbora Dus 10/25/2019, 7:40 AM

## 2019-10-25 NOTE — Progress Notes (Signed)
Physical Therapy Session Note  Patient Details  Name: Melissa Holt MRN: [id number] Date of Birth: 1940/06/20  Today's Date: 10/25/2019 PT Individual Time: 1300-1413 PT Individual Time Calculation (min): 73 min   Short Term Goals: Week 2:  PT Short Term Goal 1 (Week 2): STG= LTG due to ELOS  Skilled Therapeutic Interventions/Progress Updates:    Patient received sitting up in wc agreeable to PT. She denies pain. Patient able to ambulate to bathroom with SPV and FWW. MinA needed for pericare after BM and MinA for clothing management. PT propelling patient to therapy gym for time management. Patient with great difficulty completing L step over object with FWW + MinA and verbal cues. Decreased L LE foot clearance with attempt at circumduction for compensation. Modified SLS with R LE on 4" box completing reaching task with L UE and no UE support. CGA provided by PT. As patient would fatigue, L knee flexion increasing and trunk flexion increasing. Patient completing 2x5 mins on NuStep for improved coordination and B LE reciprocal movement. Patient notes increased R LE pain with DF on NuStep, likely related to tight gastroc and/or neurogenic pain. Patient completing seated dynamic balance and UE coordination task with L UE + Min verbal cues for timing. Patient returning to room in wc, seatbelt alarm on, call light within reach.   Therapy Documentation Precautions:  Precautions Precautions: Fall Restrictions Weight Bearing Restrictions: No   Therapy/Group: Individual Therapy  Karoline Caldwell, PT, DPT, CBIS 10/25/2019, 7:39 AM

## 2019-10-26 DIAGNOSIS — I6612 Occlusion and stenosis of left anterior cerebral artery: Secondary | ICD-10-CM

## 2019-10-26 LAB — CBC
HCT: 37.5 % (ref 36.0–46.0)
Hemoglobin: 11.9 g/dL — ABNORMAL LOW (ref 12.0–15.0)
MCH: 30.4 pg (ref 26.0–34.0)
MCHC: 31.7 g/dL (ref 30.0–36.0)
MCV: 95.9 fL (ref 80.0–100.0)
Platelets: 210 10*3/uL (ref 150–400)
RBC: 3.91 MIL/uL (ref 3.87–5.11)
RDW: 12.5 % (ref 11.5–15.5)
WBC: 7 10*3/uL (ref 4.0–10.5)
nRBC: 0 % (ref 0.0–0.2)

## 2019-10-26 LAB — GLUCOSE, CAPILLARY: Glucose-Capillary: 165 mg/dL — ABNORMAL HIGH (ref 70–99)

## 2019-10-26 MED ORDER — PANTOPRAZOLE SODIUM 40 MG PO TBEC: 40.0000 mg | DELAYED_RELEASE_TABLET | Freq: Every day | ORAL | 0 refills | Status: DC

## 2019-10-26 MED ORDER — ATORVASTATIN CALCIUM 80 MG PO TABS: 80.0000 mg | ORAL_TABLET | Freq: Every day | ORAL | 0 refills | Status: DC

## 2019-10-26 MED ORDER — GABAPENTIN 300 MG PO CAPS: 300.0000 mg | ORAL_CAPSULE | Freq: Every day | ORAL | 0 refills | Status: DC

## 2019-10-26 MED ORDER — LOSARTAN POTASSIUM 25 MG PO TABS: 25.0000 mg | ORAL_TABLET | Freq: Every day | ORAL | 0 refills | Status: DC

## 2019-10-26 MED ORDER — CLOPIDOGREL BISULFATE 75 MG PO TABS: 75.0000 mg | ORAL_TABLET | Freq: Every day | ORAL | 0 refills | Status: DC

## 2019-10-26 MED ORDER — ACETAMINOPHEN 325 MG PO TABS: 325.0000 mg | ORAL_TABLET | ORAL | Status: AC | PRN

## 2019-10-26 MED ORDER — MUSCLE RUB 10-15 % EX CREA: 1.0000 "application " | TOPICAL_CREAM | Freq: Two times a day (BID) | CUTANEOUS | 0 refills | Status: DC | PRN

## 2019-10-26 MED ORDER — ASPIRIN 81 MG PO TBEC: 81.0000 mg | DELAYED_RELEASE_TABLET | Freq: Every day | ORAL | 0 refills | Status: DC

## 2019-10-26 NOTE — Progress Notes (Signed)
Patient ID: Melissa Holt, female   DOB: 1940-02-27, 79 y.o.   MRN: [id number]  Patient and family received discharge instructions from [facility] PA-C with verbal understanding. Patient discharged to home with family and patient belongings.  [name], RN, BSN, [place] Office [phone] Cell [phone]

## 2019-10-26 NOTE — Discharge Summary (Signed)
Physician Discharge Summary  Patient ID: Melissa Holt MRN: [id number] DOB/AGE: [birth year] 79 y.o.  Admit date: 10/12/2019 Discharge date: 10/26/2019  Discharge Diagnoses:  Principal Problem:   Thrombosis of left anterior cerebral artery Active Problems:   Gastroesophageal reflux disease   Prediabetes   Essential hypertension   Slow transit constipation   Pain in both feet   Enlarged thyroid   Discharged Condition: stable   Significant Diagnostic Studies:   Labs:  Basic Metabolic Panel: BMP Latest Ref Rng & Units 10/20/2019 10/14/2019 10/13/2019  Glucose 70 - 99 mg/dL 95 99 102(H)  BUN 8 - 23 mg/dL 15 24(H) 27(H)  Creatinine 0.44 - 1.00 mg/dL 0.67 0.90 0.94  Sodium 135 - 145 mmol/L 138 138 137  Potassium 3.5 - 5.1 mmol/L 4.0 4.1 4.0  Chloride 98 - 111 mmol/L 104 107 105  CO2 22 - 32 mmol/L 26 22 22   Calcium 8.9 - 10.3 mg/dL 9.1 9.6 10.1    CBC: CBC Latest Ref Rng & Units 10/26/2019 10/22/2019 10/15/2019  WBC 4.0 - 10.5 K/uL 7.0 8.9 10.5  Hemoglobin 12.0 - 15.0 g/dL 11.9(L) 11.2(L) 13.0  Hematocrit 36 - 46 % 37.5 35.9(L) 40.3  Platelets 150 - 400 K/uL 210 186 204    CBG: Recent Labs  Lab 10/25/19 0604 10/25/19 1116 10/25/19 1750 10/25/19 2040 10/26/19 0721  GLUCAP 84 84 99 147* 165*    Brief HPI:   Melissa Holt is a 79 y.o. female with history of colon cancer with no follow up medical care since surgery. She was admitted on 10/05/19 with one week history of progressive bilateral hip pain with difficulty walking and LLE weakness.  MRI brain was negative for acute changes.  MRI lumbar and thoracic spine revealed acute mild endplate T12 fracture without stenosis and L5-S1 anterolisthesis with subarticular stenosis.  MRI pelvis showed normal lumbosacral plexus with mild bilateral hip OA.  Carotid Dopplers were negative for ICA stenosis and showed incidental large heterogenous right thyroid.  She started developing progressive symptoms on 08/30 with LUE weakness and mild  facial asymmetry.  MRI brain done revealing 2 mm acute/early subacute infarct within right ventral medulla, chronic small vessel infarcts as well as moderate generalized parenchymal atrophy noted.  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  Dr. Leonie Man felt the stroke was due to small vessel disease with resultant LLE weakness, recommended DAPT x3 weeks followed by Plavix alone as well as outpatient follow-up for small cerebral aneurysms.  Dr. Marcello Moores was consulted for input on back and recommended follow-up on outpatient basis for nonsurgical therapy as well as 5-day course of prednisone to help manage symptoms.  Patient continues to have deficits with mobility and ADL tasks.  CIR was recommended due to functional decline.   Hospital Course: Melissa Holt was admitted to rehab 10/12/2019 for inpatient therapies to consist of PT, ST and OT at least three hours five days a week. Past admission physiatrist, therapy team and rehab RN have worked together to provide customized collaborative inpatient rehab. She was maintained on DAPT during her stay and mild drop in H/H noted without signs of bleeding. ASA to stop in one week and recommend repeat CBC in 7-10 days to monitor for stability/recovery. Her blood pressures were monitored on TID basis and have been well controlled. She reported dyspepsia and was exhibited signs of reflux  with wet voice therefore PPI. Obstipation has resolved with adjustment of bowel regimen   Pre-diabetes noted with Hgb A1c- 5.8 and she has been educated on CM diet. Pre-renal azotemia has resolved with follow up BMET revealing improvement in renal status and lytes are WNL. She completed 5 day course of prednisone on 09/06 and reported left foot pain with increase in activity. Local measures were ineffective and gabapentin was  for symptom management. Mood has been stable and she has made steady progress during her rehab stay. She currently requires supervision and will continue to receive follow up PT and OT by [facility] after discharge.    Rehab course: During patient's stay in rehab weekly team conferences were held to monitor patient's progress, set goals and discuss barriers to discharge. At admission, patient required mod to max assist with ADL tasks and mod assist with mobility. She exhibited mild cognitive deficits with decreased speech intelligibility. She  has had improvement in activity tolerance, balance, postural control as well as ability to compensate for deficits. She is able to complete ADL tasks with supervision. She requires supervision for transfers and to ambulate 160' with CGA and use of RW.  Cognition has improved and she is able to complete semi-complex tasks at modified independent level with intermittent supervision and ST signed off on 09/13. Family education was completed regarding all aspects of care and safety.    Disposition: Home  Diet: Heart Healthy.   Special Instructions: 1. Will need to call [facility] for post hospital follow up.  2. Stop ASA after one week.  3. Will need follow up for evaluation of large right thyroid gland and small cerebral aneurysms.   Discharge Instructions    Ambulatory referral to Physical Medicine Rehab   Complete by: As directed    1-2 weeks Tc appt     Allergies as of 10/26/2019   No Known Allergies     Medication List    STOP taking these medications   amLODipine 5 MG tablet Commonly known as: NORVASC   bisacodyl 10 MG suppository Commonly known as: DULCOLAX   lactulose 10 GM/15ML solution Commonly known as: CHRONULAC   polyethylene glycol 17 g packet Commonly known as: MIRALAX / GLYCOLAX   predniSONE 20 MG tablet Commonly known as: DELTASONE   senna-docusate 8.6-50 MG tablet Commonly known as:  Senokot-S     TAKE these medications   acetaminophen 325 MG tablet Commonly known as: TYLENOL Take 1-2 tablets (325-650 mg total) by mouth every 4 (four) hours as needed for mild pain.   aspirin 81 MG EC tablet Take 1 tablet (81 mg total) by mouth daily. Swallow whole. Notes to patient: For one more week.   atorvastatin 80 MG tablet Commonly known as: LIPITOR Take 1 tablet (80 mg total) by mouth daily.   clopidogrel 75 MG tablet Commonly known as: PLAVIX Take 1 tablet (75 mg total) by mouth daily.   gabapentin 300 MG capsule Commonly known as: NEURONTIN Take 1 capsule (300 mg total) by mouth at bedtime. Notes to patient: For nerve pain   losartan 25 MG tablet Commonly known as: COZAAR Take 1 tablet (25 mg total) by mouth daily.   Muscle Rub 10-15 % Crea Apply 1 application topically 2 (two) times daily as needed for muscle pain. Notes to patient: To your foot   pantoprazole 40 MG tablet Commonly known as: PROTONIX Take 1 tablet (40 mg total) by mouth daily. Notes to patient: For reflux/indigestion  Follow-up Information    Kirsteins, Luanna Salk, MD Follow up.   Specialty: Physical Medicine and Rehabilitation Why: Office will call you with follow up appointment Contact information: [place] [address] [phone]        [facility]. Call.   Why: for post stroke follow up Contact information: [address]     [place] [address] [phone]       Vallarie Mare, MD. Call.   Specialty: Neurosurgery Why: for follow up on back. Contact information: [address] [place] [address] [phone]               Signed: Bary Leriche 10/29/2019, 11:12 AM

## 2019-10-26 NOTE — Discharge Instructions (Signed)
Inpatient Rehab Discharge Instructions  [facility] Discharge date and time:  10/26/19  Activities/Precautions/ Functional Status: Activity: no lifting, driving, or strenuous exercise till cleared by MD Diet: cardiac diet Wound Care: none needed   Functional status:  ___ No restrictions     ___ Walk up steps independently _X__ 24/7 supervision/assistance   ___ Walk up steps with assistance ___ Intermittent supervision/assistance  ___ Bathe/dress independently ___ Walk with walker     ___ Bathe/dress with assistance ___ Walk Independently    ___ Shower independently ___ Walk with assistance    _X__ Shower with assistance _X__ No alcohol     ___ Return to work/school ________   Special Instructions: Patient will need to call [facility] to schedule at [phone] due to not being seen since 2016.    COMMUNITY REFERRALS UPON DISCHARGE:    Home Health:   PT     OT     ST                   Agency: [facility] Phone: [phone]  Medical Equipment/Items Ordered: Wheelchair, Bedside Commode                                                 Agency/Supplier: [employer]    STROKE/TIA DISCHARGE INSTRUCTIONS SMOKING Cigarette smoking nearly doubles your risk of having a stroke & is the single most alterable risk factor  If you smoke or have smoked in the last 12 months, you are advised to quit smoking for your health.  Most of the excess cardiovascular risk related to smoking disappears within a year of stopping.  Ask you doctor about anti-smoking medications  [place] Quit Line: 1-800-QUIT NOW  Free Smoking Cessation Classes [phone]  CHOLESTEROL Know your levels; limit fat & cholesterol in your diet  Lipid Panel     Component Value Date/Time   CHOL 272 (H) 10/06/2019 1508   TRIG 93 10/06/2019 1508   HDL 67 10/06/2019 1508   CHOLHDL 4.1 10/06/2019 1508   VLDL 19 10/06/2019 1508   LDLCALC 186 (H) 10/06/2019 1508       Many patients benefit from treatment even if their cholesterol is at goal.  Goal: Total Cholesterol (CHOL) less than 160  Goal:  Triglycerides (TRIG) less than 150  Goal:  HDL greater than 40  Goal:  LDL (LDLCALC) less than 100   BLOOD PRESSURE American Stroke Association blood pressure target is less that 120/80 mm/Hg  Your discharge blood pressure is:  BP: 124/60  Monitor your blood pressure  Limit your salt and alcohol intake  Many individuals will require more than one medication for high blood pressure  DIABETES (A1c is a blood sugar average for last 3 months) Goal HGBA1c is under 7% (HBGA1c is blood sugar average for last 3 months)  Diabetes:     Lab Results  Component Value Date   HGBA1C 5.8 (H) 10/09/2019     Your HGBA1c can be lowered with medications, healthy diet, and exercise.  Check your blood sugar as directed by your physician  Call your physician if you experience unexplained or low blood sugars.  PHYSICAL ACTIVITY/REHABILITATION Goal is 30 minutes at least 4 days per week  Activity: No driving, Therapies: see above Return to work: N/A  Activity decreases your risk of  heart attack and stroke and makes your heart stronger.  It helps control your weight and blood pressure; helps you relax and can improve your mood.  Participate in a regular exercise program.  Talk with your doctor about the best form of exercise for you (dancing, walking, swimming, cycling).  DIET/WEIGHT Goal is to maintain a healthy weight  Your discharge diet is:  Diet Order            DIET SOFT Room service appropriate? Yes; Fluid consistency: Thin  Diet effective now               thin liquids Your height is:  Height: 4\' 11"  (149.9 cm) Your current weight is: Weight: 71.2 kg Your Body Mass Index (BMI) is:  BMI (Calculated): 31.69  Following the type of diet specifically designed for you will help prevent another stroke.  Your goal weight is  Your goal Body Mass Index (BMI)  is 19-24.  Healthy food habits can help reduce 3 risk factors for stroke:  High cholesterol, hypertension, and excess weight.  RESOURCES Stroke/Support Group:  Call [phone]   STROKE EDUCATION PROVIDED/REVIEWED AND GIVEN TO PATIENT Stroke warning signs and symptoms How to activate emergency medical system (call 911). Medications prescribed at discharge. Need for follow-up after discharge. Personal risk factors for stroke. Pneumonia vaccine given:  Flu vaccine given:  My questions have been answered, the writing is legible, and I understand these instructions.  I will adhere to these goals & educational materials that have been provided to me after my discharge from the hospital.     My questions have been answered and I understand these instructions. I will adhere to these goals and the provided educational materials after my discharge from the hospital.  Patient/Caregiver Signature _______________________________ Date __________  Clinician Signature _______________________________________ Date __________  Please bring this form and your medication list with you to all your follow-up doctor's appointments.

## 2019-10-26 NOTE — Progress Notes (Signed)
Inpatient Rehabilitation Care Coordinator  Discharge Note  The overall goal for the admission was met for:   Discharge location: Yes, Home  Length of Stay: Yes, 14 Days  Discharge activity level: Yes, Supervision  Home/community participation: Yes  Services provided included: MD, RD, PT, OT, SLP, RN, CM, TR, Pharmacy, [place]: Private Insurance: [facility] Medicare  Follow-up services arranged: Home Health: [facility]  Comments (or additional information): PT OT ST  Patient/Family verbalized understanding of follow-up arrangements: Yes  Individual responsible for coordination of the follow-up plan: Laverne [phone]  Confirmed correct DME delivered: Dyanne Iha 10/26/2019    Dyanne Iha

## 2019-10-26 NOTE — Progress Notes (Signed)
[place] PHYSICAL MEDICINE & REHABILITATION PROGRESS NOTE   Subjective/Complaints:  Right foot pain has subsided, left foot pain mainly at the heel and ankle area.  ROS: Denies CP, SOB, N/V/D  Objective:   DG Foot Complete Right  Result Date: 10/25/2019 CLINICAL DATA:  Acute right foot pain, swelling EXAM: RIGHT FOOT COMPLETE - 3+ VIEW COMPARISON:  None. FINDINGS: Evidence of healed distal right 5th metatarsal fracture. No acute fracture, subluxation or dislocation. Joint spaces maintained. Soft tissues are intact. IMPRESSION: Healed prior right 5th metatarsal fracture. No acute bony abnormality. Electronically Signed   By: Rolm Baptise M.D.   On: 10/25/2019 11:17   Recent Labs    10/26/19 0745  WBC 7.0  HGB 11.9*  HCT 37.5  PLT 210   No results for input(s): NA, K, CL, CO2, GLUCOSE, BUN, CREATININE, CALCIUM in the last 72 hours.  Intake/Output Summary (Last 24 hours) at 10/26/2019 0934 Last data filed at 10/26/2019 0800 Gross per 24 hour  Intake 1408 ml  Output --  Net 1408 ml     Physical Exam: Vital Signs Blood pressure 128/75, pulse 76, temperature 97.9 F (36.6 C), resp. rate 17, height 4\' 11"  (1.499 m), weight 71.2 kg, SpO2 96 %.  General: No acute distress Mood and affect are appropriate Heart: Regular rate and rhythm no rubs murmurs or extra sounds Lungs: Clear to auscultation, breathing unlabored, no rales or wheezes Abdomen: Positive bowel sounds, soft nontender to palpation, nondistended Extremities: No clubbing, cyanosis, mild pedal edema Skin: No evidence of breakdown, no evidence of rash  Musculoskeletal: Full range of motion in all 4 extremities. No joint swelling, mild pedal edema  No pain with foot or ankle range of motion, no erythema in the left foot.  There is mild pain to palpation at the Achilles insertion onto the calcaneus       Neuro: Alert Motor: RUE/RLE: 5/5 proximal distal LUE/LLE: 4+/5 proximal distal,  unchanged    Assessment/Plan: 1. Functional deficits secondary to acute/subacute CVA with chronic lacunar infarcts which require 3+ hours per day of interdisciplinary therapy in a comprehensive inpatient rehab setting.  Physiatrist is providing close team supervision and 24 hour management of active medical problems listed below.  Physiatrist and rehab team continue to assess barriers to discharge/monitor patient progress toward functional and medical goals  Care Tool:  Bathing    Body parts bathed by patient: Right arm, Left arm, Face   Body parts bathed by helper: Right lower leg, Left lower leg, Right arm, Buttocks     Bathing assist Assist Level: Set up assist     Upper Body Dressing/Undressing Upper body dressing   What is the patient wearing?: Pull over shirt    Upper body assist Assist Level: Independent    Lower Body Dressing/Undressing Lower body dressing      What is the patient wearing?: Incontinence brief, Pants     Lower body assist Assist for lower body dressing: Supervision/Verbal cueing     Toileting Toileting    Toileting assist Assist for toileting: Contact Guard/Touching assist     Transfers Chair/bed transfer  Transfers assist     Chair/bed transfer assist level: Supervision/Verbal cueing Chair/bed transfer assistive device: Armrests   Locomotion Ambulation   Ambulation assist      Assist level: Supervision/Verbal cueing Assistive device: Walker-rolling Max distance: 160   Walk 10 feet activity   Assist     Assist level: Supervision/Verbal cueing Assistive device: Walker-rolling   Walk 50 feet activity  Assist Walk 50 feet with 2 turns activity did not occur: Safety/medical concerns  Assist level: Contact Guard/Touching assist Assistive device: Walker-rolling    Walk 150 feet activity   Assist Walk 150 feet activity did not occur: Safety/medical concerns  Assist level: Contact Guard/Touching  assist Assistive device: Walker-rolling    Walk 10 feet on uneven surface  activity   Assist Walk 10 feet on uneven surfaces activity did not occur: Safety/medical concerns   Assist level: Contact Guard/Touching assist Assistive device: Aeronautical engineer Will patient use wheelchair at discharge?: Yes Type of Wheelchair: Manual    Wheelchair assist level: Supervision/Verbal cueing Max wheelchair distance: 100    Wheelchair 50 feet with 2 turns activity    Assist    Wheelchair 50 feet with 2 turns activity did not occur: Safety/medical concerns   Assist Level: Supervision/Verbal cueing   Wheelchair 150 feet activity     Assist  Wheelchair 150 feet activity did not occur: Safety/medical concerns   Assist Level: Minimal Assistance - Patient > 75%   Blood pressure 128/75, pulse 76, temperature 97.9 F (36.6 C), resp. rate 17, height 4\' 11"  (1.499 m), weight 71.2 kg, SpO2 96 %.  Medical Problem List and Plan: 1.  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  Antithrombotics: -DVT/anticoagulation:  Pharmaceutical: Lovenox             -antiplatelet therapy: DAPT x3 weeks followed by Plavix alone 3. Pain Management: Tylenol prn. Kpad and sports cream for myofascial pain upper back, added for right foot as well  Low-dose gabapentin added for foot pain on 9/12  9/13: she feels this helped a little bit. , will increase dose The exam today is unremarkable except for some pedal edema bilaterally.  Worse on the left side than on the right side but pain seems to be worse on the right side.  She does have some metatarsalgia on the right side. Check serum urate, foot x-ray to see whether she may have some degenerative changes, consider Solu-Medrol injection depending on work-up  Serum urate neg, Xray  shows healed 5th metatarsal fracture (no pain in that area) Edema likely due to amlodipine (Ej fx nl)  Changed to Cozaar Will do one time lasix 20mg  po Foot pumps tonite TEDs when OOB  4. Mood: LCSW to follow for evaluation and support.              -antipsychotic agents: N/A 5. Neuropsych: This patient is capable of making decisions on her own behalf. 6. Skin/Wound Care: Routine pressure-relief measures 7. Fluids/Electrolytes/Nutrition: Monitor intake/output.   BMP within normal limits 9/13 8. HTN: Monitor blood pressure 3 times daily. Continue amlodipine. Very well controlled Vitals:   10/25/19 1931 10/26/19 0445  BP: (!) 141/68 128/75  Pulse: 77 76  Resp: 18 17  Temp: 97.9 F (36.6 C) 97.9 F (36.6 C)  SpO2: 98% 96%   Controlled on 9/15 however has side effect of lower extremity swelling we will stop amlodipine and trial Cozaar.  Will need to follow-up with PCP 9. DDD lumbar spine: Completed prednisone. 10. Obstipation:   Continue bowel meds  Improved 11. Prediabetes: Hgb A1c-5.8. Educate on appropriate diet  Labile on 9/12, monitor for trend 12. GERD/Reflux Sx's  Continue PPI, with improvement 14. Prerenal azotemia: Resolved, recheck bmet in a.m. 15. Acute blood loss  anemia: Hgb down to 11.2. Denies weakness.  16.  Achilles tendinitis left mild advise icing should improve with decreased frequency and intensity of rehabilitation efforts, if worsening may need podiatry as outpatient LOS: 14 days A FACE TO [place] E Shaletha Humble 10/26/2019, 9:34 AM

## 2019-10-29 DIAGNOSIS — E049 Nontoxic goiter, unspecified: Secondary | ICD-10-CM

## 2019-10-30 ENCOUNTER — Telehealth: Payer: Self-pay | Admitting: *Deleted

## 2019-10-30 NOTE — Telephone Encounter (Signed)
Transitional care call completed  Appointment Confirmed  Address confirmed  New patient packet sent  Transitional Care Questions    1. Are you/is patient experiencing any problems since coming home?No problems physically. Medications are expensive.  Medicaid application has been submitted and is pending.  Are there any questions regarding any aspect of care? No  2. Are there any questions regarding medications administration/dosing? Expensive, patient's daughter educated on the use of [place] card.  Avised patient's daughter to request home health social work for community resources Are meds being taken as prescribed? Yes Patient should review meds with caller to confirm   3. Have there been any falls?  No  4. Has Home Health been to the house and/or have they contacted you? RN visit completed, Therapy to follow If not, have you tried to contact them? Can we help you contact them?   5. Are bowels and bladder emptying properly? Yes  Are there any unexpected incontinence issues? No If applicable, is patient following bowel/bladder programs?   6. Any fevers, problems with breathing, unexpected pain? No, No, back and feet pain  7. Are there any skin problems or new areas of breakdown?  No  8. Has the patient/family member arranged specialty MD follow up (ie cardiology/neurology/renal/surgical/etc)? No  Can we help arrange?   9. Does the patient need any other services or support that we can help arrange? Patients daughter feels that patient would benefit from a semi-electric hospital bed with side rails.   Home health aide would be helpful as well.   10. Are caregivers following through as expected in assisting the patient? Yes  11. Has the patient quit smoking, drinking alcohol, or using drugs as recommended?  Patient is not doing these things

## 2019-11-08 ENCOUNTER — Encounter: Payer: Self-pay | Admitting: Physical Medicine & Rehabilitation

## 2019-11-08 ENCOUNTER — Encounter: Payer: Medicare Other | Attending: Physical Medicine & Rehabilitation | Admitting: Physical Medicine & Rehabilitation

## 2019-11-08 ENCOUNTER — Other Ambulatory Visit: Payer: Self-pay

## 2019-11-08 VITALS — BP 145/87 | HR 77 | Temp 98.7°F | Ht 59.0 in | Wt 150.0 lb

## 2019-11-08 DIAGNOSIS — I6381 Other cerebral infarction due to occlusion or stenosis of small artery: Secondary | ICD-10-CM | POA: Diagnosis present

## 2019-11-08 MED ORDER — CLOPIDOGREL BISULFATE 75 MG PO TABS
75.0000 mg | ORAL_TABLET | Freq: Every day | ORAL | 0 refills | Status: DC
Start: 2019-11-08 — End: 2019-11-23

## 2019-11-08 MED ORDER — ATORVASTATIN CALCIUM 80 MG PO TABS
80.0000 mg | ORAL_TABLET | Freq: Every day | ORAL | 0 refills | Status: DC
Start: 2019-11-08 — End: 2019-11-23

## 2019-11-08 NOTE — Progress Notes (Addendum)
Subjective:    Patient ID: Melissa Holt, female    DOB: [birth year], 79 y.o.   MRN: [id number] 79 y.o. female with history of colon cancer with no follow up medical care since surgery. She was admitted on 10/05/19 with one week history of progressive bilateral hip pain with difficulty walking and LLE weakness.  MRI brain was negative for acute changes.  MRI lumbar and thoracic spine revealed acute mild endplate T12 fracture without stenosis and L5-S1 anterolisthesis with subarticular stenosis.  MRI pelvis showed normal lumbosacral plexus with mild bilateral hip OA.  Carotid Dopplers were negative for ICA stenosis and showed incidental large heterogenous right thyroid.  She started developing progressive symptoms on 08/30 with LUE weakness and mild facial asymmetry.  MRI brain done revealing 2 mm acute/early subacute infarct within right ventral medulla, chronic small vessel infarcts as well as moderate generalized parenchymal atrophy noted.   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  Dr. Leonie Man felt the stroke was due to small vessel disease with resultant LLE weakness, recommended DAPT x3 weeks followed by Plavix alone as well as outpatient follow-up for small cerebral aneurysms.  Dr. Marcello Moores was consulted for input on back and recommended follow-up on outpatient basis for nonsurgical therapy as well as 5-day course of prednisone to help manage symptoms.  Patient continues to have deficits with mobility and ADL tasks.  CIR was recommended due to functional decline. Admit date: 10/12/2019 Discharge date: 10/26/2019  HPI Having difficulty with bed mobility , awaiting hospital bed.  PT and OT with home health   Needs a little bit of help with dressing  Does bathing at sink No falls since discharge  Receives assistance from her  daughter  Has not seen other MDs yet PCP visit not until December  Walks with walker  Pain Inventory Average Pain 0 Pain Right Now 0 My pain is No Pain  LOCATION OF PAIN  Left arm & left leg weakness   BOWEL Number of stools per week: 7 Oral laxative use No  Type of laxative NO Enema or suppository use No  History of colostomy No  Incontinent No   BLADDER Normal In and out cath, frequency N/A Able to self cath N/A Bladder incontinence No  Frequent urination No  Leakage with coughing No  Difficulty starting stream No  Incomplete bladder emptying Yes    Mobility walk with assistance use a walker ability to climb steps?  no do you drive?  no use a wheelchair needs help with transfers Do you have any goals in this area?  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 Lung cancer Brother    Social History   Socioeconomic History  . Marital status: Widowed    Spouse name: Not on file  . Number of children: Not on file  . Years of education: Not on file  . Highest education level: Not on file  Occupational History  . Not on file  Tobacco Use  . Smoking status: Never Smoker  . Smokeless tobacco: Current User    Types: Snuff  Vaping Use  . Vaping Use: Never used  Substance and Sexual Activity  .  Alcohol use: Yes  . Drug use: Not Currently  . Sexual activity: Not Currently  Other Topics Concern  . Not on file  Social History Narrative  . Not on file   Social Determinants of Health   Financial Resource Strain:   . Difficulty of Paying Living Expenses: Not on file  Food Insecurity:   . Worried About Charity fundraiser in the Last Year: Not on file  . Ran Out of Food in the Last Year: Not on file  Transportation Needs:   . Lack of Transportation (Medical):  Not on file  . Lack of Transportation (Non-Medical): Not on file  Physical Activity:   . Days of Exercise per Week: Not on file  . Minutes of Exercise per Session: Not on file  Stress:   . Feeling of Stress : Not on file  Social Connections:   . Frequency of Communication with Friends and Family: Not on file  . Frequency of Social Gatherings with Friends and Family: Not on file  . Attends Religious Services: Not on file  . Active Member of Clubs or Organizations: Not on file  . Attends Archivist Meetings: Not on file  . Marital Status: Not on file   Past Surgical History:  Procedure Laterality Date  . COLON SURGERY     Past Medical History:  Diagnosis Date  . Cancer (HCC)    BP (!) 145/87   Pulse 77   Temp 98.7 F (37.1 C)   Ht 4\' 11"  (1.499 m)   Wt 150 lb (68 kg)   SpO2 98%   BMI 30.30 kg/m   Opioid Risk Score:   Fall Risk Score:  `1  Depression screen PHQ 2/9  Depression screen PHQ 2/9 11/08/2019  Decreased Interest 0  Down, Depressed, Hopeless 0  PHQ - 2 Score 0  Altered sleeping 3  Tired, decreased energy 1  Change in appetite 0  Feeling bad or failure about yourself  0  Trouble concentrating 0  Moving slowly or fidgety/restless 0  Suicidal thoughts 0  PHQ-9 Score 4   Review of Systems  HENT: Negative.   Eyes: Negative.   Respiratory: Negative.   Cardiovascular: Negative.   Gastrointestinal: Negative.   Endocrine: Negative.   Genitourinary: Negative.   Musculoskeletal: Negative.   Skin: Negative.   Allergic/Immunologic: Negative.   Neurological: Positive for weakness.       Weakness in left arm and left leg  Hematological: Negative.   All other systems reviewed and are negative.      Objective:   Physical Exam Vitals and nursing note reviewed.  Constitutional:      Appearance: She is obese.  HENT:     Head: Normocephalic and atraumatic.  Eyes:     Extraocular Movements: Extraocular movements intact.     Conjunctiva/sclera:  Conjunctivae normal.     Pupils: Pupils are equal, round, and reactive to light.  Musculoskeletal:     Cervical back: Normal range of motion. No tenderness.  Neurological:     Mental Status: She is oriented to person, place, and time.  Psychiatric:        Mood and Affect: Mood normal.        Behavior: Behavior normal.   Motor strength is 3 - at the left deltoid bicep tricep grip, hip flexors, 4/5 knee extensors, 3 - ankle dorsiflexor on the left. 4/5 in the right deltoid bicep tricep grip hip flexion knee extension, ankle dorsiflexion  Assessment & Plan:  #1.  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  Continue home health PT OT May benefit from electric hospital bed Referral to neurology for follow-up of stroke plus cerebral aneurysms. Physical medicine rehab follow-up on as-needed basis  FMLA form completed for patient's daughter who is caring for patient providing ADLs such as dressing and bathing assistance taking her to appointments, cooking

## 2019-11-08 NOTE — Patient Instructions (Addendum)
Dr Leonie Man is the Neurologist I referred you to.  It is very important that you follow up with the neurologist to prevent further stroke  Also important to see your primary MD

## 2019-11-23 ENCOUNTER — Telehealth: Payer: Self-pay | Admitting: *Deleted

## 2019-11-23 MED ORDER — CLOPIDOGREL BISULFATE 75 MG PO TABS
75.0000 mg | ORAL_TABLET | Freq: Every day | ORAL | 0 refills | Status: DC
Start: 2019-11-23 — End: 2020-01-25

## 2019-11-23 MED ORDER — GABAPENTIN 300 MG PO CAPS
300.0000 mg | ORAL_CAPSULE | Freq: Every day | ORAL | 0 refills | Status: DC
Start: 2019-11-23 — End: 2019-12-26

## 2019-11-23 MED ORDER — ATORVASTATIN CALCIUM 80 MG PO TABS
80.0000 mg | ORAL_TABLET | Freq: Every day | ORAL | 0 refills | Status: DC
Start: 2019-11-23 — End: 2020-01-25

## 2019-11-23 MED ORDER — LOSARTAN POTASSIUM 25 MG PO TABS
25.0000 mg | ORAL_TABLET | Freq: Every day | ORAL | 0 refills | Status: DC
Start: 2019-11-23 — End: 2019-12-26

## 2019-11-23 NOTE — Telephone Encounter (Signed)
Melissa Holt is going to be out of her medications on Monday.  She can not get in to see her PCP until December and she was not on these medications before her hospitalization. She does have an appt with Dr Leonie Man on Nov 3.  Can we give her a refill on her medications?

## 2019-11-23 NOTE — Telephone Encounter (Signed)
Refilled meds requested

## 2019-11-23 NOTE — Telephone Encounter (Signed)
Notified. 

## 2019-12-12 ENCOUNTER — Other Ambulatory Visit: Payer: Self-pay

## 2019-12-12 ENCOUNTER — Encounter: Payer: Self-pay | Admitting: Neurology

## 2019-12-12 ENCOUNTER — Ambulatory Visit (INDEPENDENT_AMBULATORY_CARE_PROVIDER_SITE_OTHER): Payer: Medicare Other | Admitting: Neurology

## 2019-12-12 VITALS — BP 157/72 | HR 89 | Ht 59.0 in | Wt 164.6 lb

## 2019-12-12 DIAGNOSIS — G8110 Spastic hemiplegia affecting unspecified side: Secondary | ICD-10-CM

## 2019-12-12 DIAGNOSIS — I6389 Other cerebral infarction: Secondary | ICD-10-CM | POA: Diagnosis not present

## 2019-12-12 NOTE — Patient Instructions (Signed)
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 I also advised the patient to eat a healthy diet with plenty of whole grains, cereals, fruits and vegetables, exercise regularly and maintain ideal body weight .I recommend she continue ongoing home physical and occupational therapy and when this is over due outpatient therapy as well.  She was advised to use a walker at all times and we discussed fall safety precautions.  Followup in the future with my nurse practitioner Janett Billow in 3 months or call earlier if necessary.  Fall Prevention in the Home, Adult Falls can cause injuries. They can happen to people of all ages. There are many things you can do to make your home safe and to help prevent falls. Ask for help when making these changes, if needed. What actions can I take to prevent falls? General Instructions  Use good lighting in all rooms. Replace any light bulbs that burn out.  Turn on the lights when you go into a dark area. Use night-lights.  Keep items that you use often in easy-to-reach places. Lower the shelves around your home if necessary.  Set up your furniture so you have a clear path. Avoid moving your furniture around.  Do not have throw rugs and other things on the floor that can make you trip.  Avoid walking on wet floors.  If any of your floors are uneven, fix them.  Add color or contrast paint or tape to clearly mark and help you see: ? Any grab bars or handrails. ? First and last steps of stairways. ? Where the edge of each step is.  If you use a stepladder: ? Make sure that it is fully  opened. Do not climb a closed stepladder. ? Make sure that both sides of the stepladder are locked into place. ? Ask someone to hold the stepladder for you while you use it.  If there are any pets around you, be aware of where they are. What can I do in the bathroom?      Keep the floor dry. Clean up any water that spills onto the floor as soon as it happens.  Remove soap buildup in the tub or shower regularly.  Use non-skid mats or decals on the floor of the tub or shower.  Attach bath mats securely with double-sided, non-slip rug tape.  If you need to sit down in the shower, use a plastic, non-slip stool.  Install grab bars by the toilet and in the tub and shower. Do not use towel bars as grab bars. What can I do in the bedroom?  Make sure that you have a light by your bed that is easy to reach.  Do not use any sheets or blankets that are too big for your bed. They should not hang down onto the floor.  Have a firm chair that has side arms. You can use this for support while you get dressed. What can I do in the kitchen?  Clean up any spills right away.  If you need to reach something above you, use a strong step stool that has a grab bar.  Keep electrical cords out of the way.  Do  not use floor polish or wax that makes floors slippery. If you must use wax, use non-skid floor wax. What can I do with my stairs?  Do not leave any items on the stairs.  Make sure that you have a light switch at the top of the stairs and the bottom of the stairs. If you do not have them, ask someone to add them for you.  Make sure that there are handrails on both sides of the stairs, and use them. Fix handrails that are broken or loose. Make sure that handrails are as long as the stairways.  Install non-slip stair treads on all stairs in your home.  Avoid having throw rugs at the top or bottom of the stairs. If you do have throw rugs, attach them to the floor with carpet tape.  Choose a  carpet that does not hide the edge of the steps on the stairway.  Check any carpeting to make sure that it is firmly attached to the stairs. Fix any carpet that is loose or worn. What can I do on the outside of my home?  Use bright outdoor lighting.  Regularly fix the edges of walkways and driveways and fix any cracks.  Remove anything that might make you trip as you walk through a door, such as a raised step or threshold.  Trim any bushes or trees on the path to your home.  Regularly check to see if handrails are loose or broken. Make sure that both sides of any steps have handrails.  Install guardrails along the edges of any raised decks and porches.  Clear walking paths of anything that might make someone trip, such as tools or rocks.  Have any leaves, snow, or ice cleared regularly.  Use sand or salt on walking paths during winter.  Clean up any spills in your garage right away. This includes grease or oil spills. What other actions can I take?  Wear shoes that: ? Have a low heel. Do not wear high heels. ? Have rubber bottoms. ? Are comfortable and fit you well. ? Are closed at the toe. Do not wear open-toe sandals.  Use tools that help you move around (mobility aids) if they are needed. These include: ? Canes. ? Walkers. ? Scooters. ? Crutches.  Review your medicines with your doctor. Some medicines can make you feel dizzy. This can increase your chance of falling. Ask your doctor what other things you can do to help prevent falls. Where to find more information  Centers for Disease Control and Prevention, STEADI: https://garcia.biz/  [employer] on Aging: BrainJudge.co.uk Contact a doctor if:  You are afraid of falling at home.  You feel weak, drowsy, or dizzy at home.  You fall at home. Summary  There are many simple things that you can do to make your home safe and to help prevent falls.  Ways to make your home safe include removing  tripping hazards and installing grab bars in the bathroom.  Ask for help when making these changes in your home. This information is not intended to replace advice given to you by your health care provider. Make sure you discuss any questions you have with your health care provider. Document Revised: 05/18/2018 Document Reviewed: 09/09/2016 Elsevier Patient Education  [place].  Stroke Prevention Some medical conditions and behaviors are associated with a higher chance of having a stroke. You can help prevent a stroke by making nutrition, lifestyle, and other changes, including managing any medical conditions  you may have. What nutrition changes can be made?   Eat healthy foods. You can do this by: ? Choosing foods high in fiber, such as fresh fruits and vegetables and whole grains. ? Eating at least 5 or more servings of fruits and vegetables a day. Try to fill half of your plate at each meal with fruits and vegetables. ? Choosing lean protein foods, such as lean cuts of meat, poultry without skin, fish, tofu, beans, and nuts. ? Eating low-fat dairy products. ? Avoiding foods that are high in salt (sodium). This can help lower blood pressure. ? Avoiding foods that have saturated fat, trans fat, and cholesterol. This can help prevent high cholesterol. ? Avoiding processed and premade foods.  Follow your health care provider's specific guidelines for losing weight, controlling high blood pressure (hypertension), lowering high cholesterol, and managing diabetes. These may include: ? Reducing your daily calorie intake. ? Limiting your daily sodium intake to 1,500 milligrams (mg). ? Using only healthy fats for cooking, such as olive oil, canola oil, or sunflower oil. ? Counting your daily carbohydrate intake. What lifestyle changes can be made?  Maintain a healthy weight. Talk to your health care provider about your ideal weight.  Get at least 30 minutes of moderate physical  activity at least 5 days a week. Moderate activity includes brisk walking, biking, and swimming.  Do not use any products that contain nicotine or tobacco, such as cigarettes and e-cigarettes. If you need help quitting, ask your health care provider. It may also be helpful to avoid exposure to secondhand smoke.  Limit alcohol intake to no more than 1 drink a day for nonpregnant women and 2 drinks a day for men. One drink equals 12 oz of beer, 5 oz of wine, or 1 oz of hard liquor.  Stop any illegal drug use.  Avoid taking birth control pills. Talk to your health care provider about the risks of taking birth control pills if: ? You are over 55 years old. ? You smoke. ? You get migraines. ? You have ever had a blood clot. What other changes can be made?  Manage your cholesterol levels. ? Eating a healthy diet is important for preventing high cholesterol. If cholesterol cannot be managed through diet alone, you may also need to take medicines. ? Take any prescribed medicines to control your cholesterol as told by your health care provider.  Manage your diabetes. ? Eating a healthy diet and exercising regularly are important parts of managing your blood sugar. If your blood sugar cannot be managed through diet and exercise, you may need to take medicines. ? Take any prescribed medicines to control your diabetes as told by your health care provider.  Control your hypertension. ? To reduce your risk of stroke, try to keep your blood pressure below 130/80. ? Eating a healthy diet and exercising regularly are an important part of controlling your blood pressure. If your blood pressure cannot be managed through diet and exercise, you may need to take medicines. ? Take any prescribed medicines to control hypertension as told by your health care provider. ? Ask your health care provider if you should monitor your blood pressure at home. ? Have your blood pressure checked every year, even if your  blood pressure is normal. Blood pressure increases with age and some medical conditions.  Get evaluated for sleep disorders (sleep apnea). Talk to your health care provider about getting a sleep evaluation if you snore a lot or have excessive sleepiness.  Take over-the-counter and prescription medicines only as told by your health care provider. Aspirin or blood thinners (antiplatelets or anticoagulants) may be recommended to reduce your risk of forming blood clots that can lead to stroke.  Make sure that any other medical conditions you have, such as atrial fibrillation or atherosclerosis, are managed. What are the warning signs of a stroke? The warning signs of a stroke can be easily remembered as BEFAST.  B is for balance. Signs include: ? Dizziness. ? Loss of balance or coordination. ? Sudden trouble walking.  E is for eyes. Signs include: ? A sudden change in vision. ? Trouble seeing.  F is for face. Signs include: ? Sudden weakness or numbness of the face. ? The face or eyelid drooping to one side.  A is for arms. Signs include: ? Sudden weakness or numbness of the arm, usually on one side of the body.  S is for speech. Signs include: ? Trouble speaking (aphasia). ? Trouble understanding.  T is for time. ? These symptoms may represent a serious problem that is an emergency. Do not wait to see if the symptoms will go away. Get medical help right away. Call your local emergency services (911 in the [place] Do not drive yourself to the hospital.  Other signs of stroke may include: ? A sudden, severe headache with no known cause. ? Nausea or vomiting. ? Seizure. Where to find more information For more information, visit:  American Stroke Association: www.strokeassociation.org  National Stroke Association: www.stroke.org Summary  You can prevent a stroke by eating healthy, exercising, not smoking, limiting alcohol intake, and managing any medical conditions you may  have.  Do not use any products that contain nicotine or tobacco, such as cigarettes and e-cigarettes. If you need help quitting, ask your health care provider. It may also be helpful to avoid exposure to secondhand smoke.  Remember BEFAST for warning signs of stroke. Get help right away if you or a loved one has any of these signs. This information is not intended to replace advice given to you by your health care provider. Make sure you discuss any questions you have with your health care provider. Document Revised: 01/07/2017 Document Reviewed: 03/02/2016 Elsevier Patient Education  2020 [employer].

## 2019-12-12 NOTE — Progress Notes (Signed)
[facility] [address] [place]. [place] [address] [phone]       OFFICE FOLLOW-UP NOTE  [name] Date of Birth:  1940-05-28 Medical Record Number:  [id number]   HPI: Melissa Holt is a 79 year old African-American lady seen today for initial office follow-up visit following consultation for stroke in August 2021.  She is accompanied by her daughter.  History is obtained from them, review of electronic medical records and I have personally reviewed available imaging films in PACS.  She has a past medical history of colon cancer, hypertension, hyperlipidemia who presented to [facility] on 10/06/2019 with several days history of difficulty walking with legs giving out in the bathroom especially left leg.  She initially had an MRI on 10/05/2019 which did not show any acute stroke.  She returned back with falls and leg weakness and follow-up MRI on 10/08/2019 showed a right medullary ventral infarct.  CT angiogram showed no flow in the right vertebral artery with multifocal intracranial atherosclerotic changes with poor flow in V3 and V4 segments of the right vertebral artery likely due to high-grade stenosis or occlusion.  There were moderate to severe stenosis noted of the cavernous and paraclinoid right ICA as well as proximal A1 segment of the anterior cerebral artery bilaterally..  2D echo showed normal ejection fraction.  LDL cholesterol elevated 186 mg percent and hemoglobin A1c was 5.8.  Patient was transferred to inpatient rehab and progressed well and was discharged home on 10/26/2019.  She is since getting home physical and occupational therapy.  She has been walking with a walker.  She still has left-sided weakness and stiffness.  She has had no falls or injuries.  She was on aspirin Plavix for 3 weeks and is currently on Plavix alone which is tolerating well without bruising or bleeding.  She remains on Lipitor which is tolerating well without muscle aches and  pains.  She states her blood pressure at home is well controlled though it is elevated in office today at 157/72.  Her daughter has moved in with her and patient needs assistance with activities like going to the bathroom, cooking and she is not driving. ROS:   14 system review of systems is positive for what happened to the next patient. PMH:  Past Medical History:  Diagnosis Date  . Cancer [facility])     Social History:  Social History   Socioeconomic History  . Marital status: Widowed    Spouse name: Not on file  . Number of children: Not on file  . Years of education: Not on file  . Highest education level: Not on file  Occupational History  . Not on file  Tobacco Use  . Smoking status: Never Smoker  . Smokeless tobacco: Current User    Types: Snuff  Vaping Use  . Vaping Use: Never used  Substance and Sexual Activity  . Alcohol use: Yes  . Drug use: Not Currently  . Sexual activity: Not Currently  Other Topics Concern  . Not on file  Social History Narrative   Lives alone   Right Handed   Drinks 1-2 cups caffeine daily   Social Determinants of Health   Financial Resource Strain:   . Difficulty of Paying Living Expenses: Not on file  Food Insecurity:   . Worried About Charity fundraiser in the Last Year: Not on file  . Ran Out of Food in the Last Year: Not on file  Transportation Needs:   .  Lack of Transportation (Medical): Not on file  . Lack of Transportation (Non-Medical): Not on file  Physical Activity:   . Days of Exercise per Week: Not on file  . Minutes of Exercise per Session: Not on file  Stress:   . Feeling of Stress : Not on file  Social Connections:   . Frequency of Communication with Friends and Family: Not on file  . Frequency of Social Gatherings with Friends and Family: Not on file  . Attends Religious Services: Not on file  . Active Member of Clubs or Organizations: Not on file  . Attends Archivist Meetings: Not on file  . Marital  Status: Not on file  Intimate Partner Violence:   . Fear of Current or Ex-Partner: Not on file  . Emotionally Abused: Not on file  . Physically Abused: Not on file  . Sexually Abused: Not on file    Medications:   Current Outpatient Medications on File Prior to Visit  Medication Sig Dispense Refill  . acetaminophen (TYLENOL) 325 MG tablet Take 1-2 tablets (325-650 mg total) by mouth every 4 (four) hours as needed for mild pain.    Marland Kitchen atorvastatin (LIPITOR) 80 MG tablet Take 1 tablet (80 mg total) by mouth daily. 30 tablet 0  . clopidogrel (PLAVIX) 75 MG tablet Take 1 tablet (75 mg total) by mouth daily. 30 tablet 0  . gabapentin (NEURONTIN) 300 MG capsule Take 1 capsule (300 mg total) by mouth at bedtime. 30 capsule 0  . losartan (COZAAR) 25 MG tablet Take 1 tablet (25 mg total) by mouth daily. 30 tablet 0  . Menthol-Methyl Salicylate (MUSCLE RUB) 10-15 % CREA Apply 1 application topically 2 (two) times daily as needed for muscle pain. 85 g 0  . aspirin 81 MG EC tablet Take 1 tablet (81 mg total) by mouth daily. Swallow whole. 7 tablet 0   No current facility-administered medications on file prior to visit.    Allergies:  No Known Allergies  Physical Exam General: Frail elderly African-American lady, seated, in no evident distress Head: head normocephalic and atraumatic.  Neck: supple with no carotid or supraclavicular bruits Cardiovascular: regular rate and rhythm, no murmurs Musculoskeletal: Mild kyphoscoliosis Skin:  no rash/petichiae Vascular:  Normal pulses all extremities Respiratory system bilateral wheezing heard. Vitals:   12/12/19 0948  BP: (!) 157/72  Pulse: 89   Neurologic Exam Mental Status: Awake and fully alert. Oriented to place and time. Recent and remote memory intact. Attention span, concentration and fund of knowledge appropriate. Mood and affect appropriate.  Cranial Nerves: Fundoscopic exam reveals sharp disc margins. Pupils equal, briskly reactive to  light. Extraocular movements full without nystagmus. Visual fields full to confrontation. Hearing intact. Facial sensation intact. Face, tongue, palate moves normally and symmetrically.  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  Tone is increased on the left compared to the right.  Normal strength on the right.. Sensory.: intact to touch ,pinprick .position and vibratory sensation.  Coordination: Finger-to-nose and needle coordination impaired on the left. Gait and Station: Arises from chair with  difficulty. Stance is normal. Gait demonstrates spastic hemiparesis with dragging of the left foot using a wheeled walker.   reflexes: 2+ and asymmetric and brisker on the left. Toes downgoing.   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  Vascular risk factors of hypertension, hyperlipidemia, intracranial occlusive disease.     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 I also advised the patient to eat a healthy diet with plenty of whole grains, cereals, fruits and vegetables, exercise regularly and maintain ideal body weight .I recommend she continue ongoing home physical and occupational therapy and when this is over due outpatient therapy as well.  She was advised to use a walker at all times and we discussed fall safety precautions.  Followup in the future with my nurse  practitioner Janett Billow in 3 months or call earlier if necessary. Greater than 50% of time during this 25 minute visit was spent on counseling,explanation of diagnosis, planning of further management, discussion with patient and family and coordination of care Antony Contras, MD Note: This document was prepared with digital dictation and possible smart phrase technology. Any transcriptional errors that result from this process are unintentional

## 2019-12-24 ENCOUNTER — Other Ambulatory Visit: Payer: Self-pay | Admitting: Physical Medicine & Rehabilitation

## 2019-12-25 ENCOUNTER — Other Ambulatory Visit: Payer: Self-pay | Admitting: Physical Medicine & Rehabilitation

## 2019-12-26 ENCOUNTER — Other Ambulatory Visit: Payer: Self-pay

## 2019-12-26 MED ORDER — LOSARTAN POTASSIUM 25 MG PO TABS: 25.0000 mg | ORAL_TABLET | Freq: Every day | ORAL | 0 refills | Status: AC

## 2019-12-26 NOTE — Telephone Encounter (Signed)
Patient daughter has requested refill of Losartan  & Gabapentin. Please advise.

## 2019-12-26 NOTE — Addendum Note (Signed)
Addended by: Jasmine December T on: 12/26/2019 10:45 AM   Modules accepted: Orders

## 2019-12-26 NOTE — Telephone Encounter (Signed)
May reorder gabapentin and losartan 1 month supply

## 2020-01-24 ENCOUNTER — Other Ambulatory Visit: Payer: Self-pay | Admitting: Physical Medicine & Rehabilitation

## 2020-01-24 ENCOUNTER — Telehealth: Payer: Self-pay | Admitting: Neurology

## 2020-01-24 NOTE — Telephone Encounter (Signed)
Patient requested refills on her clopidogrel and atorvastatin.  She called the after-hours call center for this.  Upon my chart review, it looks like Dr. Letta Pate has prescribed this medication.  Patient that she was calling Dr. Letta Pate office as this is the number she had on file.  She is agreeable to calling Dr. Letta Pate office for a refill.

## 2020-01-25 ENCOUNTER — Other Ambulatory Visit: Payer: Self-pay | Admitting: Physical Medicine & Rehabilitation

## 2020-01-25 ENCOUNTER — Telehealth: Payer: Self-pay | Admitting: Neurology

## 2020-01-25 MED ORDER — ATORVASTATIN CALCIUM 80 MG PO TABS: 80.0000 mg | ORAL_TABLET | Freq: Every day | ORAL | 0 refills | Status: AC

## 2020-01-25 MED ORDER — CLOPIDOGREL BISULFATE 75 MG PO TABS
75.0000 mg | ORAL_TABLET | Freq: Every day | ORAL | 0 refills | Status: DC
Start: 2020-01-25 — End: 2020-03-20

## 2020-01-25 NOTE — Telephone Encounter (Signed)
Patient requested refill on generic Lipitor and clopidogrel.  Refill was refused by Dr. Letta Pate.  I renewed her prescription for Plavix and Lipitor for 1 month.  Please review and change to long-term prescription if patient is getting this from Dr. Leonie Man or Frann Rider, NP in our office.  I was not sure yesterday who the primary prescriber is.

## 2020-01-28 NOTE — Telephone Encounter (Signed)
These medications should be filled by PCP. Would advise her to further speak with PCP for additional refills.

## 2020-01-28 NOTE — Telephone Encounter (Signed)
I returned the call to the patient and instructed her to reach out to her PCP for refills. She verbalized understanding.

## 2020-02-22 ENCOUNTER — Other Ambulatory Visit: Payer: Self-pay | Admitting: Neurology

## 2020-02-27 ENCOUNTER — Other Ambulatory Visit: Payer: Self-pay | Admitting: Neurology

## 2020-03-20 ENCOUNTER — Ambulatory Visit (INDEPENDENT_AMBULATORY_CARE_PROVIDER_SITE_OTHER): Payer: 59 | Admitting: Adult Health

## 2020-03-20 ENCOUNTER — Encounter: Payer: Self-pay | Admitting: Adult Health

## 2020-03-20 VITALS — BP 157/91 | HR 84 | Ht 59.0 in | Wt 163.0 lb

## 2020-03-20 DIAGNOSIS — G8110 Spastic hemiplegia affecting unspecified side: Secondary | ICD-10-CM

## 2020-03-20 DIAGNOSIS — I639 Cerebral infarction, unspecified: Secondary | ICD-10-CM | POA: Diagnosis not present

## 2020-03-20 DIAGNOSIS — I69398 Other sequelae of cerebral infarction: Secondary | ICD-10-CM

## 2020-03-20 DIAGNOSIS — R269 Unspecified abnormalities of gait and mobility: Secondary | ICD-10-CM | POA: Diagnosis not present

## 2020-03-20 MED ORDER — CLOPIDOGREL BISULFATE 75 MG PO TABS: 75.0000 mg | ORAL_TABLET | Freq: Every day | ORAL | 3 refills | Status: AC

## 2020-03-20 NOTE — Progress Notes (Signed)
[facility] [address] [place]. [place] [address] [phone]       OFFICE FOLLOW-UP NOTE  Melissa. Melissa Holt Date of Birth:  Feb 01, 1941 Medical Record Number:  [id number]    Reason for visit: stroke f/u   Chief Complaint  Patient presents with  . Follow-up    TR with daughter (laverne) PT is having some LLE weakness with tremor and gait impairment as well as decreased LUE coordination      HPI:   Today, 03/20/2020, Melissa Holt returns for 37-month stroke follow-up accompanied by her daughter.  Stable from a stroke standpoint since prior visit without new stroke/TIA symptoms and reports residual left-sided weakness and gait impairment.  Ambulates with rolling walker and denies any recent falls.  She has since completed home health therapies but is interested in additional therapy.  Her daughter continues to live with her and assist with ADLs and majority of IADLs.  She has remained on Plavix and atorvastatin for secondary stroke prevention of side effects.  Blood pressure today 157/91.  No further concerns at this time.   History provided for reference purposes only Initial visit 12/12/2019 Dr. Leonie Man: Melissa Holt is a 80 year old African-American lady seen today for initial office follow-up visit following consultation for stroke in August 2021.  She is accompanied by her daughter.  History is obtained from them, review of electronic medical records and I have personally reviewed available imaging films in PACS.  She has a past medical history of colon cancer, hypertension, hyperlipidemia who presented to [facility] on 10/06/2019 with several days history of difficulty walking with legs giving out in the bathroom especially left leg.  She initially had an MRI on 10/05/2019 which did not show any acute stroke.  She returned back with falls and leg weakness and follow-up MRI on 10/08/2019 showed a right medullary ventral infarct.  CT angiogram showed no flow in the  right vertebral artery with multifocal intracranial atherosclerotic changes with poor flow in V3 and V4 segments of the right vertebral artery likely due to high-grade stenosis or occlusion.  There were moderate to severe stenosis noted of the cavernous and paraclinoid right ICA as well as proximal A1 segment of the anterior cerebral artery bilaterally..  2D echo showed normal ejection fraction.  LDL cholesterol elevated 186 mg percent and hemoglobin A1c was 5.8.  Patient was transferred to inpatient rehab and progressed well and was discharged home on 10/26/2019.  She is since getting home physical and occupational therapy.  She has been walking with a walker.  She still has left-sided weakness and stiffness.  She has had no falls or injuries.  She was on aspirin Plavix for 3 weeks and is currently on Plavix alone which is tolerating well without bruising or bleeding.  She remains on Lipitor which is tolerating well without muscle aches and pains.  She states her blood pressure at home is well controlled though it is elevated in office today at 157/72.  Her daughter has moved in with her and patient needs assistance with activities like going to the bathroom, cooking and she is not driving.   ROS:   14 system review of systems is positive for those listed in HPI and all others negative   PMH:  Past Medical History:  Diagnosis Date  . Cancer [facility])     Social History:  Social History   Socioeconomic History  . Marital status: Widowed    Spouse name: Not on file  . Number  of children: Not on file  . Years of education: Not on file  . Highest education level: Not on file  Occupational History  . Not on file  Tobacco Use  . Smoking status: Never Smoker  . Smokeless tobacco: Current User    Types: Snuff  Vaping Use  . Vaping Use: Never used  Substance and Sexual Activity  . Alcohol use: Yes  . Drug use: Not Currently  . Sexual activity: Not Currently  Other Topics Concern  . Not on file   Social History Narrative   Lives alone   Right Handed   Drinks 1-2 cups caffeine daily   Social Determinants of Health   Financial Resource Strain: Not on file  Food Insecurity: Not on file  Transportation Needs: Not on file  Physical Activity: Not on file  Stress: Not on file  Social Connections: Not on file  Intimate Partner Violence: Not on file    Medications:   Current Outpatient Medications on File Prior to Visit  Medication Sig Dispense Refill  . acetaminophen (TYLENOL) 325 MG tablet Take 1-2 tablets (325-650 mg total) by mouth every 4 (four) hours as needed for mild pain.    Marland Kitchen atorvastatin (LIPITOR) 80 MG tablet Take 1 tablet (80 mg total) by mouth daily. 30 tablet 0  . gabapentin (NEURONTIN) 300 MG capsule Take 1 capsule by mouth once daily at bedtime 30 capsule 0  . losartan (COZAAR) 25 MG tablet Take 1 tablet (25 mg total) by mouth daily. 30 tablet 0   No current facility-administered medications on file prior to visit.    Allergies:  No Known Allergies  Physical Exam Today's Vitals   03/20/20 1006  BP: (!) 157/91  Pulse: 84  Weight: 163 lb (73.9 kg)  Height: 4\' 11"  (1.499 m)   Body mass index is 32.92 kg/m.   General: Frail pleasant elderly African-American lady, seated, in no evident distress Head: head normocephalic and atraumatic.  Neck: supple with no carotid or supraclavicular bruits Cardiovascular: regular rate and rhythm, no murmurs Musculoskeletal: Mild kyphoscoliosis Skin:  no rash/petichiae Vascular:  Normal pulses all extremities Respiratory system bilateral wheezing heard.  Neurologic Exam Mental Status: Awake and fully alert.  Fluent speech and language.  Oriented to place and time. Recent and remote memory intact. Attention span, concentration and fund of knowledge appropriate. Mood and affect appropriate.  Cranial Nerves: Pupils equal, briskly reactive to light. Extraocular movements full without nystagmus. Visual fields full to  confrontation. Hearing intact. Facial sensation intact. Face, tongue, palate moves normally and symmetrically.  Motor: Spastic left hemiparesis with 4/5 strength with weakness of left grip and intrinsic hand muscles as well as left hip flexor and ankle dorsiflexion.  Tone is increased on the left compared to the right.  Normal strength on the right. Sensory.: intact to touch ,pinprick .position and vibratory sensation.  Coordination: Finger-to-nose and heel-to-shin coordination impaired on the left. Gait and Station: Arises from chair with  difficulty. Stance is slightly hunched. Gait demonstrates spastic hemiparesis with dragging of the left foot using a wheeled walker.   reflexes: 2+ and asymmetric and brisker on the left. Toes downgoing.       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  Vascular risk factors of hypertension, hyperlipidemia, intracranial occlusive disease.    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  Elevated today on losartan per PCP.  Encouraged monitoring at home and follow-up with PCP if remains elevated or uncontrolled -HLD: LDL goal<70.  On atorvastatin 80 mg daily.  Request follow-up with PCP in the next 1 to 2 months for repeat lipid panel as patient currently not fasting today as well as ongoing  prescribing of atorvastatin.   Per daughter request, f/u on an as-needed basis due to length of drive as they are from [place], [place]. advised to call with any questions or concerns regarding prior stroke and to ensure routine follow-up with PCP for aggressive stroke risk factor management and prescribing of all above medications    CC:  [facility] provider: Dr. Jetty Peeks, Annie Main, MD   I spent 30 minutes of face-to-face and non-face-to-face time with patient and daughter.  This included previsit chart review, lab review, study review, order entry, electronic health record documentation, patient and daughter education and discussion regarding history of prior stroke, residual deficits, importance of managing stroke risk factors and answered all other questions to patient and daughter satisfaction  Frann Rider, AGNP-BC  [facility] [address] [place], [place] [address]  Phone [phone] Fax [phone] Note: This document was prepared with digital dictation and possible smart phrase technology. Any transcriptional errors that result from this process are unintentional.

## 2020-03-20 NOTE — Progress Notes (Signed)
I agree with the above plan 

## 2020-03-20 NOTE — Patient Instructions (Signed)
Recommend further physical and Occupational Therapy -referral will be placed  Continue clopidogrel 75 mg daily  and atorvastatin 80 mg daily for secondary stroke prevention Plavix refill provided but request additional refills be obtained by PCP  Continue to follow up with PCP regarding cholesterol and blood pressure management  Maintain strict control of hypertension with blood pressure goal below 130/90 and cholesterol with LDL cholesterol (bad cholesterol) goal below 70 mg/dL.        Thank you for coming to see [place] at [facility]. I hope we have been able to provide you high quality care today.  You may receive a patient satisfaction survey over the next few weeks. We would appreciate your feedback and comments so that we may continue to improve ourselves and the health of our patients.

## 2020-09-17 ENCOUNTER — Ambulatory Visit: Payer: 59 | Admitting: Neurology

## 2020-10-14 ENCOUNTER — Ambulatory Visit (INDEPENDENT_AMBULATORY_CARE_PROVIDER_SITE_OTHER): Payer: 59 | Admitting: Adult Health

## 2020-10-14 ENCOUNTER — Encounter: Payer: Self-pay | Admitting: Adult Health

## 2020-10-14 VITALS — BP 152/87 | HR 77 | Ht 59.0 in | Wt 170.0 lb

## 2020-10-14 DIAGNOSIS — G8110 Spastic hemiplegia affecting unspecified side: Secondary | ICD-10-CM

## 2020-10-14 DIAGNOSIS — I639 Cerebral infarction, unspecified: Secondary | ICD-10-CM | POA: Diagnosis not present

## 2020-10-14 DIAGNOSIS — M21372 Foot drop, left foot: Secondary | ICD-10-CM | POA: Diagnosis not present

## 2020-10-14 DIAGNOSIS — I69398 Other sequelae of cerebral infarction: Secondary | ICD-10-CM

## 2020-10-14 DIAGNOSIS — R269 Unspecified abnormalities of gait and mobility: Secondary | ICD-10-CM

## 2020-10-14 NOTE — Progress Notes (Signed)
[facility] [address] [place]. [place] [address] [phone]       OFFICE FOLLOW-UP NOTE  [name] Date of Birth:  [birth year] Medical Record Number:  [id number]    Reason for visit: stroke f/u   Chief Complaint  Patient presents with   Follow-up    RM 3 with daughter laverne  Pt is well, L leg is still dragging and has gotten worse.        HPI:   Today, 10/14/2020, [name] returns for stroke follow up accompanied by daughter, Melissa Holt.  Concerned regarding continued left leg weakness interfering with gait and limited mobility.  Denies actual worsening, just no improvement.  Completed therapies a couple of weeks ago (taking a break til Nov/early Dec) with improvement per daughter. Since completion, continues to do exercises daily. Tries to keep active but feels like gait worsens with increased exertion. Ambulates with RW. Daughter uses Rollator walker to push her around at appts and when going out as she has greater difficulty with longer distance.  She previously had w/c but no longer in working condition. Also reports right hand pain upon awakening and resolves soon after awakening present over the past 2-3 months. Mild residual left arm weakness overall greatly improved.  Denies new stroke/TIA symptoms. Compliant on Plavix and atorvastatin without side effects.  Blood pressure today 152/87.  Has follow-up with PCP in November with plans on obtaining lab work.  No further concerns at this time.    History provided for reference purposes only Update 03/20/2020 JM: Ms Holt returns for 80-monthstroke follow-up accompanied by her daughter.  Stable from a stroke standpoint since prior visit without new stroke/TIA symptoms and reports residual left-sided weakness and gait impairment.  Ambulates with rolling walker and denies any recent falls.  She has since completed home health therapies but is interested in additional therapy.  Her daughter continues to  live with her and assist with ADLs and majority of IADLs.  She has remained on Plavix and atorvastatin for secondary stroke prevention of side effects.  Blood pressure today 157/91.  No further concerns at this time.  Initial visit 12/12/2019 [name] Melissa Holt an 80-year old African-American lady seen today for initial office follow-up visit following consultation for stroke in August 2021.  She is accompanied by her daughter.  History is obtained from them, review of electronic medical records and I have personally reviewed available imaging films in PACS.  She has a past medical history of colon cancer, hypertension, hyperlipidemia [facility] several days history of difficulty walking with legs giving out in the bathroom especially left leg.  She initially had an MRI on 10/05/2019 which did not show any acute stroke.  She returned back with falls and leg weakness and follow-up MRI on 10/08/2019 showed a right medullary ventral infarct.  CT angiogram showed no flow in the right vertebral artery with multifocal intracranial atherosclerotic changes with poor flow in V3 and V4 segments of the right vertebral artery likely due to high-grade stenosis or occlusion.  There were moderate to severe stenosis noted of the cavernous and paraclinoid right ICA as well as proximal A1 segment of the anterior cerebral artery bilaterally..  2D echo showed normal ejection fraction.  LDL cholesterol elevated 186 mg percent and hemoglobin A1c was 5.8.  Patient was transferred to inpatient rehab and progressed well and was discharged home on 10/26/2019.  She is since getting home physical and  occupational therapy.  She has been walking with a walker.  She still has left-sided weakness and stiffness.  She has had no falls or injuries.  She was on aspirin Plavix for 3 weeks and is currently on Plavix alone which is tolerating well without bruising or bleeding.  She remains on Lipitor which is  tolerating well without muscle aches and pains.  She states her blood pressure at home is well controlled though it is elevated in office today at 157/72.  Her daughter has moved in with her and patient needs assistance with activities like going to the bathroom, cooking and she is not driving.   ROS:   14 system review of systems is positive for those listed in HPI and all others negative   PMH:  Past Medical History:  Diagnosis Date   Cancer [facility])     Social History:  Social History   Socioeconomic History   Marital status: Widowed    Spouse name: Not on file   Number of children: Not on file   Years of education: Not on file   Highest education level: Not on file  Occupational History   Not on file  Tobacco Use   Smoking status: Never   Smokeless tobacco: Current    Types: Snuff  Vaping Use   Vaping Use: Never used  Substance and Sexual Activity   Alcohol use: Yes   Drug use: Not Currently   Sexual activity: Not Currently  Other Topics Concern   Not on file  Social History Narrative   Lives alone   Right Handed   Drinks 1-2 cups caffeine daily   Social Determinants of Health   Financial Resource Strain: Not on file  Food Insecurity: Not on file  Transportation Needs: Not on file  Physical Activity: Not on file  Stress: Not on file  Social Connections: Not on file  Intimate Partner Violence: Not on file    Medications:   Current Outpatient Medications on File Prior to Visit  Medication Sig Dispense Refill   acetaminophen (TYLENOL) 325 MG tablet Take 1-2 tablets (325-650 mg total) by mouth every 4 (four) hours as needed for mild pain.     atorvastatin (LIPITOR) 80 MG tablet Take 1 tablet (80 mg total) by mouth daily. 30 tablet 0   clopidogrel (PLAVIX) 75 MG tablet Take 1 tablet (75 mg total) by mouth daily. 90 tablet 3   gabapentin (NEURONTIN) 300 MG capsule Take 1 capsule by mouth once daily at bedtime 30 capsule 0   losartan (COZAAR) 25 MG tablet Take 1  tablet (25 mg total) by mouth daily. 30 tablet 0   No current facility-administered medications on file prior to visit.    Allergies:  No Known Allergies  Physical Exam Today's Vitals   10/14/20 1525  BP: (!) 152/87  Pulse: 77  Weight: 170 lb (77.1 kg)  Height: '4\' 11"'$  (1.499 m)    Body mass index is 34.34 kg/m.  General: Frail very pleasant elderly African-American lady, seated, in no evident distress Head: head normocephalic and atraumatic.  Neck: supple with no carotid or supraclavicular bruits Cardiovascular: regular rate and rhythm, no murmurs Musculoskeletal: Mild kyphoscoliosis Skin:  no rash/petichiae Vascular:  Normal pulses all extremities  Neurologic Exam Mental Status: Awake and fully alert.  Fluent speech and language.  Oriented to place and time. Recent and remote memory intact. Attention span, concentration and fund of knowledge appropriate. Mood and affect appropriate.  Cranial Nerves: Pupils equal, briskly reactive to light.  Extraocular movements full without nystagmus. Visual fields full to confrontation. Hearing intact. Facial sensation intact. Face, tongue, palate moves normally and symmetrically.  Motor: Full strength and tone right upper and lower extremity.   LUE: 5/5 proximal and 4+/5 grip strength  LLE: 4-/5 hip flexor and KF, 4+/5 hip abduction/adduction and KE, and foot drop with 2/5 ADF, 3/5 APF spastic left hemiparesis with 4/5 strength with weakness of left grip and intrinsic hand muscles as well as left hip flexor and ankle dorsiflexion.  Tone is increased on the left compared to the right.  Normal strength on the right. Sensory.: intact to touch ,pinprick .position and vibratory sensation.  Coordination: Finger-to-nose and heel-to-shin coordination impaired on the left. Gait and Station: Arises from chair with moderate difficulty. Stance is slightly hunched. Gait demonstrates slow spastic hemiparesis with dragging of the left foot. Only able to take  a couple steps. reflexes: 2+ and asymmetric and brisker on the left. Toes downgoing.       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  Vascular risk factors of hypertension, hyperlipidemia, intracranial occlusive disease.    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 AFO brace will decrease fall risk and improve overall ambulation  -DME order placed for lightweight transport wheelchair as she has difficulty ambulating long distance increasing risk of falls  -continue HEP with plans on restarting PT/OT Nov/Dec. Continue use of RW at all times -Continue Plavix and atorvastatin 80 mg daily for secondary stroke prevention -Discussed secondary stroke prevention measures and importance of close PCP follow-up for aggressive stroke risk factor management -HTN: BP goal<130/90.  Stable on current regimen per PCP -HLD: LDL goal<70.  On atorvastatin 80 mg daily. Plans to repeat lipid panel with PCP in November. Previously completed which was satisfactory per daughter (unable to personally view via epic)    Per daughter request, f/u on an as-needed basis due to length of drive as they are from [place], [place]. advised to call with any questions or concerns regarding prior stroke and to ensure routine follow-up with PCP for aggressive stroke risk factor management and prescribing of all above medications    CC:  [facility] provider: Dr. Jetty Peeks, Annie Main, MD   I spent 32 minutes of face-to-face and non-face-to-face time with patient and daughter.  This included previsit chart review, lab review, study review, order entry, electronic health record documentation, patient and daughter education and  discussion regarding history of prior stroke, residual deficits, secondary stroke prevention measures and importance of managing stroke risk factors and answered all other questions to patient and daughter satisfaction  Frann Rider, AGNP-BC  [facility] [address] [place], [place] [address]  Phone [phone] Fax [phone] Note: This document was prepared with digital dictation and possible smart phrase technology. Any transcriptional errors that result from this process are unintentional.

## 2020-10-14 NOTE — Patient Instructions (Signed)
Referral placed to obtain an AFO brace for left sided foot weakness - you will be called to set up fitting appointment. If you do not hear from them in 1-2 weeks, please call us and let us know  Continue clopidogrel 75 mg daily  and atorvastatin '80mg'$  daily  for secondary stroke prevention  Continue to follow up with PCP regarding cholesterol and blood pressure management  Maintain strict control of hypertension with blood pressure goal below 130/90 and cholesterol with LDL cholesterol (bad cholesterol) goal below 70 mg/dL.         Thank you for coming to see [place] at [facility]. I hope we have been able to provide you high quality care today.  You may receive a patient satisfaction survey over the next few weeks. We would appreciate your feedback and comments so that we may continue to improve ourselves and the health of our patients.

## 2020-10-16 ENCOUNTER — Telehealth: Payer: Self-pay

## 2020-10-16 NOTE — Telephone Encounter (Signed)
PHONE RM CAN RELAY  Contacted daughter [name] per Janett Billow, VM full, to inform her that the order for a new wheelchair was placed but, per our DME, This patient has received an wheelchair on 10/25/2019 and is not eligible for a another one for 5 years and would recommend her call the office where she received it from to inform them of wheelchair no longer being functional.    Contacted the pt, LVM informing her of this as well.

## 2020-10-21 NOTE — Progress Notes (Signed)
I agree with the above plan 

## 2020-12-18 ENCOUNTER — Telehealth: Payer: Self-pay | Admitting: Adult Health

## 2020-12-18 DIAGNOSIS — M21372 Foot drop, left foot: Secondary | ICD-10-CM

## 2020-12-18 DIAGNOSIS — G8110 Spastic hemiplegia affecting unspecified side: Secondary | ICD-10-CM

## 2020-12-18 DIAGNOSIS — I69398 Other sequelae of cerebral infarction: Secondary | ICD-10-CM

## 2020-12-18 NOTE — Telephone Encounter (Signed)
Contacted daughter back, informed her of my message from Sept 8 when I tried to contact her regarding the wheelchair.  Also, asked if she was given a print out of DME order to take to local DME in [place] and she wasn't sure. Advised to find a local DME there that they want to use and I will try to contact them for a fax number and will get new orders. She will call me back when she has that info.

## 2020-12-18 NOTE — Telephone Encounter (Signed)
Pt's daughter, Luz Lex (on [place]) called, have not received a call to schedule fitting for leg brace. Also checking if order has been sent for hospital bed. Would like a call from the nurse.

## 2021-01-22 NOTE — Telephone Encounter (Signed)
Pt's daughter, Melissa Holt calling back with info requested.  Melissa Holt  phone [phone]

## 2021-01-22 NOTE — Telephone Encounter (Signed)
Contacted daughter back, informed her Melissa Holt is out office and a new order will need to be placed for leg brace and signed since it has been over a month. She understood and was appreciative.

## 2021-01-26 NOTE — Telephone Encounter (Signed)
New order placed as requested. Thank you

## 2021-01-26 NOTE — Addendum Note (Signed)
Addended by: Frann Rider L on: 01/26/2021 04:40 PM   Modules accepted: Orders

## 2021-01-26 NOTE — Telephone Encounter (Signed)
Contacted daughter, informed her order has been faxed to [facility]. Fax number provided by their staff [phone]. She was appreciative.

## 2021-02-18 NOTE — Telephone Encounter (Signed)
Pt's daughter checked with the [facility] just this morning re: the anke/leg brace and she was told they have not received anything.  Daughter is asking for a call with an update.

## 2021-02-18 NOTE — Telephone Encounter (Signed)
Contacted clinic for verification, it was a misunderstanding on there end, DOB was not correct. Order was received.

## 2021-02-18 NOTE — Telephone Encounter (Signed)
PHONE RM CAN RELAY IF DAUGHTER CALLS BACK  Contacted daughter, VM full.  I Contacted clinic for verification, it was a misunderstanding on there end, DOB was not correct. Order was received.

## 2021-03-09 ENCOUNTER — Other Ambulatory Visit: Payer: Self-pay | Admitting: Neurology

## 2021-08-07 IMAGING — MR MR CERVICAL SPINE W/O CM
5 series · 41 of 48 positions shown · non-contrast
Comparison: None.

CLINICAL DATA: Left upper and lower extremity

EXAM:
MRI CERVICAL SPINE WITHOUT CONTRAST
TECHNIQUE: Multiplanar, multisequence MR imaging of the cervical spine was
performed. No intravenous contrast was administered.

[Series 13: T2 · sagittal · 3.0mm · 0.66mm/px · 6 of 15 slices shown (1 of 2)]
[im 1/15]
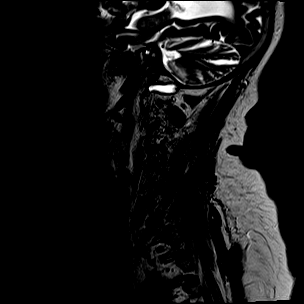
[im 3/15]
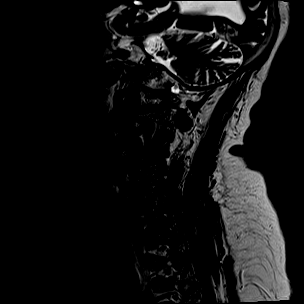
[im 6/15]
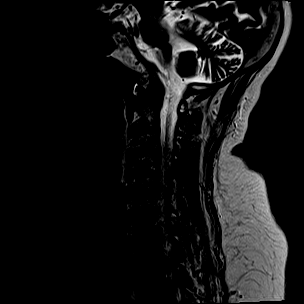
[im 9/15]
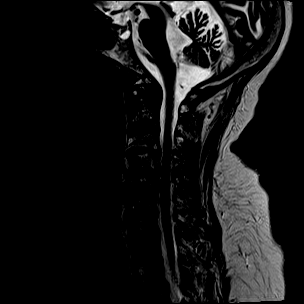
[im 12/15]
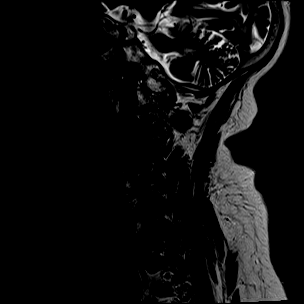
[im 15/15]
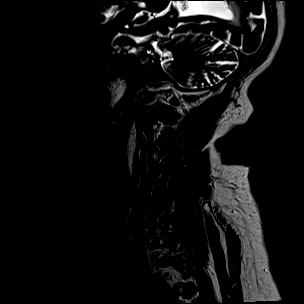

[Series 14: T1 · sagittal · 3.0mm · 0.66mm/px · 7 of 15 slices shown]
[im 1/15]
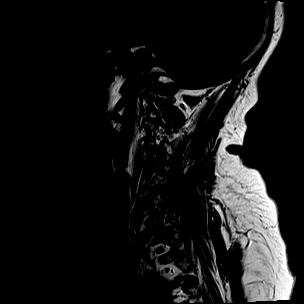
[im 3/15]
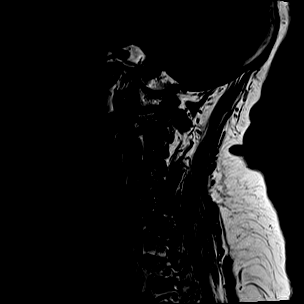
[im 5/15]
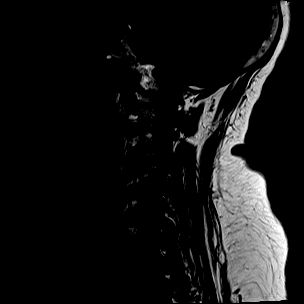
[im 8/15]
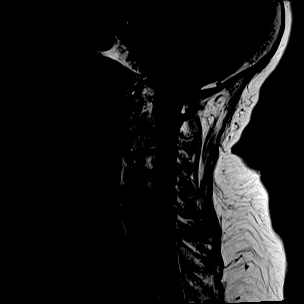
[im 10/15]
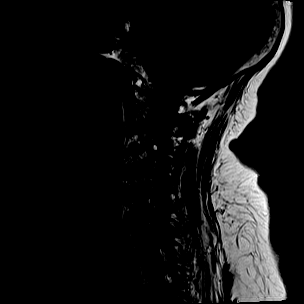
[im 12/15]
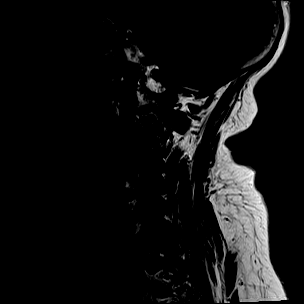
[im 15/15]
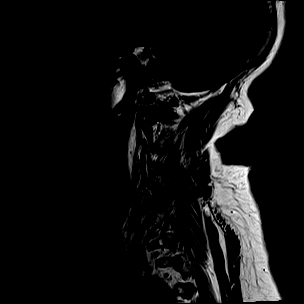

[Series 15: STIR · sagittal · 3.0mm · 0.89mm/px · 7 of 15 slices shown]
[im 1/15]
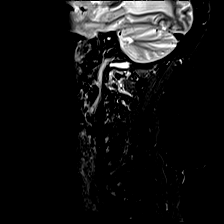
[im 3/15]
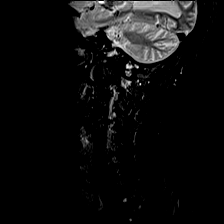
[im 5/15]
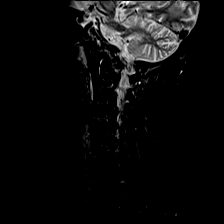
[im 8/15]
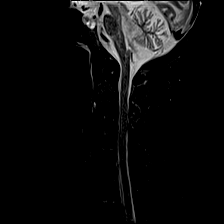
[im 10/15]
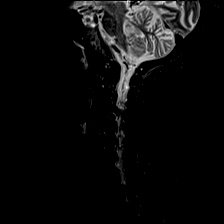
[im 12/15]
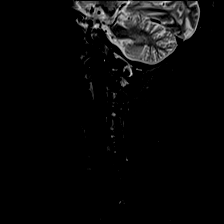
[im 15/15]
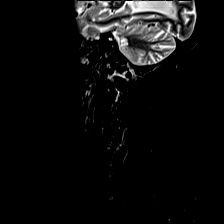

[Series 16: T2 · axial · 3.0mm · 0.66mm/px · z∈[-260,-162]mm · 13 of 31 slices shown (2 of 2)]
[im 1/31]
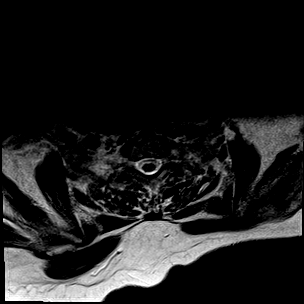
[im 3/31]
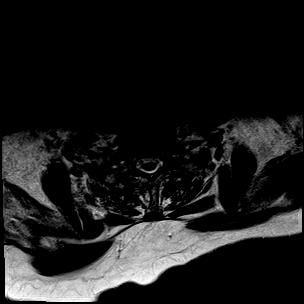
[im 5/31]
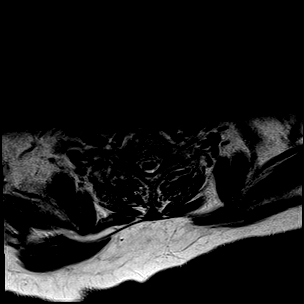
[im 7/31]
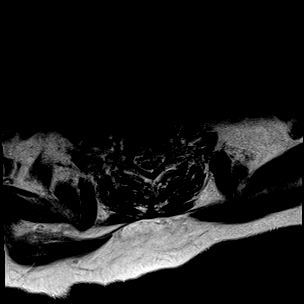
[im 10/31]
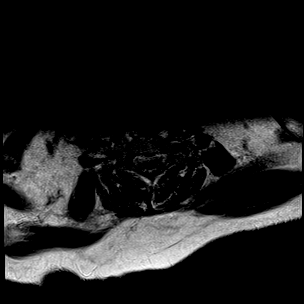
[im 12/31]
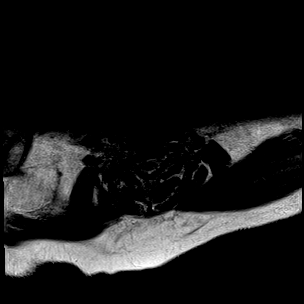
[im 14/31]
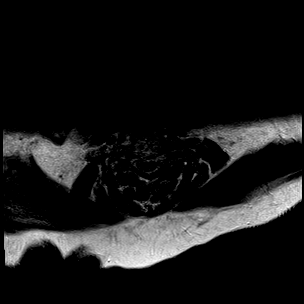
[im 17/31]
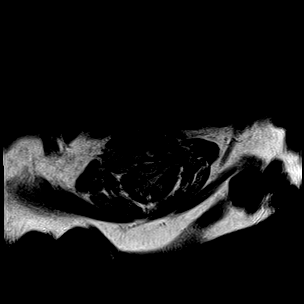
[im 19/31]
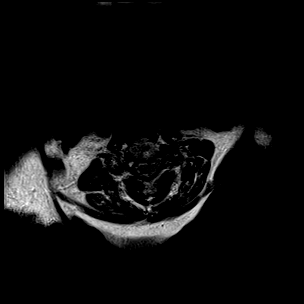
[im 21/31]
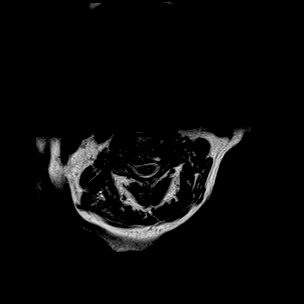
[im 24/31]
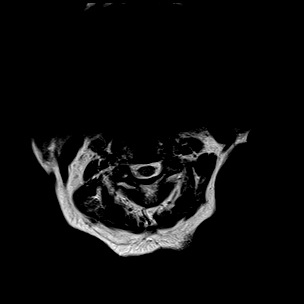
[im 26/31]
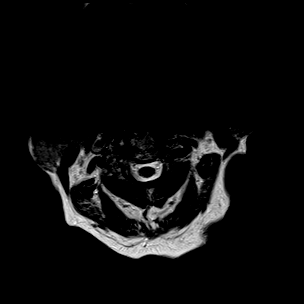
[im 31/31]
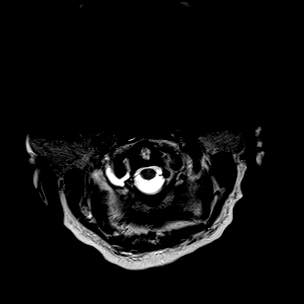

[Series 17: GRE · axial · 3.0mm · 0.39mm/px · z∈[-260,-162]mm · 8 of 32 slices shown]
[im 1/32]
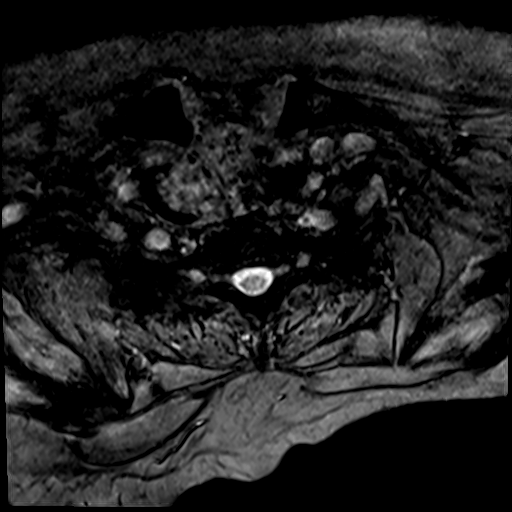
[im 5/32]
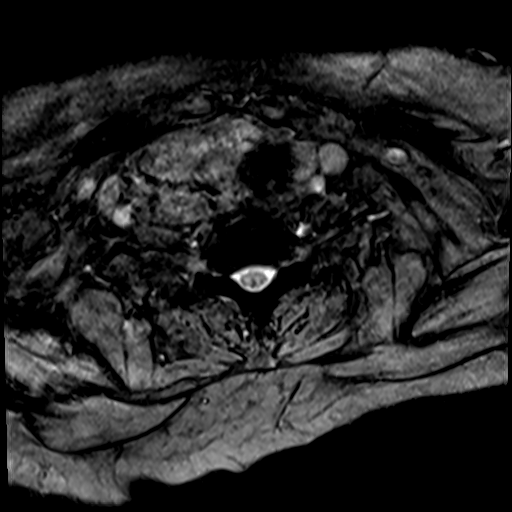
[im 10/32]
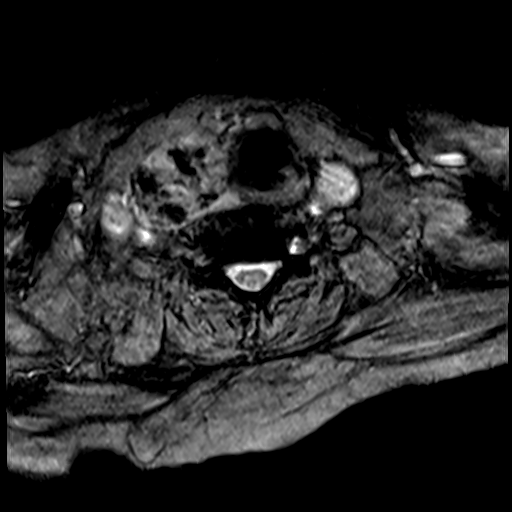
[im 15/32]
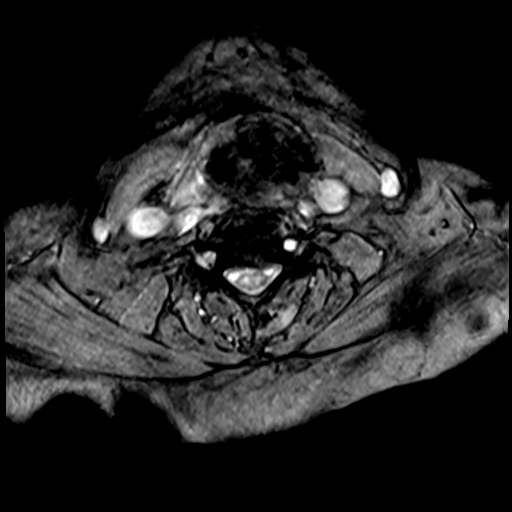
[im 17/32]
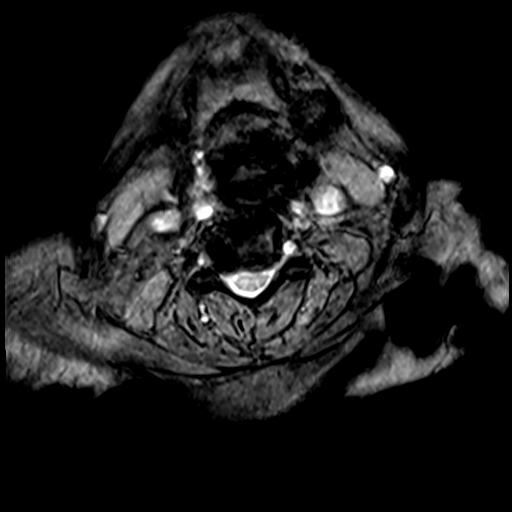
[im 22/32]
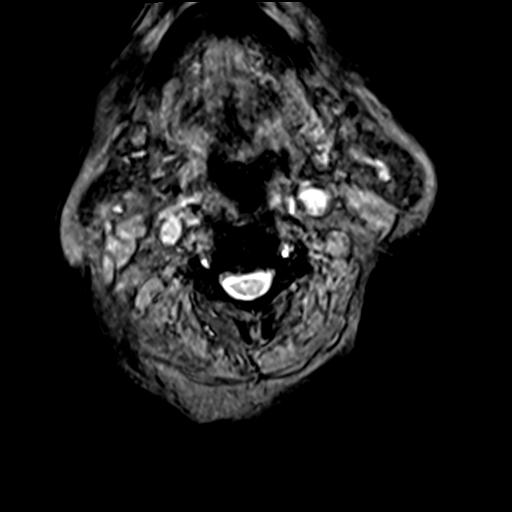
[im 27/32]
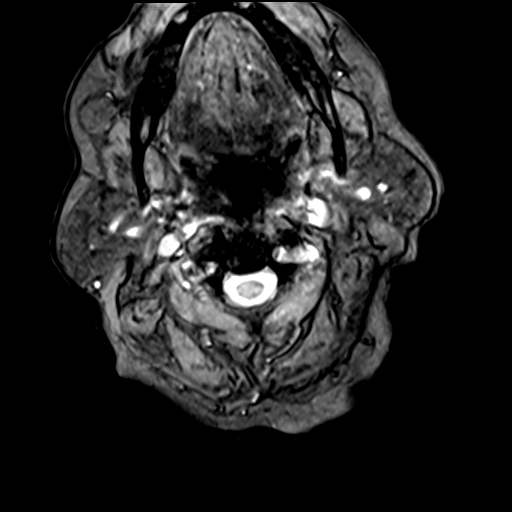
[im 32/32]
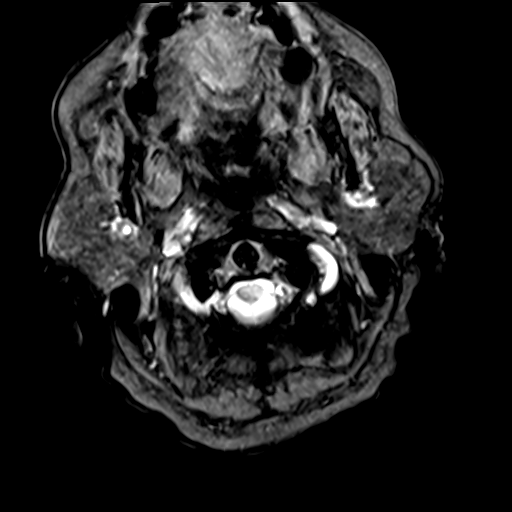

[41 of 48 positions shown; findings below may reference images not displayed]

FINDINGS: Alignment: Physiologic.

Vertebrae: Mildly heterogeneous marrow signal.  Acute abnormality.

Cord: Normal

Posterior Fossa, vertebral arteries, paraspinal tissues: Abnormal
right vertebral artery flow void.

Disc levels:

C1-2: Unremarkable.

C2-3: Left-greater-than-right facet hypertrophy. Small disc bulge.
There is no spinal canal stenosis. No neural foraminal stenosis.

C3-4: Intermediate sized disc bulge with bilateral large
uncovertebral osteophytes. Mild spinal canal stenosis. Severe
bilateral neural foraminal stenosis.

C4-5: Right-greater-than-left uncovertebral hypertrophy with
intermediate disc bulge. Mild spinal canal stenosis. Severe right
and moderate left neural foraminal stenosis.

C5-6: Uncovertebral hypertrophy with small disc bulge. There is no
spinal canal stenosis. Moderate bilateral neural foraminal stenosis.

C6-7: Broad disc bulge with bilateral uncovertebral hypertrophy.
There is no spinal canal stenosis. Severe bilateral neural foraminal
stenosis.

C7-T1: Small disc bulge small bilateral uncovertebral osteophytes.
There is no spinal canal stenosis. Severe bilateral neural foraminal
stenosis.
IMPRESSION: 1. Severe bilateral C4, C7 and C8 neural foraminal stenosis.
2. Mild spinal canal stenosis at C3-4 and C4-5.
3. Moderate bilateral C6 neural foraminal stenosis.

## 2021-08-12 IMAGING — DX DG CHEST 1V PORT
1 series · 1 of 1 positions shown · non-contrast
Comparison: Thoracic spine CT dated 09/28/2019

CLINICAL DATA: Leukocytosis.

EXAM:
PORTABLE CHEST 1 VIEW

[chest ap]
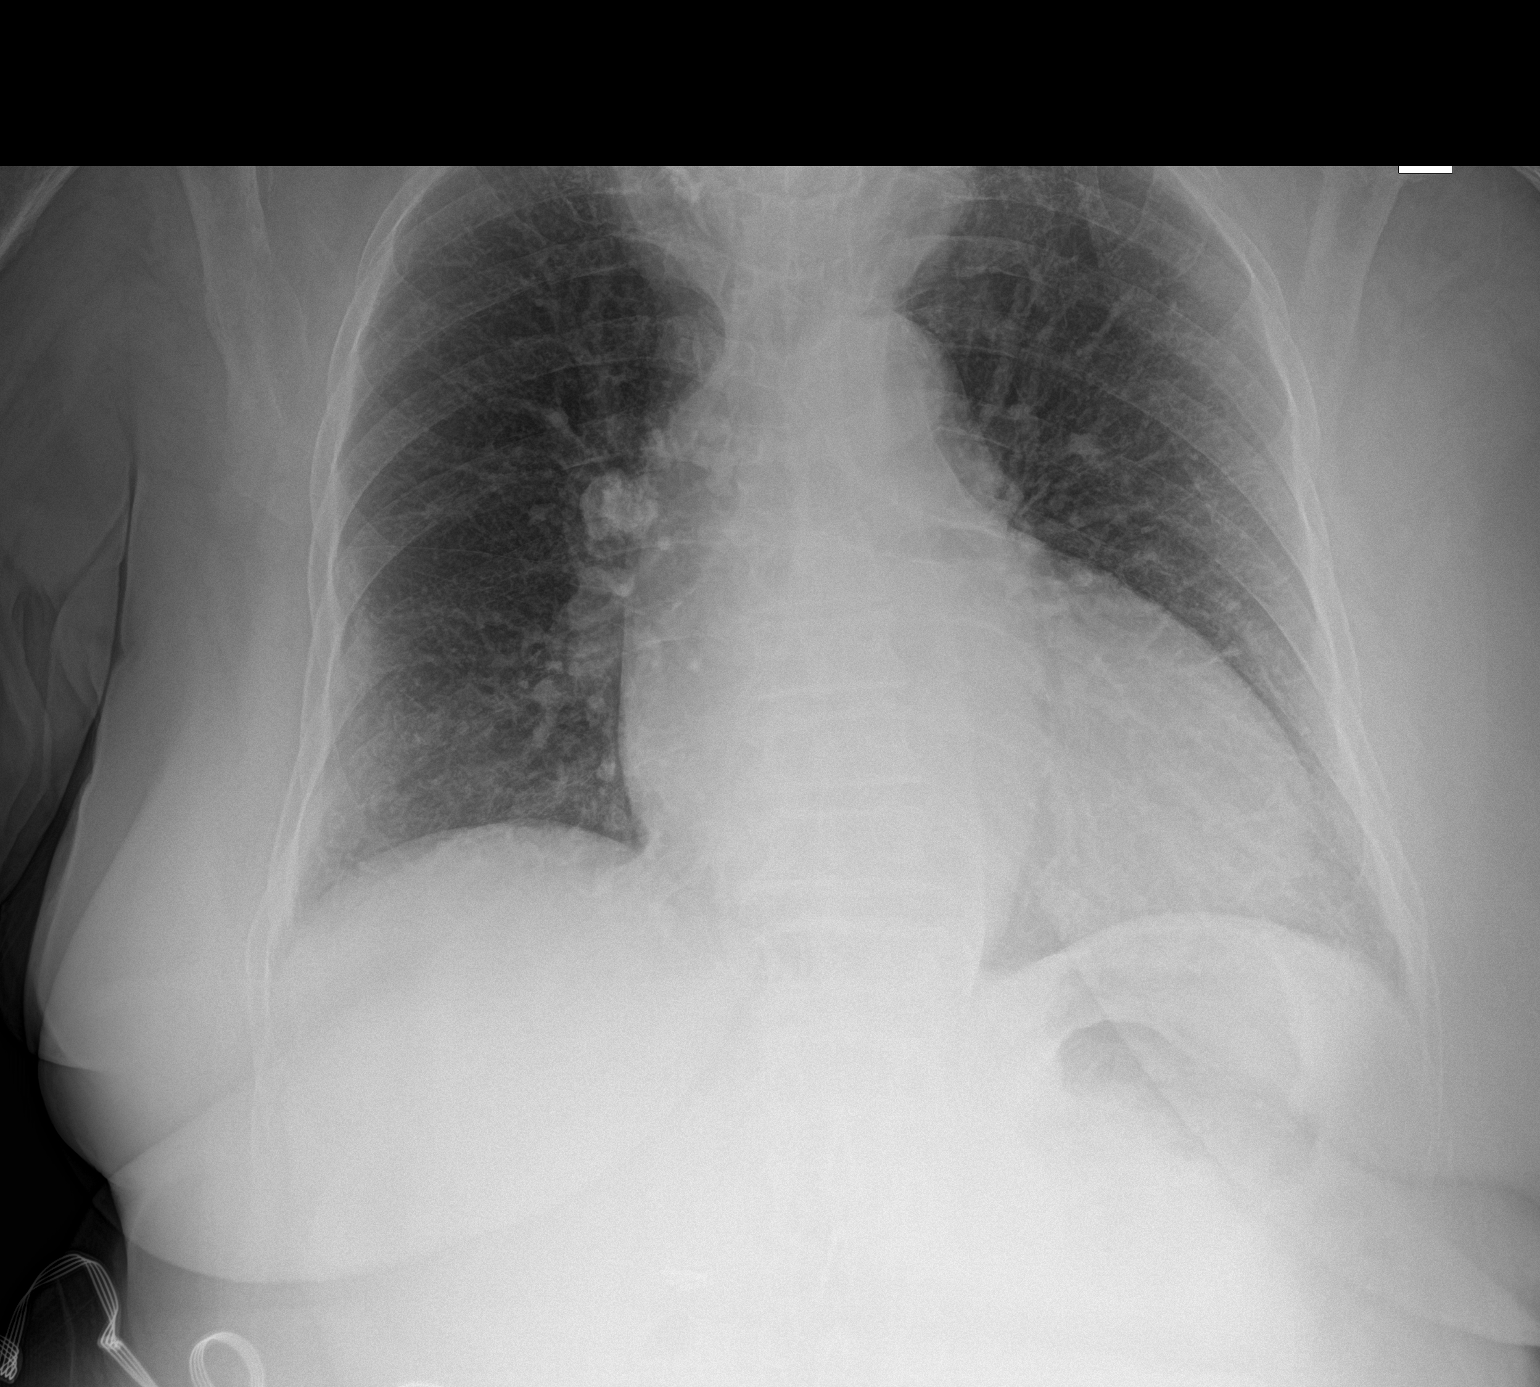

[1 of 1 positions shown; findings below may reference images not displayed]

FINDINGS: Mild enlargement of the cardiopericardial silhouette. Masslike
opacity in the right superior mediastinum deviates the trachea to
the left. No other mediastinal masses. No hilar masses or enlarged
lymph nodes. There are calcified right hilar lymph nodes.

Lungs are clear. No convincing pleural effusion and no pneumothorax.

Skeletal structures are demineralized, but grossly intact.
IMPRESSION: 1. No acute cardiopulmonary disease.
2. Cardiomegaly. Enlarged thyroid deviating the trachea to the left.
Healed granulomatous disease with calcified right hilar lymph nodes.

## 2023-10-10 DEATH — deceased
